# Patient Record
Sex: Male | Born: 1945
Health system: Southern US, Community
[De-identification: ages and names within clinical notes are randomized; demographics above are authoritative.]

## PROBLEM LIST (undated history)

## (undated) DIAGNOSIS — I10 Essential (primary) hypertension: Secondary | ICD-10-CM

## (undated) DIAGNOSIS — D649 Anemia, unspecified: Secondary | ICD-10-CM

## (undated) DIAGNOSIS — D61818 Other pancytopenia: Secondary | ICD-10-CM

## (undated) DIAGNOSIS — R748 Abnormal levels of other serum enzymes: Secondary | ICD-10-CM

## (undated) DIAGNOSIS — D696 Thrombocytopenia, unspecified: Secondary | ICD-10-CM

## (undated) DIAGNOSIS — Z8781 Personal history of (healed) traumatic fracture: Secondary | ICD-10-CM

## (undated) DIAGNOSIS — N183 Chronic kidney disease, stage 3 unspecified: Secondary | ICD-10-CM

## (undated) DIAGNOSIS — C801 Malignant (primary) neoplasm, unspecified: Secondary | ICD-10-CM

## (undated) DIAGNOSIS — F4310 Post-traumatic stress disorder, unspecified: Secondary | ICD-10-CM

## (undated) HISTORY — DX: Personal history of (healed) traumatic fracture: Z87.81

## (undated) HISTORY — DX: Abnormal levels of other serum enzymes: R74.8

## (undated) HISTORY — DX: Other pancytopenia: D61.818

## (undated) HISTORY — DX: Anemia, unspecified: D64.9

## (undated) HISTORY — DX: Post-traumatic stress disorder, unspecified: F43.10

## (undated) HISTORY — DX: Thrombocytopenia, unspecified: D69.6

## (undated) HISTORY — PX: NO PAST SURGERIES: SHX2092

---

## 2009-09-19 ENCOUNTER — Inpatient Hospital Stay: Payer: Self-pay | Admitting: Internal Medicine

## 2012-07-29 LAB — BASIC METABOLIC PANEL
Anion Gap: 11 (ref 7–16)
BUN: 10 mg/dL (ref 7–18)
Chloride: 101 mmol/L (ref 98–107)
Co2: 22 mmol/L (ref 21–32)
Creatinine: 1.12 mg/dL (ref 0.60–1.30)
EGFR (Non-African Amer.): >60
Sodium: 134 mmol/L — ABNORMAL LOW (ref 136–145)

## 2012-07-29 LAB — CBC
HCT: 43.9 % (ref 40.0–52.0)
MCV: 79 fL — ABNORMAL LOW (ref 80–100)
Platelet: 183 10*3/uL (ref 150–440)
RBC: 5.57 10*6/uL (ref 4.40–5.90)
RDW: 16.3 % — ABNORMAL HIGH (ref 11.5–14.5)
WBC: 6 10*3/uL (ref 3.8–10.6)

## 2012-07-29 LAB — CK: CK, Total: 243 U/L — ABNORMAL HIGH (ref 35–232)

## 2012-07-30 ENCOUNTER — Observation Stay: Payer: Self-pay | Admitting: Surgery

## 2012-08-08 ENCOUNTER — Ambulatory Visit: Payer: Self-pay | Admitting: Surgery

## 2014-01-04 ENCOUNTER — Emergency Department: Payer: Self-pay | Admitting: Emergency Medicine

## 2014-01-04 LAB — URINALYSIS, COMPLETE
BACTERIA: NONE SEEN
BILIRUBIN, UR: NEGATIVE
BLOOD: NEGATIVE
GLUCOSE, UR: NEGATIVE mg/dL (ref 0–75)
Hyaline Cast: 1
Ketone: NEGATIVE
Leukocyte Esterase: NEGATIVE
Nitrite: NEGATIVE
Ph: 6 (ref 4.5–8.0)
Protein: NEGATIVE
Specific Gravity: 1.015 (ref 1.003–1.030)
Squamous Epithelial: <1

## 2014-01-04 LAB — COMPREHENSIVE METABOLIC PANEL
Albumin: 3.7 g/dL (ref 3.4–5.0)
Alkaline Phosphatase: 166 U/L — ABNORMAL HIGH
Anion Gap: 2 — ABNORMAL LOW (ref 7–16)
BUN: 10 mg/dL (ref 7–18)
Bilirubin,Total: 0.4 mg/dL (ref 0.2–1.0)
Calcium, Total: 8.3 mg/dL — ABNORMAL LOW (ref 8.5–10.1)
Chloride: 105 mmol/L (ref 98–107)
Co2: 27 mmol/L (ref 21–32)
Creatinine: 1.29 mg/dL (ref 0.60–1.30)
EGFR (African American): >60
EGFR (Non-African Amer.): 57 — ABNORMAL LOW
GLUCOSE: 89 mg/dL (ref 65–99)
Osmolality: 267 (ref 275–301)
Potassium: 4.9 mmol/L (ref 3.5–5.1)
SGOT(AST): 16 U/L (ref 15–37)
SGPT (ALT): 21 U/L (ref 12–78)
Sodium: 134 mmol/L — ABNORMAL LOW (ref 136–145)
Total Protein: 7 g/dL (ref 6.4–8.2)

## 2014-01-04 LAB — CBC WITH DIFFERENTIAL/PLATELET
BASOS PCT: 0.6 %
Basophil #: 0 10*3/uL (ref 0.0–0.1)
Eosinophil #: 0 10*3/uL (ref 0.0–0.7)
Eosinophil %: 1.2 %
HCT: 36.6 % — ABNORMAL LOW (ref 40.0–52.0)
HGB: 12.1 g/dL — ABNORMAL LOW (ref 13.0–18.0)
LYMPHS PCT: 40 %
Lymphocyte #: 1.3 10*3/uL (ref 1.0–3.6)
MCH: 26 pg (ref 26.0–34.0)
MCHC: 32.9 g/dL (ref 32.0–36.0)
MCV: 79 fL — ABNORMAL LOW (ref 80–100)
Monocyte #: 0.3 x10 3/mm (ref 0.2–1.0)
Monocyte %: 8.4 %
Neutrophil #: 1.7 10*3/uL (ref 1.4–6.5)
Neutrophil %: 49.8 %
Platelet: 138 10*3/uL — ABNORMAL LOW (ref 150–440)
RBC: 4.64 10*6/uL (ref 4.40–5.90)
RDW: 16 % — ABNORMAL HIGH (ref 11.5–14.5)
WBC: 3.4 10*3/uL — AB (ref 3.8–10.6)

## 2014-01-04 LAB — LIPASE, BLOOD: Lipase: 88 U/L (ref 73–393)

## 2015-02-20 DIAGNOSIS — I129 Hypertensive chronic kidney disease with stage 1 through stage 4 chronic kidney disease, or unspecified chronic kidney disease: Secondary | ICD-10-CM | POA: Diagnosis not present

## 2015-02-20 DIAGNOSIS — F17290 Nicotine dependence, other tobacco product, uncomplicated: Secondary | ICD-10-CM | POA: Diagnosis not present

## 2015-02-23 ENCOUNTER — Ambulatory Visit: Payer: Self-pay | Admitting: Family Medicine

## 2015-02-23 DIAGNOSIS — F17210 Nicotine dependence, cigarettes, uncomplicated: Secondary | ICD-10-CM | POA: Diagnosis not present

## 2015-02-23 DIAGNOSIS — I700 Atherosclerosis of aorta: Secondary | ICD-10-CM | POA: Diagnosis not present

## 2015-02-23 DIAGNOSIS — J9809 Other diseases of bronchus, not elsewhere classified: Secondary | ICD-10-CM | POA: Diagnosis not present

## 2015-02-23 DIAGNOSIS — J40 Bronchitis, not specified as acute or chronic: Secondary | ICD-10-CM | POA: Diagnosis not present

## 2015-02-23 DIAGNOSIS — E871 Hypo-osmolality and hyponatremia: Secondary | ICD-10-CM | POA: Diagnosis not present

## 2015-03-03 ENCOUNTER — Ambulatory Visit: Payer: Self-pay

## 2015-03-03 DIAGNOSIS — E871 Hypo-osmolality and hyponatremia: Secondary | ICD-10-CM | POA: Diagnosis not present

## 2015-03-03 DIAGNOSIS — F17290 Nicotine dependence, other tobacco product, uncomplicated: Secondary | ICD-10-CM | POA: Diagnosis not present

## 2015-03-03 DIAGNOSIS — Z87891 Personal history of nicotine dependence: Secondary | ICD-10-CM | POA: Diagnosis not present

## 2015-03-03 DIAGNOSIS — J439 Emphysema, unspecified: Secondary | ICD-10-CM | POA: Diagnosis not present

## 2015-03-05 ENCOUNTER — Ambulatory Visit
Admit: 2015-03-05 | Disposition: A | Discharge status: Home / Self Care | Payer: Self-pay | Attending: Internal Medicine | Admitting: Internal Medicine

## 2015-03-05 DIAGNOSIS — N189 Chronic kidney disease, unspecified: Secondary | ICD-10-CM | POA: Diagnosis not present

## 2015-03-05 DIAGNOSIS — R748 Abnormal levels of other serum enzymes: Secondary | ICD-10-CM | POA: Diagnosis not present

## 2015-03-05 DIAGNOSIS — F4310 Post-traumatic stress disorder, unspecified: Secondary | ICD-10-CM | POA: Diagnosis not present

## 2015-03-05 DIAGNOSIS — Z7982 Long term (current) use of aspirin: Secondary | ICD-10-CM | POA: Diagnosis not present

## 2015-03-05 DIAGNOSIS — R7989 Other specified abnormal findings of blood chemistry: Secondary | ICD-10-CM | POA: Diagnosis not present

## 2015-03-05 DIAGNOSIS — D61818 Other pancytopenia: Secondary | ICD-10-CM | POA: Diagnosis not present

## 2015-03-05 DIAGNOSIS — D649 Anemia, unspecified: Secondary | ICD-10-CM | POA: Diagnosis not present

## 2015-03-05 DIAGNOSIS — R6889 Other general symptoms and signs: Secondary | ICD-10-CM | POA: Diagnosis not present

## 2015-03-05 DIAGNOSIS — D696 Thrombocytopenia, unspecified: Secondary | ICD-10-CM | POA: Diagnosis not present

## 2015-03-05 DIAGNOSIS — R918 Other nonspecific abnormal finding of lung field: Secondary | ICD-10-CM | POA: Diagnosis not present

## 2015-03-16 DIAGNOSIS — N189 Chronic kidney disease, unspecified: Secondary | ICD-10-CM | POA: Diagnosis not present

## 2015-03-16 DIAGNOSIS — F4310 Post-traumatic stress disorder, unspecified: Secondary | ICD-10-CM | POA: Diagnosis not present

## 2015-03-16 DIAGNOSIS — D696 Thrombocytopenia, unspecified: Secondary | ICD-10-CM | POA: Diagnosis not present

## 2015-03-16 DIAGNOSIS — R918 Other nonspecific abnormal finding of lung field: Secondary | ICD-10-CM | POA: Diagnosis not present

## 2015-03-16 DIAGNOSIS — R7989 Other specified abnormal findings of blood chemistry: Secondary | ICD-10-CM | POA: Diagnosis not present

## 2015-03-16 DIAGNOSIS — D649 Anemia, unspecified: Secondary | ICD-10-CM | POA: Diagnosis not present

## 2015-03-16 DIAGNOSIS — D61818 Other pancytopenia: Secondary | ICD-10-CM | POA: Diagnosis not present

## 2015-03-16 DIAGNOSIS — R748 Abnormal levels of other serum enzymes: Secondary | ICD-10-CM | POA: Diagnosis not present

## 2015-03-16 DIAGNOSIS — Z7982 Long term (current) use of aspirin: Secondary | ICD-10-CM | POA: Diagnosis not present

## 2015-03-16 LAB — CBC CANCER CENTER
BASOS PCT: 0.3 %
Basophil #: 0 x10 3/mm (ref 0.0–0.1)
Eosinophil #: 0 x10 3/mm (ref 0.0–0.7)
Eosinophil %: 0.4 %
HCT: 38.6 % — AB (ref 40.0–52.0)
HGB: 12.9 g/dL — AB (ref 13.0–18.0)
LYMPHS ABS: 1 x10 3/mm (ref 1.0–3.6)
Lymphocyte %: 39.5 %
MCH: 26.2 pg (ref 26.0–34.0)
MCHC: 33.4 g/dL (ref 32.0–36.0)
MCV: 78 fL — AB (ref 80–100)
Monocyte #: 0.3 x10 3/mm (ref 0.2–1.0)
Monocyte %: 10.6 %
Neutrophil #: 1.2 x10 3/mm — ABNORMAL LOW (ref 1.4–6.5)
Neutrophil %: 49.2 %
Platelet: 142 x10 3/mm — ABNORMAL LOW (ref 150–440)
RBC: 4.92 10*6/uL (ref 4.40–5.90)
RDW: 17 % — AB (ref 11.5–14.5)
WBC: 2.5 x10 3/mm — AB (ref 3.8–10.6)

## 2015-03-16 LAB — CREATININE, SERUM
Creatine, Serum: 1.03
Creatinine: 1.03 mg/dL
EGFR (Non-African Amer.): >60

## 2015-03-17 LAB — KAPPA/LAMBDA FREE LIGHT CHAINS (ARMC)

## 2015-03-17 LAB — PROT IMMUNOELECTROPHORES(ARMC)

## 2015-03-20 ENCOUNTER — Ambulatory Visit
Admit: 2015-03-20 | Disposition: A | Discharge status: Home / Self Care | Payer: Self-pay | Attending: Internal Medicine | Admitting: Internal Medicine

## 2015-04-07 NOTE — H&P (Signed)
History of Present Illness 66 yobm helping a D/A person who fell and pt fell too - over some bricks, hurting his left chest. Breathing fine, no dyspnea. No LOC / amnesia.   Past Med/Surgical Hx:  GERD - Esophageal Reflux:   Hypercholesterolemia:   HTN:   rib removed:   Carpal Tunnel Release: BL arm  ALLERGIES:  Ace Inhibitors: Angioedema  HOME MEDICATIONS: Medication Instructions Status  Norvasc 10 mg oral tablet 0.5   once a day  Active  hydrochlorothiazide 25 mg oral tablet 1     Active  etodolac 400 mg oral tablet 1     Active  aspirin 81 mg oral tablet 1 tab(s) orally once a day Active   Family and Social History:   Family History Non-Contributory    Social History positive tobacco (Greater than 1 year), positive ETOH, single, retired Company secretary, smokes 50 swisher sweets / day and drinks a 6 pack of beer at a time approximately biw    Place of Living Home   Review of Systems:   Fever/Chills No    Cough No    Sputum No    Abdominal Pain No    Diarrhea No    Constipation No    Nausea/Vomiting No    SOB/DOE No    Chest Pain Yes    Dysuria No    Tolerating PT No    Tolerating Diet Yes    Medications/Allergies Reviewed Medications/Allergies reviewed   Physical Exam:   GEN well developed, well nourished    HEENT pink conjunctivae, PERRL, hearing intact to voice, moist oral mucosa, Oropharynx clear, poor dentition    NECK supple  trachea midline    RESP normal resp effort  clear BS  no use of accessory muscles    CARD regular rate  no murmur  no JVD    ABD denies tenderness  soft    SKIN normal to palpation, skin turgor good    NEURO cranial nerves intact, follows commands, motor/sensory function intact    PSYCH alert, A+O to time, place, person   Lab Results: Routine Chem:  11-Aug-13 23:33    Ethanol, S. 212   Ethanol % (comp)  0.212 (Result(s) reported on 29 Jul 2012 at 11:55PM.)   Glucose, Serum 94   BUN 10   Creatinine  (comp) 1.12   Sodium, Serum  134   Potassium, Serum 3.7   Chloride, Serum 101   CO2, Serum 22   Calcium (Total), Serum 8.6   Anion Gap 11   Osmolality (calc) 267   eGFR (African American) >60   eGFR (Non-African American) >60 (eGFR values <46mL/min/1.73 m2 may be an indication of chronic kidney disease (CKD). Calculated eGFR is useful in patients with stable renal function. The eGFR calculation will not be reliable in acutely ill patients when serum creatinine is changing rapidly. It is not useful in  patients on dialysis. The eGFR calculation may not be applicable to patients at the low and high extremes of body sizes, pregnant women, and vegetarians.)   Result Comment potassium - Slight hemolysis, interpret results with  - caution.  Result(s) reported on 29 Jul 2012 at 11:55PM.  Cardiac:  11-Aug-13 23:33    CK, Total  243 (Result(s) reported on 29 Jul 2012 at 11:55PM.)  Routine Hem:  11-Aug-13 23:33    WBC (CBC) 6.0   RBC (CBC) 5.57   Hemoglobin (CBC) 14.5   Hematocrit (CBC) 43.9   Platelet Count (CBC) 183 (Result(s)  reported on 29 Jul 2012 at 11:51PM.)   MCV  79   MCH 26.0   MCHC 33.0   RDW  16.3   Radiology Results: XRay:    11-Aug-13 21:59, Ribs Left Unilateral   Ribs Left Unilateral   REASON FOR EXAM:    trauma, left rib pain  COMMENTS:       PROCEDURE: DXR - DXR RIBS LEFT UNILATERAL  - Jul 29 2012  9:59PM     RESULT: Left rib images show incorrectly marked film with right marker   present on 2 of the images.    There are multipleleft rib fractures including the fourth through   seventh ribs inclusive. No significant distraction or angulation is seen.   No pneumothorax is evident. No subcutaneous emphysema is demonstrated.    IMPRESSION:   1. Multiple posterior and lateral left rib fractures.    Dictation Site: 6          Verified By: Sundra Aland, M.D., MD     Assessment/Admission Diagnosis L rib Fxs Binge alcohol Hx smoker    Plan Admit  for pain control and pulmonay toilet (IS) Beer bid for DT prophylaxis   Electronic Signatures: Consuela Mimes (MD)  (Signed 12-Aug-13 00:41)  Authored: CHIEF COMPLAINT and HISTORY, PAST MEDICAL/SURGIAL HISTORY, ALLERGIES, HOME MEDICATIONS, FAMILY AND SOCIAL HISTORY, REVIEW OF SYSTEMS, PHYSICAL EXAM, LABS, Radiology, ASSESSMENT AND PLAN   Last Updated: 12-Aug-13 00:41 by Consuela Mimes (MD)

## 2015-04-10 DIAGNOSIS — F17210 Nicotine dependence, cigarettes, uncomplicated: Secondary | ICD-10-CM | POA: Diagnosis not present

## 2015-04-10 DIAGNOSIS — R7989 Other specified abnormal findings of blood chemistry: Secondary | ICD-10-CM | POA: Diagnosis not present

## 2015-04-10 DIAGNOSIS — D649 Anemia, unspecified: Secondary | ICD-10-CM | POA: Diagnosis not present

## 2015-04-10 DIAGNOSIS — D696 Thrombocytopenia, unspecified: Secondary | ICD-10-CM | POA: Diagnosis not present

## 2015-04-10 DIAGNOSIS — Z79899 Other long term (current) drug therapy: Secondary | ICD-10-CM | POA: Diagnosis not present

## 2015-04-10 DIAGNOSIS — Z7982 Long term (current) use of aspirin: Secondary | ICD-10-CM | POA: Diagnosis not present

## 2015-04-10 DIAGNOSIS — M129 Arthropathy, unspecified: Secondary | ICD-10-CM | POA: Diagnosis not present

## 2015-04-10 DIAGNOSIS — N189 Chronic kidney disease, unspecified: Secondary | ICD-10-CM | POA: Diagnosis not present

## 2015-04-10 DIAGNOSIS — M2550 Pain in unspecified joint: Secondary | ICD-10-CM | POA: Diagnosis not present

## 2015-04-10 LAB — CBC CANCER CENTER
BASOS ABS: 0 x10 3/mm (ref 0.0–0.1)
Basophil %: 0.5 %
EOS ABS: 0 x10 3/mm (ref 0.0–0.7)
Eosinophil %: 0.7 %
HCT: 36.9 % — ABNORMAL LOW (ref 40.0–52.0)
HGB: 12.3 g/dL — AB (ref 13.0–18.0)
Lymphocyte #: 1 x10 3/mm (ref 1.0–3.6)
Lymphocyte %: 37.7 %
MCH: 26.2 pg (ref 26.0–34.0)
MCHC: 33.4 g/dL (ref 32.0–36.0)
MCV: 78 fL — AB (ref 80–100)
MONO ABS: 0.5 x10 3/mm (ref 0.2–1.0)
Monocyte %: 17.4 %
Neutrophil #: 1.2 x10 3/mm — ABNORMAL LOW (ref 1.4–6.5)
Neutrophil %: 43.7 %
PLATELETS: 128 x10 3/mm — AB (ref 150–440)
RBC: 4.71 10*6/uL (ref 4.40–5.90)
RDW: 16.8 % — AB (ref 11.5–14.5)
WBC: 2.8 x10 3/mm — ABNORMAL LOW (ref 3.8–10.6)

## 2015-04-15 DIAGNOSIS — F17210 Nicotine dependence, cigarettes, uncomplicated: Secondary | ICD-10-CM | POA: Diagnosis not present

## 2015-04-15 DIAGNOSIS — Z7982 Long term (current) use of aspirin: Secondary | ICD-10-CM | POA: Diagnosis not present

## 2015-04-15 DIAGNOSIS — M129 Arthropathy, unspecified: Secondary | ICD-10-CM | POA: Diagnosis not present

## 2015-04-15 DIAGNOSIS — N189 Chronic kidney disease, unspecified: Secondary | ICD-10-CM | POA: Diagnosis not present

## 2015-04-15 DIAGNOSIS — Z79899 Other long term (current) drug therapy: Secondary | ICD-10-CM | POA: Diagnosis not present

## 2015-04-15 DIAGNOSIS — K7689 Other specified diseases of liver: Secondary | ICD-10-CM | POA: Diagnosis not present

## 2015-04-15 DIAGNOSIS — R7989 Other specified abnormal findings of blood chemistry: Secondary | ICD-10-CM | POA: Diagnosis not present

## 2015-04-15 DIAGNOSIS — D696 Thrombocytopenia, unspecified: Secondary | ICD-10-CM | POA: Diagnosis not present

## 2015-04-15 DIAGNOSIS — M2550 Pain in unspecified joint: Secondary | ICD-10-CM | POA: Diagnosis not present

## 2015-04-15 DIAGNOSIS — D649 Anemia, unspecified: Secondary | ICD-10-CM | POA: Diagnosis not present

## 2015-04-24 ENCOUNTER — Inpatient Hospital Stay (HOSPITAL_BASED_OUTPATIENT_CLINIC_OR_DEPARTMENT_OTHER): Payer: Commercial Managed Care - HMO | Admitting: Internal Medicine

## 2015-04-24 ENCOUNTER — Inpatient Hospital Stay: Payer: Commercial Managed Care - HMO | Attending: Internal Medicine

## 2015-04-24 VITALS — BP 150/73 | HR 54 | Resp 18 | Ht 71.0 in | Wt 151.9 lb

## 2015-04-24 DIAGNOSIS — Z87891 Personal history of nicotine dependence: Secondary | ICD-10-CM | POA: Diagnosis not present

## 2015-04-24 DIAGNOSIS — K700 Alcoholic fatty liver: Secondary | ICD-10-CM

## 2015-04-24 DIAGNOSIS — D61818 Other pancytopenia: Secondary | ICD-10-CM | POA: Insufficient documentation

## 2015-04-24 DIAGNOSIS — F1010 Alcohol abuse, uncomplicated: Secondary | ICD-10-CM

## 2015-04-24 DIAGNOSIS — D696 Thrombocytopenia, unspecified: Secondary | ICD-10-CM

## 2015-04-24 DIAGNOSIS — D472 Monoclonal gammopathy: Secondary | ICD-10-CM

## 2015-04-24 DIAGNOSIS — Z79899 Other long term (current) drug therapy: Secondary | ICD-10-CM | POA: Insufficient documentation

## 2015-04-24 DIAGNOSIS — D649 Anemia, unspecified: Secondary | ICD-10-CM

## 2015-04-24 DIAGNOSIS — I10 Essential (primary) hypertension: Secondary | ICD-10-CM | POA: Diagnosis not present

## 2015-04-24 LAB — CBC WITH DIFFERENTIAL/PLATELET
Basophils Absolute: 0 10*3/uL (ref 0–0.1)
Basophils Relative: 1 %
EOS PCT: 0 %
Eosinophils Absolute: 0 10*3/uL (ref 0–0.7)
HEMATOCRIT: 36.2 % — AB (ref 40.0–52.0)
Hemoglobin: 11.9 g/dL — ABNORMAL LOW (ref 13.0–18.0)
Lymphocytes Relative: 34 %
Lymphs Abs: 0.7 10*3/uL — ABNORMAL LOW (ref 1.0–3.6)
MCH: 26.2 pg (ref 26.0–34.0)
MCHC: 33 g/dL (ref 32.0–36.0)
MCV: 79.6 fL — ABNORMAL LOW (ref 80.0–100.0)
MONOS PCT: 13 %
Monocytes Absolute: 0.3 10*3/uL (ref 0.2–1.0)
Neutro Abs: 1.2 10*3/uL — ABNORMAL LOW (ref 1.4–6.5)
Neutrophils Relative %: 52 %
Platelets: 141 10*3/uL — ABNORMAL LOW (ref 150–440)
RBC: 4.54 MIL/uL (ref 4.40–5.90)
RDW: 16.5 % — ABNORMAL HIGH (ref 11.5–14.5)
WBC: 2.2 10*3/uL — ABNORMAL LOW (ref 3.8–10.6)

## 2015-04-24 LAB — CREATININE, SERUM
CREATININE: 1.35 mg/dL — AB (ref 0.61–1.24)
GFR calc Af Amer: >60 mL/min (ref >60)
GFR calc non Af Amer: 53 mL/min — ABNORMAL LOW (ref >60)

## 2015-04-24 NOTE — Progress Notes (Signed)
Cancer Center Progress Note  [Authored: 22-Apr-16 23:55]- for Visit: [id number], Complete, Entered, Signed in Full, General  HPI: Referred by Dr. Julian Hy.   This 69 year old Male patient presents to the clinic for follow up (1)Low wbc and platelet count(1)  Chief Complaint:  [place] Patient (1)   Presenting Problem Patient does not offer any problems or concerns today.(1)   Negative Symptoms fever, chills, anorexia, weakness, nausea, vomiting, diarrhea, constipation, cough, shortness of breath, palpitations, burning with urination, urinary frequency, headache, numbness/tingling, back pain, hip pain, rash(1)   Subjective: Chief Complaint/Diagnosis:   1. PANCYTOPENIA, MILD ANEMIA, WITH LOW MCV SINCE AT LEAST 02/2014, MILD THROMBOCYTOPENIA 148K ON 02/20/15, AND NEUTROPENIA, SINCE AT LEAST 07/2014, WHEN WBC 3.3 NO DIFF, ,  02/20/15 NEUTROPHILS 1.1, LYMPHS 1.1     2. ELEVATED ALKALINE PHOSPHATASE, ALSO WILL BE FOLLOWED BY PMD, LIKELY DUE TO SPONYLOARTHROPATHY AS NOTED ON CT CHEST   3. 50 PACK YR HX SMOKING SO HIGH RISK LUNG CANCER, JUST HAD CT CHEST 03/05/15, STABLE BENIGN APPEARING NODULES, APPROPRIATE TO RECOMMEND SCREENING CT YEARLY  4. AZOTEMIA FOLLOWED BY NEPHROLOGY FOR AT LEAST 1 YR WHICH CAN CONTRIBUTE TO ANEMIA 5.MONOCLONAL GAMMOPATHY 1.2G IGG HPI:   INITIAL VISIT WAS 03/05/15  TO EVALUATE PANCYTOPENIA, PATIENT ON 3/4 SAW PMD TO F/U BP, AND CHRONIC KIDNEY DISEASE. HE HAS NO RECENT SUBJECTIVE CHANGES TO HIS HEALTH. HE JUST HAD A CT CHEST NON CONTRAST  TO F/U ABNORMAL CXR, THERE IS NO ACUTE PATHOLOGY. ANEMIA PRESENT SINCE AT LEAST 02/2014, HAS WAX AND WANE AS PER RESULTS FROM NEHPROLOGY. [place] ALSO DOCUMENTED AT LEAST SINCE 07/2014 ALTHOUGH NO DIFFERENTIAL COUNT FROM THAT TIME. PLTS ONLY ONE PRIOR LOW RESULT AS ABOVE, 148 K ON 02/20/15. NO BLEEDING OR BRUISING. SINCE INITIAL VISIT, IRON STUDIES NORMAL, B12 BORDERLINE, HIV AND HEP C NEG, NO ACUTE COMPLAINTS, SEE ALSO IMP AND PLAN,..TODAY.Marland KitchenADMMITS TO 2  TO 3 BEERS PER DAY AND 6 PACK FRI SAT SUN   Review of Systems:  General: denies complaints  HEENT: no complaints  Lungs: no complaints  Cardiac: no complaints  GI: no complaints  GU: no complaints  Musculoskeletal: joint pain  mild stable arthritis  Extremities: no complaints  Skin: no complaints  Neuro: some hearing loss  Psych: no complaints  hx ptsd follows at [place]   Allergies:  Ace Inhibitors: Angioedema  Significant History/PMH:   GERD - Esophageal Reflux:    Hypercholesterolemia:    HTN:    rib removed:    Carpal Tunnel Release: BL arm  Preventive Screening:  Has patient had any of the following test? Colonscopy  Prostate Exam (1)   Last Colonoscopy: In 2015(1)   Last Prostate Exam: In 2015(1)   Smoking History: Smoking History 1(1)Packs per day and Has been smoking for 50 years and is not interested in quitting.Marland Kitchen.(1)  PFSH: Comments: MOTHER BREAST CANCER FATHER COLON AND PROSTATE  Comment  NOW ADMITS TO REGULAR HEAVY ALCOHOL USE NOT INTERESTED IN REDUCING OR QUITTING  Additional Past Medical and Surgical History: RIB AND CLAVICLE FX, [facility]   Home Medications: Medication Instructions Last Modified Date/Time  metoprolol 25 mg oral tablet 1 tab(s) orally 2 times a day 22-Apr-16 14:10  aspirin 81 mg oral tablet 1 tab(s) orally once a day 22-Apr-16 14:10  cloNIDine 0.2 mg oral tablet 1 tab(s) orally 2 times a day 22-Apr-16 14:10  hydrALAZINE 25 mg oral tablet 1 tab(s) orally 2 times a day 22-Apr-16 14:10  chlorthalidone 25 mg oral tablet 1 tab(s)  orally once a day 22-Apr-16 14:10   Vital Signs:  ::   Physical Exam:  General: well developed, well nourished, and no acute distress  Mental Status: alert and oriented to person, place and time  Head, Ears, Nose,Throat: No thrush  Respiratory: no rales, rhonchi, or wheezing  Cardiovascular: regular rate and rhythm  Gastrointestinal: soft, non tender, no masses or organomegaly  Musculoskeletal:  no lower extremity edema no calf tenderness  Skin: no rashes, no bruising  Neurological: No gross focal weakness cranial nerves intact  Lymphatics: NOT REPEATED BUT ON 3/28 WAS NEG IN NECK SUPRACLAVICULAR SUBMANDIBULAR AXILLA  Psych: MOOD AFFECT UNREMARKABLE   Laboratory Results: Routine Hem:  22-Apr-16 13:54            5/6 SEE LABS  WBC (CBC)  2.8  RBC (CBC) 4.71  Hemoglobin (CBC)  12.3  Hematocrit (CBC)  36.9  Platelet Count (CBC)  128  MCV  78  MCH 26.2  MCHC 33.4  RDW  16.8  Neutrophil % 43.7  Lymphocyte % 37.7  Monocyte % 17.4  Eosinophil % 0.7  Basophil % 0.5  Neutrophil #  1.2  Lymphocyte # 1.0  Monocyte # 0.5  Eosinophil # 0.0  Basophil # 0.0 (Result(s) reported on 10 Apr 2015 at 02:05PM.)   Lab Results Review:  Lab Results   FROM PMD 02/20/15 WBC 2.5, LYMPH 1.1, POLY 1.1, PLT 148, MCV 78, HGB 12.4, ALK PHOS 176, CRE 1.27, CA 8.8, TP 6.9, TSH 1.23.Marland KitchenON 02/26/14, ALK PHOS 192, CRE 1.25 LFT NORMAL  Assessment and Plan: Impression:   1. PANCYTOPENIA, OR AT LEAST ANEMIA AND THROMBOCYTOENIA, MILD,  ETIOLOGY UNKNOWN POSSIBLE MDS OR HYPERSPLENISM,  HIV AND HEP C NORMAL, ,NOW RA NEG, ANA NOT DONE. ,  2 CONSIDER, WATCH TO R/O CYCLIC NEUTROPENIA, AS POLYS NORMAL ON 03/05/15,  3. HAVE CHECKED IRON STUDIES, NORMAL, MCV IS BORDERLINE LOW 4. B12 BORDERLINE NORMAL  5.  NOTE ADDITIONAL MEDS FOR PTSD, AS PER PATIENT FOR MOOD AND TO PREVENT NIGHTMARES, RECENTLY PRESCRIBED BUT POST DATE FIRST LOW BLOOD ,6. SIEP POS IGG M SPIKE OF $Remove'1200MG'[id number]$ , LT CHAINS MODEST HIGH, RA NEG, POLYS 1200, HGB AND P;TS STABLE STILL LOW...NOW, U/S FTTY LIVER NO SPLENOMEGALY, NOW CLARLY ETOH MOST LIKELY CAUSE OF PANCYTOPENIA. CBC STABLE, CRE SOME WAX AND WANE  Plan:       ADD SEROLOGIES, ANA, , WATCH MCV, MY REPEAT B12 AND IRON STUDIES LATER,  , POSSIBLY LATER F/U ALK PHOS. WILL RECHECK FH, AGES OF FAMILY ILLNESS,.NO ROLE FOR BM. F/U M SPIKE  Advance Directive:  Advance Directive ([place]) no(1)   Advance  Directive Information Given patient refused(1)   Electronic Signatures: Dallas Schimke (MD)  (Signed 24-Apr-16 00:00)  Authored: History of Present Illness, CC/HPI, Review of Systems, ALLERGIES, PAST MEDICAL HISTORY, Preventive Screening, Smoking Cessation, Patient Family Social History, HOME MEDICATIONS, Vital Signs, Physical Exam, Lab Results Review, Assessment and Plan, Advance Directive   Last Updated: 24-Apr-16 00:00 by Dallas Schimke (MD)  References: 1.  Data Referenced From "[place] Office Nurse Note" 04/10/2015 2:09 PM

## 2015-04-26 LAB — KAPPA/LAMBDA LIGHT CHAINS
Kappa free light chain: 122.88 mg/L — ABNORMAL HIGH (ref 3.30–19.40)
Kappa, lambda light chain ratio: 6.72 — ABNORMAL HIGH (ref 0.26–1.65)
Lambda free light chains: 18.28 mg/L (ref 5.71–26.30)

## 2015-04-27 LAB — PROTEIN ELECTROPHORESIS, SERUM
A/G Ratio: 1.1 (ref 0.7–2.0)
ALPHA-2-GLOBULIN: 0.7 g/dL (ref 0.4–1.2)
Albumin ELP: 3.4 g/dL (ref 3.2–5.6)
Alpha-1-Globulin: 0.2 g/dL (ref 0.1–0.4)
Beta Globulin: 0.8 g/dL (ref 0.6–1.3)
Gamma Globulin: 1.3 g/dL (ref 0.5–1.6)
Globulin, Total: 3 g/dL (ref 2.0–4.5)
M-SPIKE, %: 1.1 g/dL — AB
Total Protein ELP: 6.4 g/dL (ref 6.0–8.5)

## 2015-05-01 DIAGNOSIS — N2581 Secondary hyperparathyroidism of renal origin: Secondary | ICD-10-CM | POA: Diagnosis not present

## 2015-05-01 DIAGNOSIS — N183 Chronic kidney disease, stage 3 (moderate): Secondary | ICD-10-CM | POA: Diagnosis not present

## 2015-05-01 DIAGNOSIS — I129 Hypertensive chronic kidney disease with stage 1 through stage 4 chronic kidney disease, or unspecified chronic kidney disease: Secondary | ICD-10-CM | POA: Diagnosis not present

## 2015-05-01 DIAGNOSIS — D631 Anemia in chronic kidney disease: Secondary | ICD-10-CM | POA: Diagnosis not present

## 2015-05-05 LAB — IMMUNOFIXATION ELECTROPHORESIS
IGA: 104 mg/dL (ref 61–437)
IgG (Immunoglobin G), Serum: 1392 mg/dL (ref 700–1600)
IgM, Serum: 22 mg/dL (ref 20–172)
Total Protein ELP: 6.7 g/dL (ref 6.0–8.5)

## 2015-05-13 LAB — MISC LABCORP TEST (SEND OUT)

## 2015-05-29 ENCOUNTER — Ambulatory Visit: Payer: Commercial Managed Care - HMO | Admitting: Internal Medicine

## 2015-05-29 ENCOUNTER — Other Ambulatory Visit: Payer: Commercial Managed Care - HMO

## 2015-06-04 ENCOUNTER — Inpatient Hospital Stay (HOSPITAL_BASED_OUTPATIENT_CLINIC_OR_DEPARTMENT_OTHER): Payer: Commercial Managed Care - HMO | Admitting: Oncology

## 2015-06-04 ENCOUNTER — Inpatient Hospital Stay: Payer: Commercial Managed Care - HMO | Attending: Internal Medicine

## 2015-06-04 VITALS — BP 198/86 | HR 46 | Temp 96.9°F | Resp 20 | Wt 149.2 lb

## 2015-06-04 DIAGNOSIS — Z79899 Other long term (current) drug therapy: Secondary | ICD-10-CM | POA: Insufficient documentation

## 2015-06-04 DIAGNOSIS — D61818 Other pancytopenia: Secondary | ICD-10-CM | POA: Insufficient documentation

## 2015-06-04 DIAGNOSIS — I10 Essential (primary) hypertension: Secondary | ICD-10-CM

## 2015-06-04 DIAGNOSIS — Z87891 Personal history of nicotine dependence: Secondary | ICD-10-CM | POA: Insufficient documentation

## 2015-06-04 DIAGNOSIS — F17210 Nicotine dependence, cigarettes, uncomplicated: Secondary | ICD-10-CM

## 2015-06-04 DIAGNOSIS — D696 Thrombocytopenia, unspecified: Secondary | ICD-10-CM | POA: Diagnosis not present

## 2015-06-04 DIAGNOSIS — D472 Monoclonal gammopathy: Secondary | ICD-10-CM

## 2015-06-04 LAB — CBC WITH DIFFERENTIAL/PLATELET
BASOS PCT: 0 %
Basophils Absolute: 0 10*3/uL (ref 0–0.1)
EOS ABS: 0 10*3/uL (ref 0–0.7)
Eosinophils Relative: 1 %
HCT: 37.1 % — ABNORMAL LOW (ref 40.0–52.0)
Hemoglobin: 11.9 g/dL — ABNORMAL LOW (ref 13.0–18.0)
Lymphocytes Relative: 44 %
Lymphs Abs: 0.9 10*3/uL — ABNORMAL LOW (ref 1.0–3.6)
MCH: 26.4 pg (ref 26.0–34.0)
MCHC: 32.2 g/dL (ref 32.0–36.0)
MCV: 81.8 fL (ref 80.0–100.0)
MONOS PCT: 16 %
Monocytes Absolute: 0.3 10*3/uL (ref 0.2–1.0)
NEUTROS PCT: 39 %
Neutro Abs: 0.8 10*3/uL — ABNORMAL LOW (ref 1.4–6.5)
PLATELETS: 122 10*3/uL — AB (ref 150–440)
RBC: 4.53 MIL/uL (ref 4.40–5.90)
RDW: 15.7 % — AB (ref 11.5–14.5)
WBC: 2 10*3/uL — ABNORMAL LOW (ref 3.8–10.6)

## 2015-06-04 LAB — CREATININE, SERUM
CREATININE: 1.08 mg/dL (ref 0.61–1.24)
GFR calc Af Amer: >60 mL/min (ref >60)
GFR calc non Af Amer: >60 mL/min (ref >60)

## 2015-06-05 LAB — IMMUNOFIXATION ELECTROPHORESIS
IgA: 109 mg/dL (ref 61–437)
IgG (Immunoglobin G), Serum: 1356 mg/dL (ref 700–1600)
IgM, Serum: 22 mg/dL (ref 20–172)
Total Protein ELP: 6.8 g/dL (ref 6.0–8.5)

## 2015-06-22 NOTE — Progress Notes (Signed)
[facility]  Telephone:([phone]) [phone] Fax:(336) [phone]  ID: [name] OB: 1946-08-04  MR#: [id number]  QXI#:[id number]  Patient Care Team: Guadalupe Maple, MD as PCP - General (Family Medicine) Guadalupe Maple, MD (Family Medicine)  CHIEF COMPLAINT:  Chief Complaint  Patient presents with  . Follow-up    pancytopenia/thrombocytopenia    INTERVAL HISTORY: Patient returns to clinic today for repeat laboratory work and further evaluation. He currently feels well and is asymptomatic. He denies any recent fevers. He denies any weakness or fatigue. He has no easy bleeding or bruising. He is a good appetite and denies weight loss. He denies any chest pain or shortness of breath. Patient feels at his baseline and offers no specific complaints today.  REVIEW OF SYSTEMS:   Review of Systems  Constitutional: Negative for fever and malaise/fatigue.  Respiratory: Negative.   Gastrointestinal: Negative.   Neurological: Negative for weakness.  Endo/Heme/Allergies: Does not bruise/bleed easily.    As per HPI. Otherwise, a complete review of systems is negatve.  PAST MEDICAL HISTORY: Past Medical History  Diagnosis Date  . Anemia   . Pancytopenia   . Thrombocytopenia   . Elevated alkaline phosphatase level   . History of fracture of rib   . History of fracture of clavicle   . PTSD (post-traumatic stress disorder)     FOLLOWED AT [facility]    PAST SURGICAL HISTORY: No past surgical history on file.  FAMILY HISTORY Family History  Problem Relation Age of Onset  . Breast cancer Mother   . Colon cancer Father   . Prostate cancer Father        ADVANCED DIRECTIVES:    HEALTH MAINTENANCE: History  Substance Use Topics  . Smoking status: Former Smoker -- 1.00 packs/day for 50 years    Types: Cigarettes  . Smokeless tobacco: Not on file     Comment: 49 PACK YR HX SMOKING SO HIGH RISK LUNG CANCER  . Alcohol Use: 0.0 oz/week    0 Standard drinks or  equivalent per week     Comment: OCC ALCOHOL DENIES REGULAR USE     Colonoscopy:  PAP:  Bone density:  Lipid panel:  Allergies  Allergen Reactions  . Ace Inhibitors     Other reaction(s): Angioedema    Current Outpatient Prescriptions  Medication Sig Dispense Refill  . aspirin 81 MG tablet Take 81 mg by mouth daily.    . chlorthalidone (HYGROTON) 25 MG tablet Take 25 mg by mouth daily.    . cloNIDine (CATAPRES) 0.2 MG tablet Take 0.2 mg by mouth 2 (two) times daily.    . hydrALAZINE (APRESOLINE) 25 MG tablet Take 25 mg by mouth 2 (two) times daily.    . metoprolol tartrate (LOPRESSOR) 25 MG tablet     . PARoxetine (PAXIL) 20 MG tablet Take 20 mg by mouth daily. 1/2 tab QHS    . prazosin (MINIPRESS) 2 MG capsule Take 2 mg by mouth at bedtime.     No current facility-administered medications for this visit.    OBJECTIVE: Filed Vitals:   06/04/15 1636  BP: 198/86  Pulse: 46  Temp: 96.9 F (36.1 C)  Resp: 20     Body mass index is 20.83 kg/(m^2).    ECOG FS:0 - Asymptomatic  General: Well-developed, well-nourished, no acute distress. Eyes: anicteric sclera. Lungs: Clear to auscultation bilaterally. Heart: Regular rate and rhythm. No rubs, murmurs, or gallops. Abdomen: Soft, nontender, nondistended. No organomegaly noted, normoactive bowel sounds. Musculoskeletal: No edema,  cyanosis, or clubbing. Neuro: Alert, answering all questions appropriately. Cranial nerves grossly intact. Skin: No rashes or petechiae noted. Psych: Normal affect.   LAB RESULTS:  Lab Results  Component Value Date   NA 134* 01/04/2014   K 4.9 01/04/2014   CL 105 01/04/2014   CO2 27 01/04/2014   GLUCOSE 89 01/04/2014   BUN 10 01/04/2014   CREATININE 1.08 06/04/2015   CALCIUM 8.3* 01/04/2014   PROT 7.0 01/04/2014   ALBUMIN 3.7 01/04/2014   AST 16 01/04/2014   ALT 21 01/04/2014   ALKPHOS 166* 01/04/2014   GFRNONAA >60 06/04/2015   GFRAA >60 06/04/2015    Lab Results  Component Value  Date   WBC 2.0* 06/04/2015   NEUTROABS 0.8* 06/04/2015   HGB 11.9* 06/04/2015   HCT 37.1* 06/04/2015   MCV 81.8 06/04/2015   PLT 122* 06/04/2015     STUDIES: No results found.  ASSESSMENT: Pancytopenia.  PLAN:    1. Pancytopenia: Previously, her entire workup was unrevealing. Etiology possibly secondary to MDS which would require bone marrow biopsy to diagnosis. Given that the patient is asymptomatic, this is not necessary at this point. No intervention is needed at this time. Return to clinic in 3 months with repeat laboratory work and further evaluation. 2. Hypertension: Patient's blood pressure is significantly elevated today.  Continue current medications, treatment per PCP.  Patient expressed understanding and was in agreement with this plan. He also understands that He can call clinic at any time with any questions, concerns, or complaints.    Lloyd Huger, MD   06/22/2015 12:43 PM

## 2015-06-26 DIAGNOSIS — N182 Chronic kidney disease, stage 2 (mild): Secondary | ICD-10-CM | POA: Diagnosis not present

## 2015-06-26 DIAGNOSIS — E871 Hypo-osmolality and hyponatremia: Secondary | ICD-10-CM | POA: Diagnosis not present

## 2015-06-26 DIAGNOSIS — I10 Essential (primary) hypertension: Secondary | ICD-10-CM | POA: Diagnosis not present

## 2015-07-31 ENCOUNTER — Encounter: Payer: Self-pay | Admitting: Emergency Medicine

## 2015-07-31 ENCOUNTER — Emergency Department: Payer: Commercial Managed Care - HMO

## 2015-07-31 ENCOUNTER — Other Ambulatory Visit: Payer: Self-pay

## 2015-07-31 ENCOUNTER — Emergency Department
Admission: EM | Admit: 2015-07-31 | Discharge: 2015-07-31 | Disposition: A | Discharge status: Home / Self Care | Payer: Commercial Managed Care - HMO | Attending: Emergency Medicine | Admitting: Emergency Medicine

## 2015-07-31 DIAGNOSIS — R220 Localized swelling, mass and lump, head: Secondary | ICD-10-CM | POA: Diagnosis not present

## 2015-07-31 DIAGNOSIS — E860 Dehydration: Secondary | ICD-10-CM | POA: Diagnosis not present

## 2015-07-31 DIAGNOSIS — J432 Centrilobular emphysema: Secondary | ICD-10-CM | POA: Diagnosis not present

## 2015-07-31 DIAGNOSIS — Z79899 Other long term (current) drug therapy: Secondary | ICD-10-CM | POA: Insufficient documentation

## 2015-07-31 DIAGNOSIS — Z720 Tobacco use: Secondary | ICD-10-CM | POA: Insufficient documentation

## 2015-07-31 DIAGNOSIS — R634 Abnormal weight loss: Secondary | ICD-10-CM | POA: Diagnosis not present

## 2015-07-31 DIAGNOSIS — R112 Nausea with vomiting, unspecified: Secondary | ICD-10-CM | POA: Diagnosis not present

## 2015-07-31 DIAGNOSIS — Z7982 Long term (current) use of aspirin: Secondary | ICD-10-CM | POA: Diagnosis not present

## 2015-07-31 DIAGNOSIS — R221 Localized swelling, mass and lump, neck: Secondary | ICD-10-CM | POA: Insufficient documentation

## 2015-07-31 DIAGNOSIS — F17210 Nicotine dependence, cigarettes, uncomplicated: Secondary | ICD-10-CM | POA: Diagnosis not present

## 2015-07-31 DIAGNOSIS — I159 Secondary hypertension, unspecified: Secondary | ICD-10-CM | POA: Insufficient documentation

## 2015-07-31 DIAGNOSIS — N400 Enlarged prostate without lower urinary tract symptoms: Secondary | ICD-10-CM | POA: Diagnosis not present

## 2015-07-31 HISTORY — DX: Essential (primary) hypertension: I10

## 2015-07-31 LAB — T4, FREE: Free T4: 0.74 ng/dL (ref 0.61–1.12)

## 2015-07-31 LAB — URINALYSIS COMPLETE WITH MICROSCOPIC (ARMC ONLY)
Bacteria, UA: NONE SEEN
Bilirubin Urine: NEGATIVE
Glucose, UA: NEGATIVE mg/dL
HGB URINE DIPSTICK: NEGATIVE
Ketones, ur: NEGATIVE mg/dL
Leukocytes, UA: NEGATIVE
Nitrite: NEGATIVE
Protein, ur: NEGATIVE mg/dL
SPECIFIC GRAVITY, URINE: 1.015 (ref 1.005–1.030)
pH: 6 (ref 5.0–8.0)

## 2015-07-31 LAB — CBC
HCT: 39.8 % — ABNORMAL LOW (ref 40.0–52.0)
Hemoglobin: 13.2 g/dL (ref 13.0–18.0)
MCH: 26.3 pg (ref 26.0–34.0)
MCHC: 33.2 g/dL (ref 32.0–36.0)
MCV: 79.2 fL — ABNORMAL LOW (ref 80.0–100.0)
Platelets: 115 K/uL — ABNORMAL LOW (ref 150–440)
RBC: 5.03 MIL/uL (ref 4.40–5.90)
RDW: 14.4 % (ref 11.5–14.5)
WBC: 4.9 K/uL (ref 3.8–10.6)

## 2015-07-31 LAB — COMPREHENSIVE METABOLIC PANEL WITH GFR
ALT: 87 U/L — ABNORMAL HIGH (ref 17–63)
AST: 84 U/L — ABNORMAL HIGH (ref 15–41)
Albumin: 3.2 g/dL — ABNORMAL LOW (ref 3.5–5.0)
Alkaline Phosphatase: 124 U/L (ref 38–126)
Anion gap: 8 (ref 5–15)
BUN: 17 mg/dL (ref 6–20)
CO2: 24 mmol/L (ref 22–32)
Calcium: 8.4 mg/dL — ABNORMAL LOW (ref 8.9–10.3)
Chloride: 99 mmol/L — ABNORMAL LOW (ref 101–111)
Creatinine, Ser: 1.46 mg/dL — ABNORMAL HIGH (ref 0.61–1.24)
GFR calc Af Amer: 55 mL/min — ABNORMAL LOW (ref >60)
GFR calc non Af Amer: 48 mL/min — ABNORMAL LOW (ref >60)
Glucose, Bld: 110 mg/dL — ABNORMAL HIGH (ref 65–99)
Potassium: 4.2 mmol/L (ref 3.5–5.1)
Sodium: 131 mmol/L — ABNORMAL LOW (ref 135–145)
Total Bilirubin: 0.9 mg/dL (ref 0.3–1.2)
Total Protein: 6.3 g/dL — ABNORMAL LOW (ref 6.5–8.1)

## 2015-07-31 LAB — LIPASE, BLOOD: Lipase: 16 U/L — ABNORMAL LOW (ref 22–51)

## 2015-07-31 LAB — TSH: TSH: 0.762 u[IU]/mL (ref 0.350–4.500)

## 2015-07-31 MED ORDER — ONDANSETRON HCL 4 MG PO TABS: 4.0000 mg | ORAL_TABLET | Freq: Every day | ORAL | Status: AC | PRN

## 2015-07-31 MED ORDER — ONDANSETRON HCL 4 MG/2ML IJ SOLN
4.0000 mg | Freq: Once | INTRAMUSCULAR | Status: AC
  Administered 2015-07-31: 4 mg via INTRAVENOUS
  Filled 2015-07-31: qty 2

## 2015-07-31 MED ORDER — CLONIDINE HCL 0.1 MG PO TABS
0.2000 mg | ORAL_TABLET | Freq: Once | ORAL | Status: AC
  Administered 2015-07-31: 0.2 mg via ORAL
  Filled 2015-07-31: qty 2

## 2015-07-31 MED ORDER — IOHEXOL 300 MG/ML  SOLN
100.0000 mL | Freq: Once | INTRAMUSCULAR | Status: AC | PRN
  Administered 2015-07-31: 100 mL via INTRAVENOUS

## 2015-07-31 MED ORDER — HYDRALAZINE HCL 25 MG PO TABS
25.0000 mg | ORAL_TABLET | Freq: Once | ORAL | Status: AC
  Administered 2015-07-31: 25 mg via ORAL
  Filled 2015-07-31: qty 1

## 2015-07-31 MED ORDER — METOPROLOL TARTRATE 50 MG PO TABS
50.0000 mg | ORAL_TABLET | Freq: Once | ORAL | Status: AC
  Administered 2015-07-31: 50 mg via ORAL
  Filled 2015-07-31: qty 1

## 2015-07-31 MED ORDER — SODIUM CHLORIDE 0.9 % IV BOLUS (SEPSIS)
1000.0000 mL | Freq: Once | INTRAVENOUS | Status: AC
  Administered 2015-07-31: 1000 mL via INTRAVENOUS

## 2015-07-31 MED ORDER — IOHEXOL 240 MG/ML SOLN
25.0000 mL | Freq: Once | INTRAMUSCULAR | Status: AC | PRN
  Administered 2015-07-31: 25 mL via ORAL

## 2015-07-31 NOTE — ED Notes (Addendum)
30 minutes after PO challenge of ginger ale pt denies nausea. MD notified.

## 2015-07-31 NOTE — ED Notes (Signed)
Pt able to tolerate graham crackers.

## 2015-07-31 NOTE — ED Notes (Signed)
Pt to CT

## 2015-07-31 NOTE — ED Notes (Signed)
C/o n,v daily x 2 weeks, states he is not eating or drinking well due to vomiting, denies any abd. pain

## 2015-07-31 NOTE — Discharge Instructions (Signed)
All doctor Chrisman's office on Monday to discuss he would like to increase your blood pressure medications. For the weekend you may increase her metoprolol to 50 mg twice a day. Also make an appointment to be seen for further evaluation of your symptoms. Return to the ER if you are unable to keep fluids down and you continue to vomit in spite of the medications or if you develop any pain, fever or for any other concerns.   Nausea and Vomiting Nausea is a sick feeling that often comes before throwing up (vomiting). Vomiting is a reflex where stomach contents come out of your mouth. Vomiting can cause severe loss of body fluids (dehydration). Children and elderly adults can become dehydrated quickly, especially if they also have diarrhea. Nausea and vomiting are symptoms of a condition or disease. It is important to find the cause of your symptoms. CAUSES   Direct irritation of the stomach lining. This irritation can result from increased acid production (gastroesophageal reflux disease), infection, food poisoning, taking certain medicines (such as nonsteroidal anti-inflammatory drugs), alcohol use, or tobacco use.  Signals from the brain.These signals could be caused by a headache, heat exposure, an inner ear disturbance, increased pressure in the brain from injury, infection, a tumor, or a concussion, pain, emotional stimulus, or metabolic problems.  An obstruction in the gastrointestinal tract (bowel obstruction).  Illnesses such as diabetes, hepatitis, gallbladder problems, appendicitis, kidney problems, cancer, sepsis, atypical symptoms of a heart attack, or eating disorders.  Medical treatments such as chemotherapy and radiation.  Receiving medicine that makes you sleep (general anesthetic) during surgery. DIAGNOSIS Your caregiver may ask for tests to be done if the problems do not improve after a few days. Tests may also be done if symptoms are severe or if the reason for the nausea and  vomiting is not clear. Tests may include:  Urine tests.  Blood tests.  Stool tests.  Cultures (to look for evidence of infection).  X-rays or other imaging studies. Test results can help your caregiver make decisions about treatment or the need for additional tests. TREATMENT You need to stay well hydrated. Drink frequently but in small amounts.You may wish to drink water, sports drinks, clear broth, or eat frozen ice pops or gelatin dessert to help stay hydrated.When you eat, eating slowly may help prevent nausea.There are also some antinausea medicines that may help prevent nausea. HOME CARE INSTRUCTIONS   Take all medicine as directed by your caregiver.  If you do not have an appetite, do not force yourself to eat. However, you must continue to drink fluids.  If you have an appetite, eat a normal diet unless your caregiver tells you differently.  Eat a variety of complex carbohydrates (rice, wheat, potatoes, bread), lean meats, yogurt, fruits, and vegetables.  Avoid high-fat foods because they are more difficult to digest.  Drink enough water and fluids to keep your urine clear or pale yellow.  If you are dehydrated, ask your caregiver for specific rehydration instructions. Signs of dehydration may include:  Severe thirst.  Dry lips and mouth.  Dizziness.  Dark urine.  Decreasing urine frequency and amount.  Confusion.  Rapid breathing or pulse. SEEK IMMEDIATE MEDICAL CARE IF:   You have blood or brown flecks (like coffee grounds) in your vomit.  You have black or bloody stools.  You have a severe headache or stiff neck.  You are confused.  You have severe abdominal pain.  You have chest pain or trouble breathing.  You do  not urinate at least once every 8 hours.  You develop cold or clammy skin.  You continue to vomit for longer than 24 to 48 hours.  You have a fever. MAKE SURE YOU:   Understand these instructions.  Will watch your  condition.  Will get help right away if you are not doing well or get worse. Document Released: 12/05/2005 Document Revised: 02/27/2012 Document Reviewed: 05/04/2011 [facility] Patient Information 2015 [place], [place]. This information is not intended to replace advice given to you by your health care provider. Make sure you discuss any questions you have with your health care provider.  Hypertension Hypertension, commonly called high blood pressure, is when the force of blood pumping through your arteries is too strong. Your arteries are the blood vessels that carry blood from your heart throughout your body. A blood pressure reading consists of a higher number over a lower number, such as 110/72. The higher number (systolic) is the pressure inside your arteries when your heart pumps. The lower number (diastolic) is the pressure inside your arteries when your heart relaxes. Ideally you want your blood pressure below 120/80. Hypertension forces your heart to work harder to pump blood. Your arteries may become narrow or stiff. Having hypertension puts you at risk for heart disease, stroke, and other problems.  RISK FACTORS Some risk factors for high blood pressure are controllable. Others are not.  Risk factors you cannot control include:   Race. You may be at higher risk if you are African American.  Age. Risk increases with age.  Gender. Men are at higher risk than women before age 81 years. After age 82, women are at higher risk than men. Risk factors you can control include:  Not getting enough exercise or physical activity.  Being overweight.  Getting too much fat, sugar, calories, or salt in your diet.  Drinking too much alcohol. SIGNS AND SYMPTOMS Hypertension does not usually cause signs or symptoms. Extremely high blood pressure (hypertensive crisis) may cause headache, anxiety, shortness of breath, and nosebleed. DIAGNOSIS  To check if you have hypertension, your health care  provider will measure your blood pressure while you are seated, with your arm held at the level of your heart. It should be measured at least twice using the same arm. Certain conditions can cause a difference in blood pressure between your right and left arms. A blood pressure reading that is higher than normal on one occasion does not mean that you need treatment. If one blood pressure reading is high, ask your health care provider about having it checked again. TREATMENT  Treating high blood pressure includes making lifestyle changes and possibly taking medicine. Living a healthy lifestyle can help lower high blood pressure. You may need to change some of your habits. Lifestyle changes may include:  Following the DASH diet. This diet is high in fruits, vegetables, and whole grains. It is low in salt, red meat, and added sugars.  Getting at least 2 hours of brisk physical activity every week.  Losing weight if necessary.  Not smoking.  Limiting alcoholic beverages.  Learning ways to reduce stress. If lifestyle changes are not enough to get your blood pressure under control, your health care provider may prescribe medicine. You may need to take more than one. Work closely with your health care provider to understand the risks and benefits. HOME CARE INSTRUCTIONS  Have your blood pressure rechecked as directed by your health care provider.   Take medicines only as directed by your  health care provider. Follow the directions carefully. Blood pressure medicines must be taken as prescribed. The medicine does not work as well when you skip doses. Skipping doses also puts you at risk for problems.   Do not smoke.   Monitor your blood pressure at home as directed by your health care provider. SEEK MEDICAL CARE IF:   You think you are having a reaction to medicines taken.  You have recurrent headaches or feel dizzy.  You have swelling in your ankles.  You have trouble with your  vision. SEEK IMMEDIATE MEDICAL CARE IF:  You develop a severe headache or confusion.  You have unusual weakness, numbness, or feel faint.  You have severe chest or abdominal pain.  You vomit repeatedly.  You have trouble breathing. MAKE SURE YOU:   Understand these instructions.  Will watch your condition.  Will get help right away if you are not doing well or get worse. Document Released: 12/05/2005 Document Revised: 04/21/2014 Document Reviewed: 09/27/2013 [facility] Patient Information 2015 [place], [place]. This information is not intended to replace advice given to you by your health care provider. Make sure you discuss any questions you have with your health care provider.
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
## 2015-08-07 DIAGNOSIS — N183 Chronic kidney disease, stage 3 (moderate): Secondary | ICD-10-CM | POA: Diagnosis not present

## 2015-08-07 DIAGNOSIS — E871 Hypo-osmolality and hyponatremia: Secondary | ICD-10-CM | POA: Diagnosis not present

## 2015-08-07 DIAGNOSIS — I10 Essential (primary) hypertension: Secondary | ICD-10-CM | POA: Diagnosis not present

## 2015-08-07 DIAGNOSIS — N179 Acute kidney failure, unspecified: Secondary | ICD-10-CM | POA: Diagnosis not present

## 2015-09-10 ENCOUNTER — Inpatient Hospital Stay: Payer: Commercial Managed Care - HMO

## 2015-09-10 ENCOUNTER — Inpatient Hospital Stay: Payer: Commercial Managed Care - HMO | Admitting: Oncology

## 2015-09-16 ENCOUNTER — Inpatient Hospital Stay (HOSPITAL_BASED_OUTPATIENT_CLINIC_OR_DEPARTMENT_OTHER): Payer: Commercial Managed Care - HMO | Admitting: Oncology

## 2015-09-16 ENCOUNTER — Inpatient Hospital Stay: Payer: Commercial Managed Care - HMO | Attending: Oncology

## 2015-09-16 VITALS — BP 130/66 | HR 60 | Temp 97.0°F | Resp 16 | Wt 146.4 lb

## 2015-09-16 DIAGNOSIS — I129 Hypertensive chronic kidney disease with stage 1 through stage 4 chronic kidney disease, or unspecified chronic kidney disease: Secondary | ICD-10-CM

## 2015-09-16 DIAGNOSIS — F4310 Post-traumatic stress disorder, unspecified: Secondary | ICD-10-CM | POA: Insufficient documentation

## 2015-09-16 DIAGNOSIS — Z79899 Other long term (current) drug therapy: Secondary | ICD-10-CM | POA: Diagnosis not present

## 2015-09-16 DIAGNOSIS — F17200 Nicotine dependence, unspecified, uncomplicated: Secondary | ICD-10-CM | POA: Diagnosis not present

## 2015-09-16 DIAGNOSIS — D696 Thrombocytopenia, unspecified: Secondary | ICD-10-CM

## 2015-09-16 DIAGNOSIS — D61818 Other pancytopenia: Secondary | ICD-10-CM | POA: Insufficient documentation

## 2015-09-16 DIAGNOSIS — D649 Anemia, unspecified: Secondary | ICD-10-CM | POA: Insufficient documentation

## 2015-09-16 DIAGNOSIS — N182 Chronic kidney disease, stage 2 (mild): Secondary | ICD-10-CM | POA: Diagnosis not present

## 2015-09-16 LAB — CBC WITH DIFFERENTIAL/PLATELET
BASOS PCT: 1 %
Basophils Absolute: 0 10*3/uL (ref 0–0.1)
Eosinophils Absolute: 0 10*3/uL (ref 0–0.7)
Eosinophils Relative: 0 %
HEMATOCRIT: 31.1 % — AB (ref 40.0–52.0)
Hemoglobin: 10.2 g/dL — ABNORMAL LOW (ref 13.0–18.0)
Lymphocytes Relative: 24 %
Lymphs Abs: 1 10*3/uL (ref 1.0–3.6)
MCH: 27 pg (ref 26.0–34.0)
MCHC: 32.9 g/dL (ref 32.0–36.0)
MCV: 82.1 fL (ref 80.0–100.0)
MONOS PCT: 11 %
Monocytes Absolute: 0.4 10*3/uL (ref 0.2–1.0)
NEUTROS ABS: 2.7 10*3/uL (ref 1.4–6.5)
Neutrophils Relative %: 64 %
Platelets: 156 10*3/uL (ref 150–440)
RBC: 3.79 MIL/uL — ABNORMAL LOW (ref 4.40–5.90)
RDW: 18.5 % — AB (ref 11.5–14.5)
WBC: 4.2 10*3/uL (ref 3.8–10.6)

## 2015-09-27 NOTE — Progress Notes (Signed)
[facility]  Telephone:([phone]) [phone] Fax:(336) [phone]  ID: Gregory Hurley OB: 1946-04-09  MR#: [id number]  TLX#:[id number]  Patient Care Team: Guadalupe Maple, MD as PCP - General (Family Medicine) Guadalupe Maple, MD (Family Medicine)  CHIEF COMPLAINT:  Chief Complaint  Patient presents with  . thrombocytopenia    INTERVAL HISTORY: Patient returns to clinic today for repeat laboratory work and further evaluation. He continues to feel well and is asymptomatic. He denies any recent fevers. He denies any weakness or fatigue. He has no easy bleeding or bruising. He is a good appetite and denies weight loss. He denies any chest pain or shortness of breath. Patient feels at his baseline and offers no specific complaints today.  REVIEW OF SYSTEMS:   Review of Systems  Constitutional: Negative for fever and malaise/fatigue.  Respiratory: Negative.   Gastrointestinal: Negative.   Neurological: Negative for weakness.  Endo/Heme/Allergies: Does not bruise/bleed easily.    As per HPI. Otherwise, a complete review of systems is negatve.  PAST MEDICAL HISTORY: Past Medical History  Diagnosis Date  . Anemia   . Pancytopenia   . Thrombocytopenia   . Elevated alkaline phosphatase level   . History of fracture of rib   . History of fracture of clavicle   . PTSD (post-traumatic stress disorder)     FOLLOWED AT [facility]  . Hypertension     PAST SURGICAL HISTORY: No past surgical history on file.  FAMILY HISTORY Family History  Problem Relation Age of Onset  . Breast cancer Mother   . Colon cancer Father   . Prostate cancer Father        ADVANCED DIRECTIVES:    HEALTH MAINTENANCE: Social History  Substance Use Topics  . Smoking status: Current Every Day Smoker -- 1.00 packs/day for 50 years    Types: Cigars  . Smokeless tobacco: Not on file     Comment: 82 PACK YR HX SMOKING SO HIGH RISK LUNG CANCER  . Alcohol Use: 0.0 oz/week    0 Standard drinks or  equivalent per week     Comment: "reports no alcohol use in 5-7 days"     Colonoscopy:  PAP:  Bone density:  Lipid panel:  Allergies  Allergen Reactions  . Ace Inhibitors     Other reaction(s): Angioedema    Current Outpatient Prescriptions  Medication Sig Dispense Refill  . aspirin 81 MG tablet Take 81 mg by mouth daily.    . chlorthalidone (HYGROTON) 25 MG tablet Take 25 mg by mouth daily.    . cloNIDine (CATAPRES) 0.2 MG tablet Take 0.2 mg by mouth 2 (two) times daily.    . hydrALAZINE (APRESOLINE) 25 MG tablet Take 25 mg by mouth 2 (two) times daily.    . metoprolol tartrate (LOPRESSOR) 25 MG tablet     . ondansetron (ZOFRAN) 4 MG tablet Take 1 tablet (4 mg total) by mouth daily as needed for nausea or vomiting. 30 tablet 1  . PARoxetine (PAXIL) 20 MG tablet Take 20 mg by mouth daily. 1/2 tab QHS    . prazosin (MINIPRESS) 2 MG capsule Take 2 mg by mouth at bedtime.     No current facility-administered medications for this visit.    OBJECTIVE: Filed Vitals:   09/16/15 1505  BP: 130/66  Pulse: 60  Temp: 97 F (36.1 C)  Resp: 16     Body mass index is 20.43 kg/(m^2).    ECOG FS:0 - Asymptomatic  General: Well-developed, well-nourished, no acute distress.  Eyes: anicteric sclera. Lungs: Clear to auscultation bilaterally. Heart: Regular rate and rhythm. No rubs, murmurs, or gallops. Abdomen: Soft, nontender, nondistended. No organomegaly noted, normoactive bowel sounds. Musculoskeletal: No edema, cyanosis, or clubbing. Neuro: Alert, answering all questions appropriately. Cranial nerves grossly intact. Skin: No rashes or petechiae noted. Psych: Normal affect.   LAB RESULTS:  Lab Results  Component Value Date   NA 131* 07/31/2015   K 4.2 07/31/2015   CL 99* 07/31/2015   CO2 24 07/31/2015   GLUCOSE 110* 07/31/2015   BUN 17 07/31/2015   CREATININE 1.46* 07/31/2015   CALCIUM 8.4* 07/31/2015   PROT 6.3* 07/31/2015   ALBUMIN 3.2* 07/31/2015   AST 84* 07/31/2015     ALT 87* 07/31/2015   ALKPHOS 124 07/31/2015   BILITOT 0.9 07/31/2015   GFRNONAA 48* 07/31/2015   GFRAA 55* 07/31/2015    Lab Results  Component Value Date   WBC 4.2 09/16/2015   NEUTROABS 2.7 09/16/2015   HGB 10.2* 09/16/2015   HCT 31.1* 09/16/2015   MCV 82.1 09/16/2015   PLT 156 09/16/2015     STUDIES: No results found.  ASSESSMENT: Pancytopenia.  PLAN:    1. Pancytopenia: Previously, her entire workup was unrevealing. Etiology possibly secondary to MDS which would require bone marrow biopsy to diagnosis. Given that the patient is asymptomatic, this is not necessary at this point. His white blood cell and platelet count are now within normal limits.  Return to clinic in 6 months with repeat laboratory work and further evaluation. If everything remains stable at that point, he likely can be discharged from clinic. 2. Hypertension: Patient's blood pressure is improved.  Continue current medications, treatment per PCP. 3. Anemia: Patient's hemoglobin has trended down. Will assess iron stores at next clinic visit. This also may be related to his mild chronic renal insufficiency. 4. Chronic renal insufficiency: Patient's creatinine is approximately his baseline. Monitor.  Patient expressed understanding and was in agreement with this plan. He also understands that He can call clinic at any time with any questions, concerns, or complaints.    Lloyd Huger, MD   09/27/2015 12:01 PM

## 2016-03-15 ENCOUNTER — Ambulatory Visit: Payer: Commercial Managed Care - HMO | Admitting: Oncology

## 2016-03-15 ENCOUNTER — Other Ambulatory Visit: Payer: Commercial Managed Care - HMO

## 2016-03-17 ENCOUNTER — Inpatient Hospital Stay: Payer: PPO

## 2016-03-17 ENCOUNTER — Inpatient Hospital Stay: Payer: PPO | Admitting: Oncology

## 2016-04-11 ENCOUNTER — Inpatient Hospital Stay: Payer: PPO | Attending: Oncology

## 2016-04-11 ENCOUNTER — Inpatient Hospital Stay: Payer: PPO | Admitting: Oncology

## 2016-11-30 ENCOUNTER — Telehealth: Payer: Self-pay | Admitting: Family Medicine

## 2016-11-30 NOTE — Telephone Encounter (Signed)
Called pt to schedule an appt. VM isn't set up on [phone], LM on VM for Ms [name] on [phone] and spoke with mom Clara Zavier Birckhead [phone]  who didn't have a good number but took a message to give him when she saw him.

## 2016-11-30 NOTE — Telephone Encounter (Signed)
Gregory Hurley, the daughter called back to let you know that the patient is in the [facility] hospital under inpatient therapy program until 12/26/2016   Thanks Santiago Glad

## 2017-05-09 ENCOUNTER — Ambulatory Visit: Payer: Self-pay | Admitting: Family Medicine

## 2017-07-07 ENCOUNTER — Observation Stay
Admission: EM | Admit: 2017-07-07 | Discharge: 2017-07-08 | Disposition: A | Discharge status: Home / Self Care | Payer: PPO | Attending: Internal Medicine | Admitting: Internal Medicine

## 2017-07-07 ENCOUNTER — Emergency Department: Payer: PPO

## 2017-07-07 ENCOUNTER — Encounter: Payer: Self-pay | Admitting: *Deleted

## 2017-07-07 DIAGNOSIS — F17210 Nicotine dependence, cigarettes, uncomplicated: Secondary | ICD-10-CM | POA: Diagnosis not present

## 2017-07-07 DIAGNOSIS — Z8042 Family history of malignant neoplasm of prostate: Secondary | ICD-10-CM | POA: Insufficient documentation

## 2017-07-07 DIAGNOSIS — E860 Dehydration: Secondary | ICD-10-CM | POA: Insufficient documentation

## 2017-07-07 DIAGNOSIS — I129 Hypertensive chronic kidney disease with stage 1 through stage 4 chronic kidney disease, or unspecified chronic kidney disease: Secondary | ICD-10-CM | POA: Insufficient documentation

## 2017-07-07 DIAGNOSIS — F329 Major depressive disorder, single episode, unspecified: Secondary | ICD-10-CM | POA: Diagnosis present

## 2017-07-07 DIAGNOSIS — Z7982 Long term (current) use of aspirin: Secondary | ICD-10-CM | POA: Insufficient documentation

## 2017-07-07 DIAGNOSIS — R748 Abnormal levels of other serum enzymes: Secondary | ICD-10-CM | POA: Insufficient documentation

## 2017-07-07 DIAGNOSIS — Z888 Allergy status to other drugs, medicaments and biological substances status: Secondary | ICD-10-CM | POA: Insufficient documentation

## 2017-07-07 DIAGNOSIS — E871 Hypo-osmolality and hyponatremia: Principal | ICD-10-CM | POA: Diagnosis present

## 2017-07-07 DIAGNOSIS — F4310 Post-traumatic stress disorder, unspecified: Secondary | ICD-10-CM | POA: Insufficient documentation

## 2017-07-07 DIAGNOSIS — Z803 Family history of malignant neoplasm of breast: Secondary | ICD-10-CM | POA: Insufficient documentation

## 2017-07-07 DIAGNOSIS — Z800 Family history of malignant neoplasm of digestive organs: Secondary | ICD-10-CM | POA: Diagnosis not present

## 2017-07-07 DIAGNOSIS — I10 Essential (primary) hypertension: Secondary | ICD-10-CM | POA: Diagnosis present

## 2017-07-07 DIAGNOSIS — R42 Dizziness and giddiness: Secondary | ICD-10-CM | POA: Diagnosis not present

## 2017-07-07 DIAGNOSIS — F32A Depression, unspecified: Secondary | ICD-10-CM | POA: Diagnosis present

## 2017-07-07 DIAGNOSIS — I951 Orthostatic hypotension: Secondary | ICD-10-CM | POA: Diagnosis not present

## 2017-07-07 DIAGNOSIS — N183 Chronic kidney disease, stage 3 unspecified: Secondary | ICD-10-CM | POA: Diagnosis present

## 2017-07-07 DIAGNOSIS — R55 Syncope and collapse: Secondary | ICD-10-CM | POA: Diagnosis not present

## 2017-07-07 DIAGNOSIS — E876 Hypokalemia: Secondary | ICD-10-CM | POA: Diagnosis present

## 2017-07-07 DIAGNOSIS — Z79899 Other long term (current) drug therapy: Secondary | ICD-10-CM | POA: Diagnosis not present

## 2017-07-07 DIAGNOSIS — D696 Thrombocytopenia, unspecified: Secondary | ICD-10-CM | POA: Insufficient documentation

## 2017-07-07 HISTORY — DX: Chronic kidney disease, stage 3 (moderate): N18.3

## 2017-07-07 HISTORY — DX: Chronic kidney disease, stage 3 unspecified: N18.30

## 2017-07-07 LAB — URINALYSIS, COMPLETE (UACMP) WITH MICROSCOPIC
BILIRUBIN URINE: NEGATIVE
Bacteria, UA: NONE SEEN
Glucose, UA: NEGATIVE mg/dL
Hgb urine dipstick: NEGATIVE
Ketones, ur: NEGATIVE mg/dL
LEUKOCYTES UA: NEGATIVE
Nitrite: NEGATIVE
PH: 6 (ref 5.0–8.0)
Protein, ur: NEGATIVE mg/dL
RBC / HPF: NONE SEEN RBC/hpf (ref 0–5)
SPECIFIC GRAVITY, URINE: 1.01 (ref 1.005–1.030)
WBC, UA: NONE SEEN WBC/hpf (ref 0–5)

## 2017-07-07 LAB — COMPREHENSIVE METABOLIC PANEL
ALBUMIN: 3.6 g/dL (ref 3.5–5.0)
ALK PHOS: 101 U/L (ref 38–126)
ALT: 10 U/L — AB (ref 17–63)
ANION GAP: 8 (ref 5–15)
AST: 17 U/L (ref 15–41)
BILIRUBIN TOTAL: 0.6 mg/dL (ref 0.3–1.2)
BUN: 12 mg/dL (ref 6–20)
CALCIUM: 8.6 mg/dL — AB (ref 8.9–10.3)
CO2: 27 mmol/L (ref 22–32)
CREATININE: 1.44 mg/dL — AB (ref 0.61–1.24)
Chloride: 90 mmol/L — ABNORMAL LOW (ref 101–111)
GFR calc Af Amer: 55 mL/min — ABNORMAL LOW (ref >60)
GFR calc non Af Amer: 48 mL/min — ABNORMAL LOW (ref >60)
GLUCOSE: 81 mg/dL (ref 65–99)
Potassium: 2.8 mmol/L — ABNORMAL LOW (ref 3.5–5.1)
Sodium: 125 mmol/L — ABNORMAL LOW (ref 135–145)
TOTAL PROTEIN: 6.8 g/dL (ref 6.5–8.1)

## 2017-07-07 LAB — CBC
HEMATOCRIT: 34.1 % — AB (ref 40.0–52.0)
Hemoglobin: 11.8 g/dL — ABNORMAL LOW (ref 13.0–18.0)
MCH: 26.5 pg (ref 26.0–34.0)
MCHC: 34.7 g/dL (ref 32.0–36.0)
MCV: 76.5 fL — AB (ref 80.0–100.0)
Platelets: 164 10*3/uL (ref 150–440)
RBC: 4.45 MIL/uL (ref 4.40–5.90)
RDW: 16.3 % — AB (ref 11.5–14.5)
WBC: 2.8 10*3/uL — AB (ref 3.8–10.6)

## 2017-07-07 LAB — TROPONIN I: Troponin I: <0.03 ng/mL (ref <0.03)

## 2017-07-07 LAB — DIFFERENTIAL
BASOS ABS: 0 10*3/uL (ref 0–0.1)
Basophils Relative: 0 %
EOS ABS: 0 10*3/uL (ref 0–0.7)
EOS PCT: 1 %
Lymphocytes Relative: 53 %
Lymphs Abs: 1.5 10*3/uL (ref 1.0–3.6)
Monocytes Absolute: 0.3 10*3/uL (ref 0.2–1.0)
Monocytes Relative: 11 %
NEUTROS PCT: 35 %
Neutro Abs: 1 10*3/uL — ABNORMAL LOW (ref 1.4–6.5)

## 2017-07-07 MED ORDER — ENOXAPARIN SODIUM 40 MG/0.4ML ~~LOC~~ SOLN
40.0000 mg | SUBCUTANEOUS | Status: DC
  Administered 2017-07-07: 40 mg via SUBCUTANEOUS
  Filled 2017-07-07: qty 0.4

## 2017-07-07 MED ORDER — HYDRALAZINE HCL 20 MG/ML IJ SOLN: 10.0000 mg | INTRAMUSCULAR | Status: DC | PRN

## 2017-07-07 MED ORDER — ASPIRIN EC 81 MG PO TBEC
81.0000 mg | DELAYED_RELEASE_TABLET | Freq: Every day | ORAL | Status: DC
  Administered 2017-07-08: 81 mg via ORAL
  Filled 2017-07-07: qty 1

## 2017-07-07 MED ORDER — POTASSIUM CHLORIDE CRYS ER 20 MEQ PO TBCR
40.0000 meq | EXTENDED_RELEASE_TABLET | Freq: Once | ORAL | Status: AC
  Administered 2017-07-07: 40 meq via ORAL
  Filled 2017-07-07: qty 2

## 2017-07-07 MED ORDER — PAROXETINE HCL 20 MG PO TABS
40.0000 mg | ORAL_TABLET | Freq: Every day | ORAL | Status: DC
  Administered 2017-07-08: 40 mg via ORAL
  Filled 2017-07-07: qty 2

## 2017-07-07 MED ORDER — SODIUM CHLORIDE 0.9 % IV SOLN
INTRAVENOUS | Status: AC
  Administered 2017-07-07: 19:00:00 via INTRAVENOUS

## 2017-07-07 MED ORDER — AMLODIPINE BESYLATE 10 MG PO TABS
10.0000 mg | ORAL_TABLET | Freq: Every day | ORAL | Status: DC
  Administered 2017-07-08: 10 mg via ORAL
  Filled 2017-07-07: qty 1

## 2017-07-07 MED ORDER — ONDANSETRON HCL 4 MG/2ML IJ SOLN: 4.0000 mg | Freq: Four times a day (QID) | INTRAMUSCULAR | Status: DC | PRN

## 2017-07-07 MED ORDER — CLONIDINE HCL 0.1 MG PO TABS
0.2000 mg | ORAL_TABLET | Freq: Two times a day (BID) | ORAL | Status: DC
  Administered 2017-07-07 – 2017-07-08 (×2): 0.2 mg via ORAL
  Filled 2017-07-07 (×2): qty 2

## 2017-07-07 MED ORDER — MELATONIN 5 MG PO TABS
5.0000 mg | ORAL_TABLET | Freq: Every day | ORAL | Status: DC
  Filled 2017-07-07 (×2): qty 1

## 2017-07-07 MED ORDER — SODIUM CHLORIDE 0.9 % IV SOLN
Freq: Once | INTRAVENOUS | Status: AC
  Administered 2017-07-07: 16:00:00 via INTRAVENOUS

## 2017-07-07 MED ORDER — ONDANSETRON HCL 4 MG PO TABS: 4.0000 mg | ORAL_TABLET | Freq: Four times a day (QID) | ORAL | Status: DC | PRN

## 2017-07-07 MED ORDER — ACETAMINOPHEN 650 MG RE SUPP: 650.0000 mg | Freq: Four times a day (QID) | RECTAL | Status: DC | PRN

## 2017-07-07 MED ORDER — LATANOPROST 0.005 % OP SOLN
1.0000 [drp] | Freq: Every day | OPHTHALMIC | Status: DC
  Filled 2017-07-07: qty 2.5

## 2017-07-07 MED ORDER — ACETAMINOPHEN 325 MG PO TABS: 650.0000 mg | ORAL_TABLET | Freq: Four times a day (QID) | ORAL | Status: DC | PRN

## 2017-07-07 NOTE — H&P (Signed)
[place] [facility] [place] NAME: Gregory Hurley    MR#:  [id number]  DATE OF BIRTH:  [birth year]  DATE OF ADMISSION:  07/07/2017  PRIMARY CARE PHYSICIAN: Guadalupe Maple, MD   REQUESTING/REFERRING PHYSICIAN: Jimmye Norman, MD  CHIEF COMPLAINT:   Chief Complaint  Patient presents with  . Loss of Consciousness    HISTORY OF PRESENT ILLNESS:  Gregory Hurley  is a 71 y.o. male who presents with Recurrent syncope. Patient states that for the last month he's been having syncopal or near syncopal episodes. He states that oftentimes on these days he will check his blood pressure at some point in the morning and it will be low, around 90s/40s per his report. He describes episodes as acute onset of "dizziness", at which point he can often make his way quickly to a seat and could sometimes avoid full loss of consciousness. He denies any concurrent chest pain, shortness of breath, cough, abdominal pain, nausea or vomiting, palpitations, or diaphoresis. Initial workup in the ED is largely within normal limits except for some hypokalemia and hyponatremia. Hospitals were called for admission and further evaluation  PAST MEDICAL HISTORY:   Past Medical History:  Diagnosis Date  . Anemia   . CKD (chronic kidney disease), stage III   . Elevated alkaline phosphatase level   . History of fracture of clavicle   . History of fracture of rib   . Hypertension   . Pancytopenia ([place])   . PTSD (post-traumatic stress disorder)    FOLLOWED AT [facility]  . Thrombocytopenia ([place])     PAST SURGICAL HISTORY:   Past Surgical History:  Procedure Laterality Date  . NO PAST SURGERIES      SOCIAL HISTORY:   Social History  Substance Use Topics  . Smoking status: Current Every Day Smoker    Packs/day: 1.00    Years: 50.00    Types: Cigars  . Smokeless tobacco: Not on file     Comment: 11 PACK YR HX SMOKING SO HIGH RISK LUNG CANCER  . Alcohol use 0.0 oz/week      Comment: "reports no alcohol use in 5-7 days"    FAMILY HISTORY:   Family History  Problem Relation Age of Onset  . Breast cancer Mother   . Colon cancer Father   . Prostate cancer Father     DRUG ALLERGIES:   Allergies  Allergen Reactions  . Ace Inhibitors     Other reaction(s): Angioedema    MEDICATIONS AT HOME:   Prior to Admission medications   Medication Sig Start Date End Date Taking? Authorizing Provider  acetaminophen (TYLENOL) 500 MG tablet Take 500 mg by mouth every 6 (six) hours as needed.   Yes [provider]  albuterol (PROVENTIL HFA;VENTOLIN HFA) 108 (90 Base) MCG/ACT inhaler Inhale into the lungs every 6 (six) hours as needed for wheezing or shortness of breath.   Yes [provider]  amLODipine (NORVASC) 10 MG tablet Take 10 mg by mouth daily.   Yes [provider]  aspirin 81 MG tablet Take 81 mg by mouth daily.   Yes [provider]  carvedilol (COREG) 12.5 MG tablet Take 12.5 mg by mouth 2 (two) times daily with a meal.   Yes [provider]  cloNIDine (CATAPRES) 0.2 MG tablet Take 0.2 mg by mouth 2 (two) times daily.   Yes [provider]  latanoprost (XALATAN) 0.005 % ophthalmic solution Place 1 drop into both eyes at  bedtime.   Yes [provider]  Melatonin 3 MG TABS Take 6 mg by mouth at bedtime.   Yes [provider]  PARoxetine (PAXIL) 40 MG tablet Take 40 mg by mouth daily.    Yes [provider]  sennosides-docusate sodium (SENOKOT-S) 8.6-50 MG tablet Take 1 tablet by mouth 2 (two) times daily.   Yes [provider]  traZODone (DESYREL) 100 MG tablet Take 100 mg by mouth at bedtime.   Yes [provider]  vitamin B-12 (CYANOCOBALAMIN) 1000 MCG tablet Take 2,000 mcg by mouth daily.   Yes [provider]  chlorthalidone (HYGROTON) 25 MG tablet Take 25 mg by mouth daily.    [provider]    REVIEW OF SYSTEMS:  Review of Systems   Constitutional: Negative for chills, fever, malaise/fatigue and weight loss.  HENT: Negative for ear pain, hearing loss and tinnitus.   Eyes: Negative for blurred vision, double vision, pain and redness.  Respiratory: Negative for cough, hemoptysis and shortness of breath.   Cardiovascular: Negative for chest pain, palpitations, orthopnea and leg swelling.  Gastrointestinal: Negative for abdominal pain, constipation, diarrhea, nausea and vomiting.  Genitourinary: Negative for dysuria, frequency and hematuria.  Musculoskeletal: Negative for back pain, joint pain and neck pain.  Skin:       No acne, rash, or lesions  Neurological: Positive for loss of consciousness. Negative for dizziness, tremors, focal weakness and weakness.  Endo/Heme/Allergies: Negative for polydipsia. Does not bruise/bleed easily.  Psychiatric/Behavioral: Negative for depression. The patient is not nervous/anxious and does not have insomnia.      VITAL SIGNS:   Vitals:   07/07/17 1430 07/07/17 1530 07/07/17 1600 07/07/17 1630  BP: 111/62 (!) 114/59 (!) 127/51 (!) 149/68  Pulse: (!) 57 (!) 55 (!) 58 (!) 52  Resp: 18 17 17 12   Temp:      TempSrc:      SpO2: 99% 100% 100% 100%  Weight:      Height:       Wt Readings from Last 3 Encounters:  07/07/17 77.1 kg (170 lb)  09/16/15 66.4 kg (146 lb 6.2 oz)  07/31/15 65.8 kg (145 lb)    PHYSICAL EXAMINATION:  Physical Exam  Vitals reviewed. Constitutional: He is oriented to person, place, and time. He appears well-developed and well-nourished. No distress.  HENT:  Head: Normocephalic and atraumatic.  Mouth/Throat: Oropharynx is clear and moist.  Eyes: Pupils are equal, round, and reactive to light. Conjunctivae and EOM are normal. No scleral icterus.  Neck: Normal range of motion. Neck supple. No JVD present. No thyromegaly present.  Cardiovascular: Normal rate, regular rhythm and intact distal pulses.  Exam reveals no gallop and no friction rub.   No murmur  heard. Respiratory: Effort normal and breath sounds normal. No respiratory distress. He has no wheezes. He has no rales.  GI: Soft. Bowel sounds are normal. He exhibits no distension. There is no tenderness.  Musculoskeletal: Normal range of motion. He exhibits no edema.  No arthritis, no gout  Lymphadenopathy:    He has no cervical adenopathy.  Neurological: He is alert and oriented to person, place, and time. No cranial nerve deficit.  No dysarthria, no aphasia  Skin: Skin is warm and dry. No rash noted. No erythema.  Psychiatric: He has a normal mood and affect. His behavior is normal. Judgment and thought content normal.    LABORATORY PANEL:   CBC  Recent Labs Lab 07/07/17 1404  WBC 2.8*  HGB 11.8*  HCT 34.1*  PLT 164   ------------------------------------------------------------------------------------------------------------------  Chemistries   Recent Labs Lab 07/07/17 1404  NA 125*  K 2.8*  CL 90*  CO2 27  GLUCOSE 81  BUN 12  CREATININE 1.44*  CALCIUM 8.6*  AST 17  ALT 10*  ALKPHOS 101  BILITOT 0.6   ------------------------------------------------------------------------------------------------------------------  Cardiac Enzymes  Recent Labs Lab 07/07/17 1404  TROPONINI <0.03   ------------------------------------------------------------------------------------------------------------------  RADIOLOGY:  Dg Chest Port 1 View  Result Date: 07/07/2017 CLINICAL DATA:  Six syncopal episode since to an.  Dizziness. EXAM: PORTABLE CHEST 1 VIEW COMPARISON:  02/23/2015 FINDINGS: 1416 hours. Lungs are hyperexpanded. The lungs are clear without focal pneumonia, edema, pneumothorax or pleural effusion. The cardiopericardial silhouette is within normal limits for size. Old left-sided rib fractures noted. Telemetry leads overlie the chest. IMPRESSION: Stable.  Hyperexpansion without acute cardiopulmonary findings. Electronically Signed   By: Misty Stanley M.D.    On: 07/07/2017 14:33    EKG:   Orders placed or performed during the hospital encounter of 07/07/17  . ED EKG  . ED EKG  . EKG 12-Lead  . EKG 12-Lead    IMPRESSION AND PLAN:  Principal Problem:   Syncope - we will trend his cardiac enzymes tonight, get an echocardiogram and a cardiology consult for the morning. Active Problems:   Hyponatremia - unclear etiology, though poor by mouth intake seems to be the likely cause, though patient is on chlorthalidone which may have contributed as well. We will hydrate him with normal saline upfront and monitor his sodium for improvement.   Hypokalemia - replace and monitor   HTN (hypertension) - continue home meds except for chlorthalidone   Depression - home dose antidepressants   CKD (chronic kidney disease), stage III - at baseline, monitor and avoid nephrotoxins  All the records are reviewed and case discussed with ED provider. Management plans discussed with the patient and/or family.  DVT PROPHYLAXIS: SubQ lovenox  GI PROPHYLAXIS: None  ADMISSION STATUS: Observation  CODE STATUS: Full Code Status History    This patient does not have a recorded code status. Please follow your organizational policy for patients in this situation.      TOTAL TIME TAKING CARE OF THIS PATIENT: 40 minutes.   Yaniyah Koors [place]ley 07/07/2017, 4:47 PM  [facility]  Office  [phone]  CC: Primary care physician; Guadalupe Maple, MD  Note:  This document was prepared using Dragon voice recognition software and may include unintentional dictation errors.

## 2017-07-07 NOTE — ED Notes (Signed)
Attempting to call report at this time. 

## 2017-07-07 NOTE — ED Provider Notes (Signed)
[facility] Emergency Department Provider Note       Time seen: ----------------------------------------- 1:59 PM on 07/07/2017 -----------------------------------------     I have reviewed the triage vital signs and the nursing notes.   HISTORY   Chief Complaint No chief complaint on file.    HPI Gregory Hurley is a 71 y.o. male who presents to the ED for syncope. Patient reports since June he said 6 episodes of syncope. Patient states she is up walking around with these events occur. He has not had any specific pain or trauma from passing out. Patient does not know of this happening, reports blood pressure was somewhat low yesterday. He has run out of 2 of his blood pressure medications including chlorthalidone and amlodipine. He denies fevers, chills, chest pain, shortness of breath, vomiting or diarrhea.   Past Medical History:  Diagnosis Date  . Anemia   . Elevated alkaline phosphatase level   . History of fracture of clavicle   . History of fracture of rib   . Hypertension   . Pancytopenia   . PTSD (post-traumatic stress disorder)    FOLLOWED AT [facility]  . Thrombocytopenia     There are no active problems to display for this patient.   No past surgical history on file.  Allergies Ace inhibitors  Social History Social History  Substance Use Topics  . Smoking status: Current Every Day Smoker    Packs/day: 1.00    Years: 50.00    Types: Cigars  . Smokeless tobacco: Not on file     Comment: 54 PACK YR HX SMOKING SO HIGH RISK LUNG CANCER  . Alcohol use 0.0 oz/week     Comment: "reports no alcohol use in 5-7 days"    Review of Systems Constitutional: Negative for fever. Eyes: Negative for vision changes ENT:  Negative for congestion, sore throat Cardiovascular: Negative for chest pain. Respiratory: Negative for shortness of breath. Gastrointestinal: Negative for abdominal pain, vomiting and diarrhea. Genitourinary: Negative for  dysuria. Musculoskeletal: Negative for back pain. Skin: Negative for rash. Neurological: Negative for headaches, focal weakness or numbness.  All systems negative/normal/unremarkable except as stated in the HPI  ____________________________________________   PHYSICAL EXAM:  VITAL SIGNS: ED Triage Vitals  Enc Vitals Group     BP      Pulse      Resp      Temp      Temp src      SpO2      Weight      Height      Head Circumference      Peak Flow      Pain Score      Pain Loc      Pain Edu?      Excl. in [place]?     Constitutional: Alert and oriented. Well appearing and in no distress. Eyes: Conjunctivae are normal. Normal extraocular movements. ENT   Head: There is a benign-appearing growths on the right parietal scalp   Nose: No congestion/rhinnorhea.   Mouth/Throat: Mucous membranes are moist.   Neck: No stridor. Mobile anterior left-sided neck mass Cardiovascular: Normal rate, regular rhythm. No murmurs, rubs, or gallops. Respiratory: Normal respiratory effort without tachypnea nor retractions. Breath sounds are clear and equal bilaterally. No wheezes/rales/rhonchi. Gastrointestinal: Soft and nontender. Normal bowel sounds Musculoskeletal: Nontender with normal range of motion in extremities. No lower extremity tenderness nor edema. Neurologic:  Normal speech and language. No gross focal neurologic deficits are appreciated.  Skin:  Skin is warm, dry and intact.  Psychiatric: Mood and affect are normal. Speech and behavior are normal.  ____________________________________________  EKG: Interpreted by me.Sinus rhythm with prolonged PR interval, normal QRS size, nonspecific T wave changes, normal QT  ____________________________________________  ED COURSE:  Pertinent labs & imaging results that were available during my care of the patient were reviewed by me and considered in my medical decision making (see chart for details). Patient presents for syncope,  we will assess with labs and imaging as indicated. Clinical Course as of Jul 08 1607  Fri Jul 07, 2017  1539 Patient was orthostatic on examination with lying blood pressure of 116 and a standing blood pressure of 91  [JW]  1547 Patient is declining admission to the hospital. I've advised we will recheck his chemistries for improvement after a liter saline.  [JW]    Clinical Course User Index [JW] Earleen Newport, MD   Procedures ____________________________________________   LABS (pertinent positives/negatives)  Labs Reviewed  CBC - Abnormal; Notable for the following:       Result Value   WBC 2.8 (*)    Hemoglobin 11.8 (*)    HCT 34.1 (*)    MCV 76.5 (*)    RDW 16.3 (*)    All other components within normal limits  COMPREHENSIVE METABOLIC PANEL - Abnormal; Notable for the following:    Sodium 125 (*)    Potassium 2.8 (*)    Chloride 90 (*)    Creatinine, Ser 1.44 (*)    Calcium 8.6 (*)    ALT 10 (*)    GFR calc non Af Amer 48 (*)    GFR calc Af Amer 55 (*)    All other components within normal limits  URINALYSIS, COMPLETE (UACMP) WITH MICROSCOPIC - Abnormal; Notable for the following:    Color, Urine YELLOW (*)    APPearance CLEAR (*)    Squamous Epithelial / LPF 0-5 (*)    All other components within normal limits  TROPONIN I    RADIOLOGY Images were viewed by me  Chest x-ray IMPRESSION: Stable. Hyperexpansion without acute cardiopulmonary findings. ____________________________________________  FINAL ASSESSMENT AND PLAN  Syncope, Hyponatremia  Plan: Patient's labs and imaging were dictated above. Patient had presented for multiple episodes of syncope over the past several weeks. This is likely a combination of medication and dehydration related. We have started him on saline infusion. He has agreed to be observed in the hospital.   Earleen Newport, MD   Note: This note was generated in part or whole with voice recognition software. Voice  recognition is usually quite accurate but there are transcription errors that can and very often do occur. I apologize for any typographical errors that were not detected and corrected.     Earleen Newport, MD 07/07/17 [phone]

## 2017-07-07 NOTE — ED Notes (Signed)
Warm blanket and call bell given, awake and alert in no acute distress

## 2017-07-07 NOTE — ED Triage Notes (Signed)
Pt states 6 syncopal episodes since June, states dizziness before epsisodes , denies any pain at present, awake and alert in no acute distress

## 2017-07-08 ENCOUNTER — Observation Stay (HOSPITAL_BASED_OUTPATIENT_CLINIC_OR_DEPARTMENT_OTHER)
Admit: 2017-07-08 | Discharge: 2017-07-08 | Disposition: A | Discharge status: Home / Self Care | Payer: PPO | Attending: Internal Medicine | Admitting: Internal Medicine

## 2017-07-08 DIAGNOSIS — E871 Hypo-osmolality and hyponatremia: Secondary | ICD-10-CM | POA: Diagnosis not present

## 2017-07-08 DIAGNOSIS — E876 Hypokalemia: Secondary | ICD-10-CM | POA: Diagnosis not present

## 2017-07-08 DIAGNOSIS — I10 Essential (primary) hypertension: Secondary | ICD-10-CM

## 2017-07-08 DIAGNOSIS — R55 Syncope and collapse: Secondary | ICD-10-CM | POA: Diagnosis not present

## 2017-07-08 DIAGNOSIS — I341 Nonrheumatic mitral (valve) prolapse: Secondary | ICD-10-CM | POA: Diagnosis not present

## 2017-07-08 DIAGNOSIS — N179 Acute kidney failure, unspecified: Secondary | ICD-10-CM

## 2017-07-08 LAB — TROPONIN I: Troponin I: <0.03 ng/mL (ref <0.03)

## 2017-07-08 LAB — BASIC METABOLIC PANEL
ANION GAP: 5 (ref 5–15)
BUN: 12 mg/dL (ref 6–20)
CHLORIDE: 99 mmol/L — AB (ref 101–111)
CO2: 27 mmol/L (ref 22–32)
Calcium: 8.6 mg/dL — ABNORMAL LOW (ref 8.9–10.3)
Creatinine, Ser: 1.15 mg/dL (ref 0.61–1.24)
GFR calc Af Amer: >60 mL/min (ref >60)
GLUCOSE: 102 mg/dL — AB (ref 65–99)
POTASSIUM: 3.3 mmol/L — AB (ref 3.5–5.1)
Sodium: 131 mmol/L — ABNORMAL LOW (ref 135–145)

## 2017-07-08 LAB — CBC
HEMATOCRIT: 34.6 % — AB (ref 40.0–52.0)
Hemoglobin: 11.8 g/dL — ABNORMAL LOW (ref 13.0–18.0)
MCH: 26.3 pg (ref 26.0–34.0)
MCHC: 34.2 g/dL (ref 32.0–36.0)
MCV: 76.8 fL — AB (ref 80.0–100.0)
Platelets: 150 10*3/uL (ref 150–440)
RBC: 4.5 MIL/uL (ref 4.40–5.90)
RDW: 16.7 % — ABNORMAL HIGH (ref 11.5–14.5)
WBC: 2.3 10*3/uL — ABNORMAL LOW (ref 3.8–10.6)

## 2017-07-08 LAB — ECHOCARDIOGRAM COMPLETE
HEIGHTINCHES: 71 in
Weight: 2720 oz

## 2017-07-08 MED ORDER — CARVEDILOL 3.125 MG PO TABS: 3.1250 mg | ORAL_TABLET | Freq: Two times a day (BID) | ORAL | 0 refills | Status: DC

## 2017-07-08 MED ORDER — POTASSIUM CHLORIDE CRYS ER 20 MEQ PO TBCR
20.0000 meq | EXTENDED_RELEASE_TABLET | Freq: Two times a day (BID) | ORAL | Status: DC
  Administered 2017-07-08: 20 meq via ORAL
  Filled 2017-07-08: qty 1

## 2017-07-08 NOTE — Discharge Summary (Signed)
[facility] NAME: Gregory Hurley    MR#:  [id number]  DATE OF BIRTH:  1946-02-09  DATE OF ADMISSION:  07/07/2017 ADMITTING PHYSICIAN: Lance Coon, MD  DATE OF DISCHARGE: 07/08/2017  PRIMARY CARE PHYSICIAN: Guadalupe Maple, MD    ADMISSION DIAGNOSIS:  Syncope and collapse [R55] Hyponatremia [E87.1]  DISCHARGE DIAGNOSIS:  Principal Problem:   Syncope Active Problems:   HTN (hypertension)   Depression   CKD (chronic kidney disease), stage III   Hyponatremia   Hypokalemia   SECONDARY DIAGNOSIS:   Past Medical History:  Diagnosis Date  . Anemia   . CKD (chronic kidney disease), stage III   . Elevated alkaline phosphatase level   . History of fracture of clavicle   . History of fracture of rib   . Hypertension   . Pancytopenia ([place])   . PTSD (post-traumatic stress disorder)    FOLLOWED AT [facility]  . Thrombocytopenia ([place])     HOSPITAL COURSE:   * Syncope -    Orthostatic hypotension, due to dehydration.   Ac renal insufficiency    Secondary to chlorthalidone use.   Stop that.   BP stable now, renal func improved, feels better.  *  Hyponatremia -    chlorthalidone which may have contributed    hydrate him with normal saline , Noted improvement.  *  Hypokalemia - replace and monitor *  HTN (hypertension) - continue home meds except for chlorthalidone *  Depression - home dose antidepressants   CKD (chronic kidney disease), stage III - at baseline, monitor and avoid nephrotoxins   creatinin baseline 1.08 in 2016- improved to 1.15 after IV fluids.   DISCHARGE CONDITIONS:   Stable.  CONSULTS OBTAINED:  Treatment Team:  Minna Merritts, MD  DRUG ALLERGIES:   Allergies  Allergen Reactions  . Ace Inhibitors     Other reaction(s): Angioedema    DISCHARGE MEDICATIONS:   Current Discharge Medication List    CONTINUE these medications which have CHANGED   Details  carvedilol (COREG)  3.125 MG tablet Take 1 tablet (3.125 mg total) by mouth 2 (two) times daily with a meal. Qty: 60 tablet, Refills: 0      CONTINUE these medications which have NOT CHANGED   Details  acetaminophen (TYLENOL) 500 MG tablet Take 500 mg by mouth every 6 (six) hours as needed.    albuterol (PROVENTIL HFA;VENTOLIN HFA) 108 (90 Base) MCG/ACT inhaler Inhale into the lungs every 6 (six) hours as needed for wheezing or shortness of breath.    amLODipine (NORVASC) 10 MG tablet Take 10 mg by mouth daily.    aspirin 81 MG tablet Take 81 mg by mouth daily.    cloNIDine (CATAPRES) 0.2 MG tablet Take 0.2 mg by mouth 2 (two) times daily.    latanoprost (XALATAN) 0.005 % ophthalmic solution Place 1 drop into both eyes at bedtime.    Melatonin 3 MG TABS Take 6 mg by mouth at bedtime.    PARoxetine (PAXIL) 40 MG tablet Take 40 mg by mouth daily.     sennosides-docusate sodium (SENOKOT-S) 8.6-50 MG tablet Take 1 tablet by mouth 2 (two) times daily.    traZODone (DESYREL) 100 MG tablet Take 100 mg by mouth at bedtime.    vitamin B-12 (CYANOCOBALAMIN) 1000 MCG tablet Take 2,000 mcg by mouth daily.      STOP taking these medications     chlorthalidone (HYGROTON) 25 MG tablet  DISCHARGE INSTRUCTIONS:    Follow with PMD in 1-2 weeks.  If you experience worsening of your admission symptoms, develop shortness of breath, life threatening emergency, suicidal or homicidal thoughts you must seek medical attention immediately by calling 911 or calling your MD immediately  if symptoms less severe.  You Must read complete instructions/literature along with all the possible adverse reactions/side effects for all the Medicines you take and that have been prescribed to you. Take any new Medicines after you have completely understood and accept all the possible adverse reactions/side effects.   Please note  You were cared for by a hospitalist during your hospital stay. If you have any questions about  your discharge medications or the care you received while you were in the hospital after you are discharged, you can call the unit and asked to speak with the hospitalist on call if the hospitalist that took care of you is not available. Once you are discharged, your primary care physician will handle any further medical issues. Please note that NO REFILLS for any discharge medications will be authorized once you are discharged, as it is imperative that you return to your primary care physician (or establish a relationship with a primary care physician if you do not have one) for your aftercare needs so that they can reassess your need for medications and monitor your lab values.    Today   CHIEF COMPLAINT:   Chief Complaint  Patient presents with  . Loss of Consciousness    HISTORY OF PRESENT ILLNESS:  Gregory Hurley  is a 71 y.o. male presents with Recurrent syncope. Patient states that for the last month he's been having syncopal or near syncopal episodes. He states that oftentimes on these days he will check his blood pressure at some point in the morning and it will be low, around 90s/40s per his report. He describes episodes as acute onset of "dizziness", at which point he can often make his way quickly to a seat and could sometimes avoid full loss of consciousness. He denies any concurrent chest pain, shortness of breath, cough, abdominal pain, nausea or vomiting, palpitations, or diaphoresis. Initial workup in the ED is largely within normal limits except for some hypokalemia and hyponatremia. Hospitals were called for admission and further evaluation  VITAL SIGNS:  Blood pressure (!) 127/52, pulse (!) 57, temperature 98.5 F (36.9 C), temperature source Oral, resp. rate 19, height 5\' 11"  (1.803 m), weight 77.1 kg (170 lb), SpO2 100 %.  I/O:   Intake/Output Summary (Last 24 hours) at 07/08/17 1451 Last data filed at 07/08/17 1244  Gross per 24 hour  Intake             2040 ml  Output              2250 ml  Net             -210 ml    PHYSICAL EXAMINATION:  GENERAL:  71 y.o.-year-old patient lying in the bed with no acute distress.  EYES: Pupils equal, round, reactive to light and accommodation. No scleral icterus. Extraocular muscles intact.  HEENT: Head atraumatic, normocephalic. Oropharynx and nasopharynx clear.  NECK:  Supple, no jugular venous distention. No thyroid enlargement, no tenderness.  LUNGS: Normal breath sounds bilaterally, no wheezing, rales,rhonchi or crepitation. No use of accessory muscles of respiration.  CARDIOVASCULAR: S1, S2 normal. No murmurs, rubs, or gallops.  ABDOMEN: Soft, non-tender, non-distended. Bowel sounds present. No organomegaly or mass.  EXTREMITIES: No pedal edema,  cyanosis, or clubbing.  NEUROLOGIC: Cranial nerves II through XII are intact. Muscle strength 5/5 in all extremities. Sensation intact. Gait not checked.  PSYCHIATRIC: The patient is alert and oriented x 3.  SKIN: No obvious rash, lesion, or ulcer.   DATA REVIEW:   CBC  Recent Labs Lab 07/08/17 0703  WBC 2.3*  HGB 11.8*  HCT 34.6*  PLT 150    Chemistries   Recent Labs Lab 07/07/17 1404 07/08/17 0703  NA 125* 131*  K 2.8* 3.3*  CL 90* 99*  CO2 27 27  GLUCOSE 81 102*  BUN 12 12  CREATININE 1.44* 1.15  CALCIUM 8.6* 8.6*  AST 17  --   ALT 10*  --   ALKPHOS 101  --   BILITOT 0.6  --     Cardiac Enzymes  Recent Labs Lab 07/08/17 0703  TROPONINI <0.03    Microbiology Results  No results found for this or any previous visit.  RADIOLOGY:  Dg Chest Port 1 View  Result Date: 07/07/2017 CLINICAL DATA:  Six syncopal episode since to an.  Dizziness. EXAM: PORTABLE CHEST 1 VIEW COMPARISON:  02/23/2015 FINDINGS: 1416 hours. Lungs are hyperexpanded. The lungs are clear without focal pneumonia, edema, pneumothorax or pleural effusion. The cardiopericardial silhouette is within normal limits for size. Old left-sided rib fractures noted. Telemetry leads  overlie the chest. IMPRESSION: Stable.  Hyperexpansion without acute cardiopulmonary findings. Electronically Signed   By: Misty Stanley M.D.   On: 07/07/2017 14:33    EKG:   Orders placed or performed during the hospital encounter of 07/07/17  . ED EKG  . ED EKG  . EKG 12-Lead  . EKG 12-Lead      Management plans discussed with the patient, family and they are in agreement.  CODE STATUS:     Code Status Orders        Start     Ordered   07/07/17 1850  Full code  Continuous     07/07/17 1849    Code Status History    Date Active Date Inactive Code Status Order ID Comments User Context   This patient has a current code status but no historical code status.    Advance Directive Documentation     Most Recent Value  Type of Advance Directive  Healthcare Power of Attorney  Pre-existing out of facility DNR order (yellow form or pink MOST form)  -  "MOST" Form in Place?  -      TOTAL TIME TAKING CARE OF THIS PATIENT: 35 minutes.    Vaughan Basta M.D on 07/08/2017 at 2:51 PM  Between 7am to 6pm - Pager - [phone]  After 6pm go to [email or web address] - password [email or web address] [facility]  Office  [phone]  CC: Primary care physician; Guadalupe Maple, MD   Note: This dictation was prepared with Dragon dictation along with smaller phrase technology. Any transcriptional errors that result from this process are unintentional.

## 2017-07-08 NOTE — Consult Note (Signed)
Cardiology Consultation:   Patient ID: Gregory Hurley; [id number]; [birth year]   Admit date: 07/07/2017 Date of Consult: 07/08/2017  Primary Care Provider: Guadalupe Maple, MD Primary Cardiologist: New to [facility] placed by Dr. Lance Hurley for syncope   Patient Profile:   Gregory Hurley is a 71 y.o. male with a hx of Anemia, long smoking history 50 years, COPD, chronic kidney disease, PTSD/depression follow the [facility], hypertension, who is being seen today for the evaluation of  syncope the request of Dr. Lance Hurley  History of Present Illness:   Gregory Hurley  Reports having 6 episodes of syncope Typically standing when syncope/Near syncope and lightheadedness presents Has had low blood pressure at home  Has continued to take chlorthalidone and amlodipine  Reports having these symptoms over the past several weeks to me getting worse Recorded blood pressure at home 90s/40s  Reports having acute onset of "dizziness" when he stands up, tries to make it to a sitting position to avoid "passing out'  He denies any concurrent chest pain, shortness of breath, cough, abdominal pain, nausea or vomiting, palpitations, or diaphoresis.  Review of lab work shows hyponatremia, hypokalemia Potassium 2.8, sodium 125, creatinine 1.44  Notes indicating he was orthostatic in the emergency room systolic pressure 329 supine down to 91 systolic when standing Was given IV fluids in the emergency room  Past Medical History:  Diagnosis Date  . Anemia   . CKD (chronic kidney disease), stage III   . Elevated alkaline phosphatase level   . History of fracture of clavicle   . History of fracture of rib   . Hypertension   . Pancytopenia ([place])   . PTSD (post-traumatic stress disorder)    FOLLOWED AT [facility]  . Thrombocytopenia ([place])     Past Surgical History:  Procedure Laterality Date  . NO PAST SURGERIES       Inpatient Medications: Scheduled Meds: . amLODipine  10 mg Oral Daily    . aspirin EC  81 mg Oral Daily  . cloNIDine  0.2 mg Oral BID  . enoxaparin (LOVENOX) injection  40 mg Subcutaneous Q24H  . latanoprost  1 drop Both Eyes QHS  . Melatonin  5 mg Oral QHS  . PARoxetine  40 mg Oral Daily   Continuous Infusions:  PRN Meds: acetaminophen **OR** acetaminophen, hydrALAZINE, ondansetron **OR** ondansetron (ZOFRAN) IV  Allergies:    Allergies  Allergen Reactions  . Ace Inhibitors     Other reaction(s): Angioedema    Social History:   Social History   Social History  . Marital status: Single    Spouse name: N/A  . Number of children: N/A  . Years of education: N/A   Occupational History  . Not on file.   Social History Main Topics  . Smoking status: Current Every Day Smoker    Packs/day: 1.00    Years: 50.00    Types: Cigars  . Smokeless tobacco: Never Used     Comment: 67 PACK YR HX SMOKING SO HIGH RISK LUNG CANCER  . Alcohol use 0.0 oz/week     Comment: "reports no alcohol use in 5-7 days"  . Drug use: Yes     Comment: H/X OF CANNABIS ABUSE  . Sexual activity: Not on file   Other Topics Concern  . Not on file   Social History Narrative  . No narrative on file    Family History:   The patient's family history includes Breast cancer in his mother; Colon cancer  in his father; Prostate cancer in his father.  ROS:  Please see the history of present illness.  Review of Systems  Constitution: Negative for diaphoresis, fever, weakness, malaise/fatigue and night sweats.  HENT: Negative.   Eyes: Negative.   Cardiovascular: Positive for near-syncope and syncope. Negative for chest pain, claudication, cyanosis, dyspnea on exertion, irregular heartbeat, leg swelling, orthopnea, palpitations and paroxysmal nocturnal dyspnea.  Respiratory: Negative for cough, shortness of breath, sleep disturbances due to breathing and wheezing.   Endocrine: Negative.   Hematologic/Lymphatic: Negative.   Skin: Negative.   Musculoskeletal: Negative for falls,  joint pain, joint swelling and myalgias.  Gastrointestinal: Negative.   Neurological: Positive for dizziness. Negative for difficulty with concentration, excessive daytime sleepiness, focal weakness, light-headedness and numbness.  Psychiatric/Behavioral: Negative.     All other ROS reviewed and negative.     Physical Exam/Data:   Vitals:   07/07/17 1847 07/07/17 2022 07/08/17 0432 07/08/17 0900  BP: (!) 198/83 (!) 158/64 (!) 148/54 (!) 171/65  Pulse: 60 66 (!) 54 (!) 57  Resp: 18 18 16    Temp: 98.3 F (36.8 C) 98 F (36.7 C) 98.4 F (36.9 C)   TempSrc:  Oral Oral   SpO2:  99% 100%   Weight:      Height:        Intake/Output Summary (Last 24 hours) at 07/08/17 0930 Last data filed at 07/08/17 0858  Gross per 24 hour  Intake             1800 ml  Output             1950 ml  Net             -150 ml   Filed Weights   07/07/17 1404  Weight: 170 lb (77.1 kg)   Body mass index is 23.71 kg/m.  General:  Well nourished, well developed, in no acute distress HEENT: normal Lymph: no adenopathy Neck: no JVD Endocrine:  No thryomegaly Vascular: No carotid bruits; FA pulses 2+ bilaterally without bruits  Cardiac:  normal S1, S2; RRR; no murmur  Lungs:  clear to auscultation bilaterally, no wheezing, rhonchi or rales  Abd: soft, nontender, no hepatomegaly  Ext: no edema Musculoskeletal:  No deformities, BUE and BLE strength normal and equal Skin: warm and dry  Neuro:  CNs 2-12 intact, no focal abnormalities noted Psych:  Normal affect   EKG:  The EKG was personally reviewed and demonstrates:  Normal sinus rhythm with no significant ST or T-wave changes. Rate 59 bpm Telemetry:  Telemetry was personally reviewed and demonstrates:  Normal sinus rhythm  Relevant CV Studies: Echocardiogram personally reviewed by myself showing normal LV function, no significant valve disease, essentially normal study  Laboratory Data:  Chemistry Recent Labs Lab 07/07/17 1404 07/08/17 0703    NA 125* 131*  K 2.8* 3.3*  CL 90* 99*  CO2 27 27  GLUCOSE 81 102*  BUN 12 12  CREATININE 1.44* 1.15  CALCIUM 8.6* 8.6*  GFRNONAA 48* >60  GFRAA 55* >60  ANIONGAP 8 5     Recent Labs Lab 07/07/17 1404  PROT 6.8  ALBUMIN 3.6  AST 17  ALT 10*  ALKPHOS 101  BILITOT 0.6   Hematology Recent Labs Lab 07/07/17 1404 07/08/17 0703  WBC 2.8* 2.3*  RBC 4.45 4.50  HGB 11.8* 11.8*  HCT 34.1* 34.6*  MCV 76.5* 76.8*  MCH 26.5 26.3  MCHC 34.7 34.2  RDW 16.3* 16.7*  PLT 164 150   Cardiac  Enzymes Recent Labs Lab 07/07/17 1404 07/07/17 1933 07/08/17 0030 07/08/17 0703  TROPONINI <0.03 <0.03 <0.03 <0.03   No results for input(s): TROPIPOC in the last 168 hours.  BNPNo results for input(s): BNP, PROBNP in the last 168 hours.  DDimer No results for input(s): DDIMER in the last 168 hours.  Radiology/Studies:  Dg Chest Port 1 View  Result Date: 07/07/2017 CLINICAL DATA:  Six syncopal episode since to an.  Dizziness. EXAM: PORTABLE CHEST 1 VIEW COMPARISON:  02/23/2015 FINDINGS: 1416 hours. Lungs are hyperexpanded. The lungs are clear without focal pneumonia, edema, pneumothorax or pleural effusion. The cardiopericardial silhouette is within normal limits for size. Old left-sided rib fractures noted. Telemetry leads overlie the chest. IMPRESSION: Stable.  Hyperexpansion without acute cardiopulmonary findings. Electronically Signed   By: Misty Stanley M.D.   On: 07/07/2017 14:33    Assessment and Plan:   1)Syncope/near-syncope Secondary to orthostasis as confirmed by drop in blood pressure in the emergency room, lab work consistent with prerenal state   normal ejection fraction greater than 55% No significant arrhythmia on telemetry  initial creatinine 1.44   would hold chlorthalidone Continued mild orthostasis today with 27 point drop in pressure systolic with standing -If he continues to have orthostasis, could decrease clonidine down to 0.1 mg twice a day  2)  Hyponatremia Likely Secondary to chlorthalidone    would hold chlorthalidone  3) hypo-kalemia Likely secondary to chlorthalidone Would hold  4) smoking/COPD We have encouraged him to continue to work on weaning his cigarettes and smoking cessation. He will continue to work on this and does not want any assistance with chantix.   5) hypertension As above could continue amlodipine 10 mg daily, clonidine 0.2 twice a day For continued orthostasis, may need to decrease clonidine Clonidine also likely contributing to borderline low heart rate, periodically in the 50s   6) Acute renal failure   resolved with IV fluids, secondary to dehydration/prerenal state.   Total encounter time more than 110 minutes  Greater than 50% was spent in counseling and coordination of care with the patient    Signed, Ida Rogue, MD  07/08/2017 9:30 AM

## 2017-07-08 NOTE — Progress Notes (Signed)
Patient alert and oriented, vss, no complaints of pain.  No significant events during shift.  SB with 1degree HB on telemtry.

## 2018-05-31 ENCOUNTER — Encounter: Payer: Self-pay | Admitting: Emergency Medicine

## 2018-05-31 ENCOUNTER — Emergency Department: Payer: Medicare HMO

## 2018-05-31 ENCOUNTER — Emergency Department
Admission: EM | Admit: 2018-05-31 | Discharge: 2018-05-31 | Disposition: A | Discharge status: Other Acute Inpatient Hospital | Payer: Medicare HMO | Attending: Emergency Medicine | Admitting: Emergency Medicine

## 2018-05-31 ENCOUNTER — Other Ambulatory Visit: Payer: Self-pay

## 2018-05-31 DIAGNOSIS — J9602 Acute respiratory failure with hypercapnia: Secondary | ICD-10-CM | POA: Diagnosis not present

## 2018-05-31 DIAGNOSIS — W1839XA Other fall on same level, initial encounter: Secondary | ICD-10-CM | POA: Diagnosis not present

## 2018-05-31 DIAGNOSIS — R489 Unspecified symbolic dysfunctions: Secondary | ICD-10-CM | POA: Diagnosis not present

## 2018-05-31 DIAGNOSIS — N183 Chronic kidney disease, stage 3 (moderate): Secondary | ICD-10-CM | POA: Insufficient documentation

## 2018-05-31 DIAGNOSIS — S12500A Unspecified displaced fracture of sixth cervical vertebra, initial encounter for closed fracture: Secondary | ICD-10-CM | POA: Diagnosis not present

## 2018-05-31 DIAGNOSIS — S129XXA Fracture of neck, unspecified, initial encounter: Secondary | ICD-10-CM | POA: Insufficient documentation

## 2018-05-31 DIAGNOSIS — D62 Acute posthemorrhagic anemia: Secondary | ICD-10-CM | POA: Diagnosis not present

## 2018-05-31 DIAGNOSIS — I169 Hypertensive crisis, unspecified: Secondary | ICD-10-CM | POA: Diagnosis not present

## 2018-05-31 DIAGNOSIS — Z7982 Long term (current) use of aspirin: Secondary | ICD-10-CM | POA: Diagnosis not present

## 2018-05-31 DIAGNOSIS — L723 Sebaceous cyst: Secondary | ICD-10-CM | POA: Diagnosis not present

## 2018-05-31 DIAGNOSIS — F1490 Cocaine use, unspecified, uncomplicated: Secondary | ICD-10-CM | POA: Diagnosis not present

## 2018-05-31 DIAGNOSIS — M50322 Other cervical disc degeneration at C5-C6 level: Secondary | ICD-10-CM | POA: Diagnosis not present

## 2018-05-31 DIAGNOSIS — M546 Pain in thoracic spine: Secondary | ICD-10-CM | POA: Diagnosis not present

## 2018-05-31 DIAGNOSIS — S300XXA Contusion of lower back and pelvis, initial encounter: Secondary | ICD-10-CM | POA: Diagnosis not present

## 2018-05-31 DIAGNOSIS — Y929 Unspecified place or not applicable: Secondary | ICD-10-CM | POA: Insufficient documentation

## 2018-05-31 DIAGNOSIS — Z7409 Other reduced mobility: Secondary | ICD-10-CM | POA: Diagnosis not present

## 2018-05-31 DIAGNOSIS — I10 Essential (primary) hypertension: Secondary | ICD-10-CM | POA: Diagnosis not present

## 2018-05-31 DIAGNOSIS — S12690A Other displaced fracture of seventh cervical vertebra, initial encounter for closed fracture: Secondary | ICD-10-CM | POA: Diagnosis not present

## 2018-05-31 DIAGNOSIS — R55 Syncope and collapse: Secondary | ICD-10-CM | POA: Diagnosis not present

## 2018-05-31 DIAGNOSIS — K5903 Drug induced constipation: Secondary | ICD-10-CM | POA: Diagnosis not present

## 2018-05-31 DIAGNOSIS — S12600A Unspecified displaced fracture of seventh cervical vertebra, initial encounter for closed fracture: Secondary | ICD-10-CM | POA: Diagnosis not present

## 2018-05-31 DIAGNOSIS — G8254 Quadriplegia, C5-C7 incomplete: Secondary | ICD-10-CM | POA: Diagnosis not present

## 2018-05-31 DIAGNOSIS — M542 Cervicalgia: Secondary | ICD-10-CM | POA: Diagnosis not present

## 2018-05-31 DIAGNOSIS — M549 Dorsalgia, unspecified: Secondary | ICD-10-CM | POA: Diagnosis not present

## 2018-05-31 DIAGNOSIS — S14127D Central cord syndrome at C7 level of cervical spinal cord, subsequent encounter: Secondary | ICD-10-CM | POA: Diagnosis not present

## 2018-05-31 DIAGNOSIS — R319 Hematuria, unspecified: Secondary | ICD-10-CM | POA: Diagnosis not present

## 2018-05-31 DIAGNOSIS — S14124A Central cord syndrome at C4 level of cervical spinal cord, initial encounter: Secondary | ICD-10-CM | POA: Diagnosis not present

## 2018-05-31 DIAGNOSIS — R51 Headache: Secondary | ICD-10-CM | POA: Insufficient documentation

## 2018-05-31 DIAGNOSIS — M6250 Muscle wasting and atrophy, not elsewhere classified, unspecified site: Secondary | ICD-10-CM | POA: Diagnosis not present

## 2018-05-31 DIAGNOSIS — I129 Hypertensive chronic kidney disease with stage 1 through stage 4 chronic kidney disease, or unspecified chronic kidney disease: Secondary | ICD-10-CM | POA: Insufficient documentation

## 2018-05-31 DIAGNOSIS — S14129S Central cord syndrome at unspecified level of cervical spinal cord, sequela: Secondary | ICD-10-CM | POA: Diagnosis not present

## 2018-05-31 DIAGNOSIS — M79609 Pain in unspecified limb: Secondary | ICD-10-CM | POA: Diagnosis not present

## 2018-05-31 DIAGNOSIS — M545 Low back pain: Secondary | ICD-10-CM | POA: Diagnosis not present

## 2018-05-31 DIAGNOSIS — Y939 Activity, unspecified: Secondary | ICD-10-CM | POA: Insufficient documentation

## 2018-05-31 DIAGNOSIS — Z4789 Encounter for other orthopedic aftercare: Secondary | ICD-10-CM | POA: Diagnosis not present

## 2018-05-31 DIAGNOSIS — R200 Anesthesia of skin: Secondary | ICD-10-CM | POA: Diagnosis not present

## 2018-05-31 DIAGNOSIS — W19XXXA Unspecified fall, initial encounter: Secondary | ICD-10-CM | POA: Insufficient documentation

## 2018-05-31 DIAGNOSIS — G8250 Quadriplegia, unspecified: Secondary | ICD-10-CM | POA: Diagnosis not present

## 2018-05-31 DIAGNOSIS — Y33XXXA Other specified events, undetermined intent, initial encounter: Secondary | ICD-10-CM | POA: Diagnosis not present

## 2018-05-31 DIAGNOSIS — M6281 Muscle weakness (generalized): Secondary | ICD-10-CM | POA: Diagnosis not present

## 2018-05-31 DIAGNOSIS — G8252 Quadriplegia, C1-C4 incomplete: Secondary | ICD-10-CM | POA: Diagnosis not present

## 2018-05-31 DIAGNOSIS — F17290 Nicotine dependence, other tobacco product, uncomplicated: Secondary | ICD-10-CM | POA: Insufficient documentation

## 2018-05-31 DIAGNOSIS — S14129A Central cord syndrome at unspecified level of cervical spinal cord, initial encounter: Secondary | ICD-10-CM

## 2018-05-31 DIAGNOSIS — E872 Acidosis: Secondary | ICD-10-CM | POA: Diagnosis not present

## 2018-05-31 DIAGNOSIS — F4310 Post-traumatic stress disorder, unspecified: Secondary | ICD-10-CM | POA: Diagnosis not present

## 2018-05-31 DIAGNOSIS — S199XXA Unspecified injury of neck, initial encounter: Secondary | ICD-10-CM | POA: Diagnosis present

## 2018-05-31 DIAGNOSIS — Z736 Limitation of activities due to disability: Secondary | ICD-10-CM | POA: Diagnosis not present

## 2018-05-31 DIAGNOSIS — S12600D Unspecified displaced fracture of seventh cervical vertebra, subsequent encounter for fracture with routine healing: Secondary | ICD-10-CM | POA: Diagnosis not present

## 2018-05-31 DIAGNOSIS — Z781 Physical restraint status: Secondary | ICD-10-CM | POA: Diagnosis not present

## 2018-05-31 DIAGNOSIS — E871 Hypo-osmolality and hyponatremia: Secondary | ICD-10-CM | POA: Diagnosis not present

## 2018-05-31 DIAGNOSIS — M4812 Ankylosing hyperostosis [Forestier], cervical region: Secondary | ICD-10-CM | POA: Diagnosis not present

## 2018-05-31 DIAGNOSIS — Z79899 Other long term (current) drug therapy: Secondary | ICD-10-CM | POA: Insufficient documentation

## 2018-05-31 DIAGNOSIS — R531 Weakness: Secondary | ICD-10-CM | POA: Diagnosis not present

## 2018-05-31 DIAGNOSIS — S14126A Central cord syndrome at C6 level of cervical spinal cord, initial encounter: Secondary | ICD-10-CM | POA: Diagnosis not present

## 2018-05-31 DIAGNOSIS — S14104A Unspecified injury at C4 level of cervical spinal cord, initial encounter: Secondary | ICD-10-CM | POA: Diagnosis not present

## 2018-05-31 DIAGNOSIS — M40202 Unspecified kyphosis, cervical region: Secondary | ICD-10-CM | POA: Diagnosis not present

## 2018-05-31 DIAGNOSIS — J9601 Acute respiratory failure with hypoxia: Secondary | ICD-10-CM | POA: Diagnosis not present

## 2018-05-31 DIAGNOSIS — Y999 Unspecified external cause status: Secondary | ICD-10-CM | POA: Diagnosis not present

## 2018-05-31 DIAGNOSIS — Z7401 Bed confinement status: Secondary | ICD-10-CM | POA: Diagnosis not present

## 2018-05-31 DIAGNOSIS — S12500D Unspecified displaced fracture of sixth cervical vertebra, subsequent encounter for fracture with routine healing: Secondary | ICD-10-CM | POA: Diagnosis not present

## 2018-05-31 DIAGNOSIS — S12590A Other displaced fracture of sixth cervical vertebra, initial encounter for closed fracture: Secondary | ICD-10-CM | POA: Diagnosis not present

## 2018-05-31 DIAGNOSIS — S299XXA Unspecified injury of thorax, initial encounter: Secondary | ICD-10-CM | POA: Diagnosis not present

## 2018-05-31 DIAGNOSIS — F1010 Alcohol abuse, uncomplicated: Secondary | ICD-10-CM | POA: Diagnosis not present

## 2018-05-31 DIAGNOSIS — J9692 Respiratory failure, unspecified with hypercapnia: Secondary | ICD-10-CM | POA: Diagnosis not present

## 2018-05-31 DIAGNOSIS — S14127A Central cord syndrome at C7 level of cervical spinal cord, initial encounter: Secondary | ICD-10-CM | POA: Diagnosis not present

## 2018-05-31 DIAGNOSIS — S0990XA Unspecified injury of head, initial encounter: Secondary | ICD-10-CM | POA: Diagnosis not present

## 2018-05-31 DIAGNOSIS — Z981 Arthrodesis status: Secondary | ICD-10-CM | POA: Diagnosis not present

## 2018-05-31 DIAGNOSIS — S14129D Central cord syndrome at unspecified level of cervical spinal cord, subsequent encounter: Secondary | ICD-10-CM | POA: Diagnosis not present

## 2018-05-31 DIAGNOSIS — R609 Edema, unspecified: Secondary | ICD-10-CM | POA: Diagnosis not present

## 2018-05-31 DIAGNOSIS — R0989 Other specified symptoms and signs involving the circulatory and respiratory systems: Secondary | ICD-10-CM | POA: Diagnosis not present

## 2018-05-31 LAB — BASIC METABOLIC PANEL
Anion gap: 10 (ref 5–15)
BUN: 14 mg/dL (ref 6–20)
CHLORIDE: 95 mmol/L — AB (ref 101–111)
CO2: 20 mmol/L — AB (ref 22–32)
CREATININE: 1.33 mg/dL — AB (ref 0.61–1.24)
Calcium: 9 mg/dL (ref 8.9–10.3)
GFR calc Af Amer: >60 mL/min (ref >60)
GFR calc non Af Amer: 52 mL/min — ABNORMAL LOW (ref >60)
Glucose, Bld: 106 mg/dL — ABNORMAL HIGH (ref 65–99)
Potassium: 3.6 mmol/L (ref 3.5–5.1)
SODIUM: 125 mmol/L — AB (ref 135–145)

## 2018-05-31 LAB — CBC WITH DIFFERENTIAL/PLATELET
Basophils Absolute: 0 10*3/uL (ref 0–0.1)
Basophils Relative: 0 %
EOS ABS: 0 10*3/uL (ref 0–0.7)
Eosinophils Relative: 0 %
HEMATOCRIT: 35.5 % — AB (ref 40.0–52.0)
HEMOGLOBIN: 11.8 g/dL — AB (ref 13.0–18.0)
LYMPHS ABS: 0.6 10*3/uL — AB (ref 1.0–3.6)
Lymphocytes Relative: 10 %
MCH: 27 pg (ref 26.0–34.0)
MCHC: 33.3 g/dL (ref 32.0–36.0)
MCV: 80.8 fL (ref 80.0–100.0)
MONO ABS: 0.2 10*3/uL (ref 0.2–1.0)
MONOS PCT: 3 %
NEUTROS PCT: 87 %
Neutro Abs: 5.1 10*3/uL (ref 1.4–6.5)
Platelets: 121 10*3/uL — ABNORMAL LOW (ref 150–440)
RBC: 4.39 MIL/uL — ABNORMAL LOW (ref 4.40–5.90)
RDW: 15.2 % — ABNORMAL HIGH (ref 11.5–14.5)
WBC: 5.9 10*3/uL (ref 3.8–10.6)

## 2018-05-31 LAB — TROPONIN I: Troponin I: <0.03 ng/mL (ref <0.03)

## 2018-05-31 LAB — POCT PREGNANCY, URINE: Preg Test, Ur: NEGATIVE

## 2018-05-31 MED ORDER — MORPHINE SULFATE (PF) 4 MG/ML IV SOLN
4.0000 mg | Freq: Once | INTRAVENOUS | Status: AC
Start: 2018-05-31 — End: 2018-05-31
  Administered 2018-05-31: 4 mg via INTRAVENOUS

## 2018-05-31 MED ORDER — ONDANSETRON HCL 4 MG/2ML IJ SOLN
4.0000 mg | Freq: Once | INTRAMUSCULAR | Status: AC
  Administered 2018-05-31: 4 mg via INTRAVENOUS
  Filled 2018-05-31: qty 2

## 2018-05-31 MED ORDER — MORPHINE SULFATE (PF) 4 MG/ML IV SOLN
INTRAVENOUS | Status: AC
  Filled 2018-05-31: qty 1

## 2018-05-31 MED ORDER — ONDANSETRON HCL 4 MG/2ML IJ SOLN
INTRAMUSCULAR | Status: AC
  Filled 2018-05-31: qty 2

## 2018-05-31 MED ORDER — MORPHINE SULFATE (PF) 4 MG/ML IV SOLN
4.0000 mg | Freq: Once | INTRAVENOUS | Status: AC
  Administered 2018-05-31: 4 mg via INTRAVENOUS
  Filled 2018-05-31: qty 1

## 2018-05-31 MED ORDER — ONDANSETRON HCL 4 MG/2ML IJ SOLN
4.0000 mg | Freq: Once | INTRAMUSCULAR | Status: AC
  Administered 2018-05-31: 4 mg via INTRAVENOUS

## 2018-05-31 NOTE — ED Notes (Signed)
Pt calling out to nurses station again to asked to be repositioned, head of bed lifted to highest position and pt reminded that neck has to remain still in order to not further damage current injuries. Pt also requesting for pain medicine, RN Iris notified at this time

## 2018-05-31 NOTE — ED Provider Notes (Addendum)
[facility] Emergency Department Provider Note ___________________________________________   First MD Initiated Contact with Patient 05/31/18 1644     (approximate)  I have reviewed the triage vital signs and the nursing notes.  HISTORY  Chief Complaint Loss of Consciousness  HPI Gregory Hurley is a 72 y.o. male history of thrombocytopenia, CKD, multiple syncopal episodes and intermittent left-sided headaches over the past several years was presented to the emergency department today after syncopal episode.  He says that he was standing up when he had sudden onset of a left-sided headache over his left temple.  He says that it was associated with lightheadedness.  He says that he then passed out.  He says that the headache is minimal at this time but he is having left-sided neck pain.  He was placed in a cervical collar at the scene but took off the cervical collar in route.  However, he states that he is unable to grip with his bilateral hands and feels numb to his bilateral upper and lower extremities.  He says that he also feels weak in his lower extremities as well.  However, not as much of the upper extremities.  Patient does not report visual deficits.  Says that he has had a biopsy of his left temporal artery which was normal.  Past Medical History:  Diagnosis Date  . Anemia   . CKD (chronic kidney disease), stage III   . Elevated alkaline phosphatase level   . History of fracture of clavicle   . History of fracture of rib   . Hypertension   . Pancytopenia ([place])   . PTSD (post-traumatic stress disorder)    FOLLOWED AT [facility]  . Thrombocytopenia [facility])     Patient Active Problem List   Diagnosis Date Noted  . Syncope 07/07/2017  . HTN (hypertension) 07/07/2017  . Depression 07/07/2017  . CKD (chronic kidney disease), stage III ([place]) 07/07/2017  . Hyponatremia 07/07/2017  . Hypokalemia 07/07/2017    Past Surgical History:  Procedure Laterality  Date  . NO PAST SURGERIES      Prior to Admission medications   Medication Sig Start Date End Date Taking? Authorizing Provider  acetaminophen (TYLENOL) 500 MG tablet Take 500 mg by mouth every 6 (six) hours as needed.    [provider]  albuterol (PROVENTIL HFA;VENTOLIN HFA) 108 (90 Base) MCG/ACT inhaler Inhale into the lungs every 6 (six) hours as needed for wheezing or shortness of breath.    [provider]  amLODipine (NORVASC) 10 MG tablet Take 10 mg by mouth daily.    [provider]  aspirin 81 MG tablet Take 81 mg by mouth daily.    [provider]  carvedilol (COREG) 3.125 MG tablet Take 1 tablet (3.125 mg total) by mouth 2 (two) times daily with a meal. 07/08/17   Vaughan Basta, MD  cloNIDine (CATAPRES) 0.2 MG tablet Take 0.2 mg by mouth 2 (two) times daily.    [provider]  latanoprost (XALATAN) 0.005 % ophthalmic solution Place 1 drop into both eyes at bedtime.    [provider]  Melatonin 3 MG TABS Take 6 mg by mouth at bedtime.    [provider]  PARoxetine (PAXIL) 40 MG tablet Take 40 mg by mouth daily.     [provider]  sennosides-docusate sodium (SENOKOT-S) 8.6-50 MG tablet Take 1 tablet by mouth 2 (two) times daily.    [provider]  traZODone (DESYREL) 100 MG tablet Take 100  mg by mouth at bedtime.    [provider]  vitamin B-12 (CYANOCOBALAMIN) 1000 MCG tablet Take 2,000 mcg by mouth daily.    [provider]    Allergies Ace inhibitors  Family History  Problem Relation Age of Onset  . Breast cancer Mother   . Colon cancer Father   . Prostate cancer Father     Social History Social History   Tobacco Use  . Smoking status: Current Every Day Smoker    Packs/day: 1.00    Years: 50.00    Pack years: 50.00    Types: Cigars  . Smokeless tobacco: Never Used  . Tobacco comment: 56 PACK YR HX SMOKING SO HIGH RISK LUNG CANCER  Substance Use Topics   . Alcohol use: Yes    Alcohol/week: 0.0 oz    Comment: "reports no alcohol use in 5-7 days"  . Drug use: Yes    Comment: H/X OF CANNABIS ABUSE    Review of Systems  Constitutional: No fever/chills Eyes: No visual changes. ENT: No sore throat. Cardiovascular: Denies chest pain. Respiratory: Denies shortness of breath. Gastrointestinal: No abdominal pain.  No nausea, no vomiting.  No diarrhea.  No constipation. Genitourinary: Negative for dysuria. Musculoskeletal: Negative for back pain. Skin: Negative for rash. Neurological: Negative for focal weakness or numbness.   ____________________________________________   PHYSICAL EXAM:  VITAL SIGNS: ED Triage Vitals [05/31/18 1658]  Enc Vitals Group     BP (!) 155/77     Pulse Rate (!) 55     Resp 14     Temp (!) 97.5 F (36.4 C)     Temp Source Oral     SpO2 100 %     Weight      Height      Head Circumference      Peak Flow      Pain Score      Pain Loc      Pain Edu?      Excl. in [place]?     Constitutional: Alert and oriented. Well appearing and in no acute distress. Eyes: Conjunctivae are normal.  Head: Atraumatic.  Lipoma to the superior and right aspect of the temporal parietal region of the cranium.  Patient says this lipomas based on size. Nose: No congestion/rhinnorhea. Mouth/Throat: Mucous membranes are moist.  Neck: No stridor.  No midline cervical spine tenderness to palpation.  No deformity or step-off.  Left trapezius tenderness to palpation.  Also appears to have a neck lipoma.  Neck lipoma to the anterior and left lateral neck also based on size per patient. Cardiovascular: Normal rate, regular rhythm. Grossly normal heart sounds.  Good peripheral circulation with equal and bilateral radial as well as dorsalis pedis pulses. Respiratory: Normal respiratory effort.  No retractions. Lungs CTAB. Gastrointestinal: Soft and nontender. No distention. No CVA tenderness. Musculoskeletal: Weak grip strength to the  bilateral hands.  Sensation is intact to light touch the bilateral upper extremities.  3 out of 5 strength in bilateral lower extremities.  Sensation to light touch is also intact in bilateral lower extremities.  Tenderness to the low thoracic spine to the midline as well as to the midline lumbar spine without any deformity or step-off. Neurologic:  Normal speech and language.  Skin:  Skin is warm, dry and intact. No rash noted. Psychiatric: Mood and affect are normal. Speech and behavior are normal.  ____________________________________________   LABS (all labs ordered are listed, but only abnormal results are displayed)  Labs Reviewed  CBC WITH DIFFERENTIAL/PLATELET  BASIC METABOLIC PANEL  TROPONIN I   ____________________________________________  EKG  ED ECG REPORT I, Doran Stabler, the attending physician, personally viewed and interpreted this ECG.   Date: 05/31/2018  EKG Time: 1653  Rate: 55  Rhythm: normal sinus rhythm  Axis: Normal  Intervals:none  ST&T Change: No ST segment elevation or depression.  T wave inversions in aVL.  ____________________________________________  RADIOLOGY  Chest x-ray without active disease  No acute brain injury on the head CT.  Mildly displaced fracture involving the base of the C6 spinous process extending into the left lamina of C6.  Minimally displaced fracture involving the left superior C7 facet at the C6-7 facet joint extending to the base of the left C7 transverse process.  No acute finding to the CT of the thoracic and lumbar spines. ____________________________________________   PROCEDURES  Procedure(s) performed:   .Critical Care Performed by: Orbie Pyo, MD Authorized by: Orbie Pyo, MD   Critical care provider statement:    Critical care time (minutes):  35   Critical care was necessary to treat or prevent imminent or life-threatening deterioration of the following conditions:  CNS  failure or compromise   Critical care was time spent personally by me on the following activities:  Development of treatment plan with patient or surrogate, discussions with consultants, examination of patient, ordering and performing treatments and interventions, ordering and review of laboratory studies, ordering and review of radiographic studies and re-evaluation of patient's condition    Critical Care performed:   ____________________________________________   INITIAL IMPRESSION / ASSESSMENT AND PLAN / ED COURSE  Pertinent labs & imaging results that were available during my care of the patient were reviewed by me and considered in my medical decision making (see chart for details).  DDX: Central cord syndrome, syncope, cervical spine fracture, thoracic and lumbar spine fractures, cord compression, numbness, paresthesia, subarachnoid hemorrhage, skull fracture As part of my medical decision making, I reviewed the following data within the [place] Notes from prior ED visits  ----------------------------------------- 6:44 PM on 05/31/2018 -----------------------------------------  Discussed case with Dr. Cari Caraway of neurosurgery who recommends transfer to the trauma service at [facility].  Patient auto accepted by Dr. Clydell Hakim.  Concern for central cord syndrome.  Patient with unchanged neuro exam at this time.  Patient and family aware of diagnosis as well as need for transfer and willing to comply.  ____________________________________________   FINAL CLINICAL IMPRESSION(S) / ED DIAGNOSES  Cervical spine fracture.  Central cord syndrome.    NEW MEDICATIONS STARTED DURING THIS VISIT:  New Prescriptions   No medications on file     Note:  This document was prepared using Dragon voice recognition software and may include unintentional dictation errors.     Orbie Pyo, MD 05/31/18 1845  Patient was immediately placed back in a cervical  collar upon initial assessment and remains immobilized.   Orbie Pyo, MD 05/31/18 [phone]

## 2018-05-31 NOTE — ED Triage Notes (Signed)
Pt to ED via EMS from home c/o becoming dizzy and falling around 1445 today, states HA to left head prior to event, c/o neck and back pain, numbness to entire body, bilateral hip pain.  Pt presents A&Ox4, speaking in complete and coherent sentences, c-collar placed back on patient in ED.

## 2018-05-31 NOTE — ED Notes (Signed)
Pt calling out to nurses station to request to be repositioned, this tech in pt room at this time to sit head of bed up per pt request, pt reminded of neck injuries as well as reminded to continue to not move head or neck. Pt c-collar remains on

## 2018-05-31 NOTE — ED Notes (Signed)
Pt's medications and glasses in plastic bag handed to [facility] crew transporting patient.

## 2018-05-31 NOTE — ED Notes (Signed)
Patient calling out for RN. This RN to bedside. Patient asking to be repositioned. Patient repositioned, and reported increased comfort. Primary RN notified.

## 2018-05-31 NOTE — ED Notes (Signed)
Paramedic student in pt room to obtain VS, VSS at this time

## 2018-05-31 NOTE — ED Notes (Signed)
Pt returned from CT at this time.  

## 2018-05-31 NOTE — ED Notes (Signed)
Pt calling out to nurses station and stating that he is wanting pain medicine, RN notified

## 2018-05-31 NOTE — ED Notes (Signed)
[facility] called for transport to [facility] ED

## 2018-06-06 ENCOUNTER — Encounter: Payer: Self-pay | Admitting: *Deleted

## 2018-06-07 ENCOUNTER — Encounter (HOSPITAL_COMMUNITY): Payer: Self-pay | Admitting: *Deleted

## 2018-06-07 ENCOUNTER — Inpatient Hospital Stay (HOSPITAL_COMMUNITY)
Admission: RE | Admit: 2018-06-07 | Discharge: 2018-06-22 | DRG: 559 | Disposition: A | Discharge status: Home / Self Care | Payer: Medicare HMO | Source: Other Acute Inpatient Hospital | Attending: Physical Medicine & Rehabilitation | Admitting: Physical Medicine & Rehabilitation

## 2018-06-07 ENCOUNTER — Other Ambulatory Visit (HOSPITAL_COMMUNITY): Payer: Self-pay | Admitting: Physical Medicine and Rehabilitation

## 2018-06-07 DIAGNOSIS — F1490 Cocaine use, unspecified, uncomplicated: Secondary | ICD-10-CM | POA: Diagnosis present

## 2018-06-07 DIAGNOSIS — M79609 Pain in unspecified limb: Secondary | ICD-10-CM | POA: Diagnosis not present

## 2018-06-07 DIAGNOSIS — F17290 Nicotine dependence, other tobacco product, uncomplicated: Secondary | ICD-10-CM | POA: Diagnosis not present

## 2018-06-07 DIAGNOSIS — N183 Chronic kidney disease, stage 3 (moderate): Secondary | ICD-10-CM | POA: Diagnosis present

## 2018-06-07 DIAGNOSIS — Z888 Allergy status to other drugs, medicaments and biological substances status: Secondary | ICD-10-CM

## 2018-06-07 DIAGNOSIS — R609 Edema, unspecified: Secondary | ICD-10-CM | POA: Diagnosis not present

## 2018-06-07 DIAGNOSIS — H6691 Otitis media, unspecified, right ear: Secondary | ICD-10-CM | POA: Diagnosis present

## 2018-06-07 DIAGNOSIS — S14127D Central cord syndrome at C7 level of cervical spinal cord, subsequent encounter: Secondary | ICD-10-CM

## 2018-06-07 DIAGNOSIS — J432 Centrilobular emphysema: Secondary | ICD-10-CM | POA: Diagnosis present

## 2018-06-07 DIAGNOSIS — D62 Acute posthemorrhagic anemia: Secondary | ICD-10-CM | POA: Diagnosis not present

## 2018-06-07 DIAGNOSIS — I169 Hypertensive crisis, unspecified: Secondary | ICD-10-CM | POA: Diagnosis not present

## 2018-06-07 DIAGNOSIS — Z803 Family history of malignant neoplasm of breast: Secondary | ICD-10-CM | POA: Diagnosis not present

## 2018-06-07 DIAGNOSIS — E871 Hypo-osmolality and hyponatremia: Secondary | ICD-10-CM | POA: Diagnosis not present

## 2018-06-07 DIAGNOSIS — Z800 Family history of malignant neoplasm of digestive organs: Secondary | ICD-10-CM | POA: Diagnosis not present

## 2018-06-07 DIAGNOSIS — F4310 Post-traumatic stress disorder, unspecified: Secondary | ICD-10-CM

## 2018-06-07 DIAGNOSIS — Z8042 Family history of malignant neoplasm of prostate: Secondary | ICD-10-CM

## 2018-06-07 DIAGNOSIS — G8250 Quadriplegia, unspecified: Secondary | ICD-10-CM

## 2018-06-07 DIAGNOSIS — I129 Hypertensive chronic kidney disease with stage 1 through stage 4 chronic kidney disease, or unspecified chronic kidney disease: Secondary | ICD-10-CM | POA: Diagnosis present

## 2018-06-07 DIAGNOSIS — W19XXXD Unspecified fall, subsequent encounter: Secondary | ICD-10-CM | POA: Diagnosis present

## 2018-06-07 DIAGNOSIS — G8254 Quadriplegia, C5-C7 incomplete: Secondary | ICD-10-CM | POA: Diagnosis not present

## 2018-06-07 DIAGNOSIS — Z79899 Other long term (current) drug therapy: Secondary | ICD-10-CM

## 2018-06-07 DIAGNOSIS — S14129S Central cord syndrome at unspecified level of cervical spinal cord, sequela: Secondary | ICD-10-CM

## 2018-06-07 DIAGNOSIS — Z4789 Encounter for other orthopedic aftercare: Principal | ICD-10-CM

## 2018-06-07 DIAGNOSIS — I10 Essential (primary) hypertension: Secondary | ICD-10-CM | POA: Diagnosis present

## 2018-06-07 DIAGNOSIS — S12600D Unspecified displaced fracture of seventh cervical vertebra, subsequent encounter for fracture with routine healing: Secondary | ICD-10-CM

## 2018-06-07 DIAGNOSIS — D709 Neutropenia, unspecified: Secondary | ICD-10-CM | POA: Diagnosis present

## 2018-06-07 DIAGNOSIS — F329 Major depressive disorder, single episode, unspecified: Secondary | ICD-10-CM | POA: Diagnosis present

## 2018-06-07 DIAGNOSIS — K592 Neurogenic bowel, not elsewhere classified: Secondary | ICD-10-CM | POA: Diagnosis not present

## 2018-06-07 DIAGNOSIS — G8252 Quadriplegia, C1-C4 incomplete: Secondary | ICD-10-CM | POA: Diagnosis not present

## 2018-06-07 DIAGNOSIS — Z736 Limitation of activities due to disability: Secondary | ICD-10-CM | POA: Diagnosis not present

## 2018-06-07 DIAGNOSIS — K5903 Drug induced constipation: Secondary | ICD-10-CM | POA: Diagnosis not present

## 2018-06-07 DIAGNOSIS — S12500D Unspecified displaced fracture of sixth cervical vertebra, subsequent encounter for fracture with routine healing: Secondary | ICD-10-CM | POA: Diagnosis not present

## 2018-06-07 DIAGNOSIS — S14129D Central cord syndrome at unspecified level of cervical spinal cord, subsequent encounter: Secondary | ICD-10-CM | POA: Diagnosis not present

## 2018-06-07 DIAGNOSIS — S14129A Central cord syndrome at unspecified level of cervical spinal cord, initial encounter: Secondary | ICD-10-CM | POA: Diagnosis present

## 2018-06-07 DIAGNOSIS — Z7409 Other reduced mobility: Secondary | ICD-10-CM | POA: Diagnosis not present

## 2018-06-07 DIAGNOSIS — R0989 Other specified symptoms and signs involving the circulatory and respiratory systems: Secondary | ICD-10-CM | POA: Diagnosis not present

## 2018-06-07 LAB — GLUCOSE, CAPILLARY
Glucose-Capillary: 99 mg/dL (ref 65–99)
Glucose-Capillary: 101 mg/dL — ABNORMAL HIGH (ref 65–99)

## 2018-06-07 MED ORDER — LATANOPROST 0.005 % OP SOLN
1.0000 [drp] | Freq: Every day | OPHTHALMIC | Status: DC
  Administered 2018-06-07 – 2018-06-21 (×15): 1 [drp] via OPHTHALMIC
  Filled 2018-06-07: qty 2.5

## 2018-06-07 MED ORDER — GENERIC EXTERNAL MEDICATION
1.00 | Status: DC
Start: ? — End: 2018-06-07

## 2018-06-07 MED ORDER — OXYCODONE HCL 5 MG PO TABS
5.00 | ORAL_TABLET | ORAL | Status: DC
Start: ? — End: 2018-06-07

## 2018-06-07 MED ORDER — ENOXAPARIN SODIUM 40 MG/0.4ML ~~LOC~~ SOLN
40.00 | SUBCUTANEOUS | Status: DC
Start: 2018-06-08 — End: 2018-06-07

## 2018-06-07 MED ORDER — ALUM & MAG HYDROXIDE-SIMETH 200-200-20 MG/5ML PO SUSP: 30.0000 mL | ORAL | Status: DC | PRN

## 2018-06-07 MED ORDER — ACETAMINOPHEN 325 MG PO TABS
650.00 | ORAL_TABLET | ORAL | Status: DC
Start: 2018-06-07 — End: 2018-06-07

## 2018-06-07 MED ORDER — AMLODIPINE BESYLATE 10 MG PO TABS
10.0000 mg | ORAL_TABLET | Freq: Every day | ORAL | Status: DC
  Administered 2018-06-08 – 2018-06-22 (×11): 10 mg via ORAL
  Filled 2018-06-07 (×15): qty 1

## 2018-06-07 MED ORDER — ENOXAPARIN SODIUM 40 MG/0.4ML ~~LOC~~ SOLN: 40.0000 mg | SUBCUTANEOUS | Status: DC

## 2018-06-07 MED ORDER — CIPROFLOXACIN-DEXAMETHASONE 0.3-0.1 % OT SUSP
4.00 | OTIC | Status: DC
Start: 2018-06-07 — End: 2018-06-07

## 2018-06-07 MED ORDER — METHOCARBAMOL 500 MG PO TABS
500.0000 mg | ORAL_TABLET | Freq: Four times a day (QID) | ORAL | Status: DC | PRN
  Administered 2018-06-07 – 2018-06-15 (×7): 500 mg via ORAL
  Filled 2018-06-07 (×7): qty 1

## 2018-06-07 MED ORDER — PROCHLORPERAZINE 25 MG RE SUPP: 12.5000 mg | Freq: Four times a day (QID) | RECTAL | Status: DC | PRN

## 2018-06-07 MED ORDER — CARVEDILOL 3.125 MG PO TABS
3.1250 mg | ORAL_TABLET | Freq: Two times a day (BID) | ORAL | Status: DC
  Administered 2018-06-07 – 2018-06-22 (×26): 3.125 mg via ORAL
  Filled 2018-06-07 (×29): qty 1

## 2018-06-07 MED ORDER — GABAPENTIN 100 MG PO CAPS
100.00 | ORAL_CAPSULE | ORAL | Status: DC
Start: 2018-06-07 — End: 2018-06-07

## 2018-06-07 MED ORDER — POLYETHYLENE GLYCOL 3350 17 G PO PACK
17.0000 g | PACK | Freq: Every day | ORAL | Status: DC
  Administered 2018-06-08 – 2018-06-22 (×15): 17 g via ORAL
  Filled 2018-06-07 (×15): qty 1

## 2018-06-07 MED ORDER — CLONIDINE HCL 0.2 MG PO TABS
0.2000 mg | ORAL_TABLET | Freq: Two times a day (BID) | ORAL | Status: DC
  Administered 2018-06-07 – 2018-06-22 (×25): 0.2 mg via ORAL
  Filled 2018-06-07 (×30): qty 1

## 2018-06-07 MED ORDER — SENNOSIDES-DOCUSATE SODIUM 8.6-50 MG PO TABS: 1.0000 | ORAL_TABLET | Freq: Every evening | ORAL | Status: DC | PRN

## 2018-06-07 MED ORDER — LIDOCAINE 5 % EX PTCH
1.00 | MEDICATED_PATCH | CUTANEOUS | Status: DC
Start: 2018-06-07 — End: 2018-06-07

## 2018-06-07 MED ORDER — POLYVINYL ALCOHOL 1.4 % OP SOLN
1.00 | OPHTHALMIC | Status: DC
Start: ? — End: 2018-06-07

## 2018-06-07 MED ORDER — PAROXETINE HCL 20 MG PO TABS
40.0000 mg | ORAL_TABLET | Freq: Every day | ORAL | Status: DC
  Administered 2018-06-08 – 2018-06-22 (×15): 40 mg via ORAL
  Filled 2018-06-07 (×15): qty 2

## 2018-06-07 MED ORDER — MELATONIN 3 MG PO TABS
3.0000 mg | ORAL_TABLET | Freq: Every day | ORAL | Status: DC
  Filled 2018-06-07: qty 1

## 2018-06-07 MED ORDER — DEXTROSE 50 % IV SOLN
12.50 | INTRAVENOUS | Status: DC
Start: ? — End: 2018-06-07

## 2018-06-07 MED ORDER — SENNOSIDES-DOCUSATE SODIUM 8.6-50 MG PO TABS
2.00 | ORAL_TABLET | ORAL | Status: DC
Start: 2018-06-07 — End: 2018-06-07

## 2018-06-07 MED ORDER — LIDOCAINE 5 % EX PTCH
1.0000 | MEDICATED_PATCH | CUTANEOUS | Status: DC
  Administered 2018-06-08 – 2018-06-22 (×15): 1 via TRANSDERMAL
  Filled 2018-06-07 (×15): qty 1

## 2018-06-07 MED ORDER — TRAZODONE HCL 50 MG PO TABS
50.00 | ORAL_TABLET | ORAL | Status: DC
Start: ? — End: 2018-06-07

## 2018-06-07 MED ORDER — DIPHENHYDRAMINE HCL 12.5 MG/5ML PO ELIX: 12.5000 mg | ORAL_SOLUTION | Freq: Four times a day (QID) | ORAL | Status: DC | PRN

## 2018-06-07 MED ORDER — GUAIFENESIN-DM 100-10 MG/5ML PO SYRP
5.0000 mL | ORAL_SOLUTION | Freq: Four times a day (QID) | ORAL | Status: DC | PRN
Start: 2018-06-07 — End: 2018-06-22

## 2018-06-07 MED ORDER — NON FORMULARY: 5.0000 mg | Freq: Every day | Status: DC

## 2018-06-07 MED ORDER — PAROXETINE HCL 20 MG PO TABS
40.00 | ORAL_TABLET | ORAL | Status: DC
Start: 2018-06-08 — End: 2018-06-07

## 2018-06-07 MED ORDER — POLYETHYLENE GLYCOL 3350 17 G PO PACK
17.00 | PACK | ORAL | Status: DC
Start: 2018-06-08 — End: 2018-06-07

## 2018-06-07 MED ORDER — FLEET ENEMA 7-19 GM/118ML RE ENEM: 1.0000 | ENEMA | Freq: Once | RECTAL | Status: DC | PRN

## 2018-06-07 MED ORDER — OXYCODONE HCL 5 MG PO TABS
5.0000 mg | ORAL_TABLET | ORAL | Status: DC | PRN
  Administered 2018-06-07 – 2018-06-16 (×8): 10 mg via ORAL
  Filled 2018-06-07 (×8): qty 2

## 2018-06-07 MED ORDER — TRAMADOL HCL 50 MG PO TABS
50.0000 mg | ORAL_TABLET | Freq: Four times a day (QID) | ORAL | Status: DC | PRN
  Administered 2018-06-08 – 2018-06-14 (×4): 50 mg via ORAL
  Filled 2018-06-07 (×4): qty 1

## 2018-06-07 MED ORDER — THIAMINE HCL 100 MG PO TABS
100.00 | ORAL_TABLET | ORAL | Status: DC
Start: 2018-06-08 — End: 2018-06-07

## 2018-06-07 MED ORDER — PROCHLORPERAZINE MALEATE 5 MG PO TABS: 5.0000 mg | ORAL_TABLET | Freq: Four times a day (QID) | ORAL | Status: DC | PRN

## 2018-06-07 MED ORDER — CIPROFLOXACIN-DEXAMETHASONE 0.3-0.1 % OT SUSP
4.0000 [drp] | Freq: Two times a day (BID) | OTIC | Status: DC
  Administered 2018-06-07 – 2018-06-22 (×28): 4 [drp] via OTIC
  Filled 2018-06-07 (×2): qty 7.5

## 2018-06-07 MED ORDER — ALBUTEROL SULFATE (2.5 MG/3ML) 0.083% IN NEBU: 2.5000 mg | INHALATION_SOLUTION | RESPIRATORY_TRACT | Status: DC | PRN

## 2018-06-07 MED ORDER — BISACODYL 10 MG RE SUPP
10.00 | RECTAL | Status: DC
Start: ? — End: 2018-06-07

## 2018-06-07 MED ORDER — CLONIDINE HCL 0.1 MG PO TABS
.10 | ORAL_TABLET | ORAL | Status: DC
Start: 2018-06-07 — End: 2018-06-07

## 2018-06-07 MED ORDER — BISACODYL 10 MG RE SUPP
10.0000 mg | Freq: Every day | RECTAL | Status: DC
  Administered 2018-06-08 – 2018-06-20 (×11): 10 mg via RECTAL
  Filled 2018-06-07 (×15): qty 1

## 2018-06-07 MED ORDER — FOLIC ACID 1 MG PO TABS
1.00 | ORAL_TABLET | ORAL | Status: DC
Start: 2018-06-08 — End: 2018-06-07

## 2018-06-07 MED ORDER — GENERIC EXTERNAL MEDICATION
Status: DC
Start: ? — End: 2018-06-07

## 2018-06-07 MED ORDER — ALUM & MAG HYDROXIDE-SIMETH 400-400-40 MG/5ML PO SUSP
30.00 | ORAL | Status: DC
Start: ? — End: 2018-06-07

## 2018-06-07 MED ORDER — HYDRALAZINE HCL 20 MG/ML IJ SOLN
10.00 | INTRAMUSCULAR | Status: DC
Start: ? — End: 2018-06-07

## 2018-06-07 MED ORDER — VITAMIN B-12 1000 MCG PO TABS
1000.0000 ug | ORAL_TABLET | Freq: Every day | ORAL | Status: DC
  Administered 2018-06-08 – 2018-06-22 (×15): 1000 ug via ORAL
  Filled 2018-06-07 (×15): qty 1

## 2018-06-07 MED ORDER — MELATONIN 3 MG PO TBDP
6.00 | ORAL_TABLET | ORAL | Status: DC
Start: 2018-06-07 — End: 2018-06-07

## 2018-06-07 MED ORDER — MELATONIN 3 MG PO TABS
6.0000 mg | ORAL_TABLET | Freq: Every day | ORAL | Status: DC
  Administered 2018-06-07 – 2018-06-21 (×15): 6 mg via ORAL
  Filled 2018-06-07 (×15): qty 2

## 2018-06-07 MED ORDER — ACETAMINOPHEN 325 MG PO TABS
325.0000 mg | ORAL_TABLET | ORAL | Status: DC | PRN
Start: 2018-06-07 — End: 2018-06-22
  Administered 2018-06-10 – 2018-06-22 (×8): 650 mg via ORAL
  Filled 2018-06-07 (×8): qty 2

## 2018-06-07 MED ORDER — TRAZODONE HCL 50 MG PO TABS
100.0000 mg | ORAL_TABLET | Freq: Every day | ORAL | Status: DC
  Administered 2018-06-07 – 2018-06-21 (×15): 100 mg via ORAL
  Filled 2018-06-07 (×15): qty 2

## 2018-06-07 MED ORDER — PRAZOSIN HCL 5 MG PO CAPS
5.00 | ORAL_CAPSULE | ORAL | Status: DC
Start: 2018-06-08 — End: 2018-06-07

## 2018-06-07 MED ORDER — ENOXAPARIN SODIUM 40 MG/0.4ML ~~LOC~~ SOLN
40.0000 mg | SUBCUTANEOUS | Status: DC
  Administered 2018-06-08 – 2018-06-21 (×14): 40 mg via SUBCUTANEOUS
  Filled 2018-06-07 (×14): qty 0.4

## 2018-06-07 MED ORDER — GABAPENTIN 100 MG PO CAPS
100.0000 mg | ORAL_CAPSULE | Freq: Every day | ORAL | Status: DC
  Administered 2018-06-07 – 2018-06-21 (×15): 100 mg via ORAL
  Filled 2018-06-07 (×15): qty 1

## 2018-06-07 MED ORDER — PROCHLORPERAZINE EDISYLATE 10 MG/2ML IJ SOLN: 5.0000 mg | Freq: Four times a day (QID) | INTRAMUSCULAR | Status: DC | PRN

## 2018-06-07 NOTE — Progress Notes (Signed)
To rehab unit alert and oriented patient via ambulance. Skin assessment done with Pam PA and [facility] LPN. Fall prevention discussed and X sign by patient due to both hands stiff. Soft touch call light given to patient. Oriented to unit set up.

## 2018-06-07 NOTE — Progress Notes (Signed)
Charlett Blake, MD  Physician  Physical Medicine and Rehabilitation  PMR Pre-admission  Signed  Date of Service:  06/07/2018 1:33 PM          Signed            Show:Clear all [x]Manual[x]Template[x]Copied  Added by: [x]Kirsteins, Luanna Salk, MD   []Hover for details   [employer] PMR Admission Coordinator Pre-Admission Assessment  Patient:Gregory Baldwinis an 72 y.o.,male UXL:[id number] DOB:15-May-1946 Height:5' 11" (180.3 cm) Weight:75.8 kg (167 lb)  Insurance Information ZDG:UYQIHK: PCP: IPA:80/20: OTHER:Gold Plus PRIMARY:Hum[employer] MedicarePolicy#:[id number] Subscriber:patient CM Name:Cherise WilliamsPhone#:[phone]#:[phone] Pre-Cert#:[id number] with update due in 7 daysEmployer:Not employed Benefits: Phone #:[phone]:Online portal Eff. Date:01/01/19Deduct:$0Out of Pocket Max:$3400 (met $0)Life Max:N/A CIR:$295 days 1-6SNF:$0 days 1-20; $172 days 21-100 Outpatient:medical necessityCo-Pay:$40/visit Home Health:100%Co-Pay:none DME:80%Co-Pay:20% Providers:in network  Medicaid Application Date:Case Manager: Disability Application Date:Case Worker:  Emergency [place]   Name Relation Home Work [place] F Daughter [phone]         Current Medical History Patient Admitting Diagnosis:Fall with+ LOC/TBI, Central cord syndromeat C4 level  History of Present Illness:A 72 yo male brought to [facility] ED and then transferred to [facility] ED on 05/31/18. Patient suffered a ground level fall following "lightheadedness" and then + LOC. Found to have C6 and C7 fractures with central cord syndrome at C4 level. He had bilateral upper and lower extremity motor weakness and sensory changes. On 06/01/18 patient  underwent urgent surgical intervention with C3-T1 posterior fusion by Dr. Macario Carls. Echo was negative during syncopal workup. PT/OT/SLP evaluations were completed with recommendations for acute inpatient rehab admission.  [facility] and physician.  Past Medical History     Past Medical History:  Diagnosis Date  . Anemia   . CKD (chronic kidney disease), stage III ([place])   . Elevated alkaline phosphatase level   . History of fracture of clavicle   . History of fracture of rib   . Hypertension   . Pancytopenia ([place])   . PTSD (post-traumatic stress disorder)    FOLLOWED AT [facility]  . Thrombocytopenia ([place])     Family History family history includes Breast cancer in his mother; Colon cancer in his father; Prostate cancer in his father.  Prior Rehab/Hospitalizations Has the patient had major surgery during 100 days prior to admission?No   Current Medications Dulcolax, Ciprodex, gabapentin, lidoderm, oxycodone, miralax, tylenol, ASA, albuterol, amlodipine, carvedilol, clonidine, Vit B 12, xalatan, Paroxetine, senokot-s, desyrel  Patients Current Diet:Regular diet  Precautions / Restrictions Precautions Precautions: Fall, Other (comment)(Neck precautions)  Has the patient had 2 or more falls or a fall with injury in the past year?Yes. Patient had 1 fall which resulted in current injuries.  Prior Activity Level Household: Mostly homebound, went to MD appointments. Not driving. Went out on the 3rd of each month to pay bills.  Prior Functional Level Self Care: Did the patient need help bathing, dressing, using the toilet or eating? Independent  Indoor Mobility: Did the patient need assistance with walking from room to room (with or without device)?Independent  Stairs: Did the patient need assistance with internal or external stairs (with  or without device)?Independent  Functional Cognition: Did the patient need help planning regular tasks such as shopping or remembering to take medications?Independent  Home Assistive Devices / Equipment Home Assistive Devices/Equipment: None  Prior Device Use: Indicate devices/aids used by the patient prior to current  illness, exacerbation or injury?None   Prior Functional Level Current Functional Level  Bed Mobility  Independent  Mod assist   Transfers  Independent  Mod assist   Mobility - Walk/Wheelchair  Independent  Mod assist   Upper Body Dressing  Independent  Mod assist   Lower Body Dressing  Independent  Total assist   Grooming  Independent  Min assist   Eating/Drinking  Independent  Other(Set up)   Toilet Transfer  Independent  Mod assist   Bladder Continence  WDL  Foley catheter   Bowel Management  WDL  BM 06/04/18   Stair Climbing  Independent Total assist   Communication  Verbal. Has 12th grade education.  Verbal   Memory  WDL  Mild impairment   Cooking/Meal Prep  Independent    Housework  Independent    Money Management  Independent    Driving  Not driving     Special needs/care consideration BiPAP/CPAPNo CPMNo Continuous Drip IVNo DialysisNo Life VestNo OxygenNo Special BedNo Trach Sizeno Wound Vac (area)No SkinPost op neck incision Bowel mgmt:Had a BM 06/04/18 Bladder mgmt:Foley catheter Diabetic mgmtNo  Previous Home Environment Living Arrangements: Alone Lives With: Alone Available Help at Discharge: Family, Available 24 hours/day Type of Home: Apartment Home Layout: One level Home Access: Level entry Additional Comments: Lived in apartment next to his brother.  Discharge Living Setting Plans for Discharge Living Setting: House, Lives with (comment)(Plans home with daughter and son in  law.) Type of Home at Discharge: House Discharge Home Layout: Two level, 1/2 bath on main level, Bed/bath upstairs Alternate Level Stairs-Number of Steps: 12 Discharge Home Access: Level entry  Social/Family/Support Systems Patient Roles: Parent, Other (Comment)(Has 3 daughters and a brother.) Contact Information: [name] - daughter Anticipated Caregiver: Daughter Anticipated Ambulance person Information: [name] - daughter - [phone] Ability/Limitations of Caregiver: Daughter works from her home and can assist/provide supervision after discharge Caregiver Availability: 24/7 Discharge Plan Discussed with Primary Caregiver: Yes Is Caregiver In Agreement with Plan?: Yes Does Caregiver/Family have Issues with Lodging/Transportation while Pt is in Rehab?: No  Goals/Additional Needs Patient/Family Goal for Rehab: 14-18 days Expected length of stay: PT/OT supervision, SLP I goals Cultural Considerations: None Equipment Needs: TBD Pt/Family Agrees to Admission and willing to participate: Yes Program Orientation Provided &Reviewed with Pt/Caregiver Including Roles &Responsibilities: Yes  Patient Condition:I have reviewed all clinical records, have spoken with case manager at [facility], and I have talked with patient's daughter. Patient can tolerate and will benefit from 3 hours of therapy a day. He needs the coordinated efforts of the rehab team in regain his functional independence in a timely manner. I have shared all information and progress with rehab MD and I have approval for acute inpatient rehab admission for today.  Preadmission Screen Completed LD:JTTSV, Eugenia M,6/20/201911:40 AM ______________________________________________________________________  Discussed status with Dr.Kirsteinson 6/20/19at 1139and received telephone approval for admission today.  Admission Coordinator:Logue, Evalee Mutton, time1139/Date6/20/19  Assessment/Plan: Diagnosis:  Central cord syndrome  1. Does the need for close, 24 hr/day Medical supervision in concert with the patient's rehab needs make it unreasonable for this patient to be served in a less intensive setting? Yes 2. Co-Morbidities requiring supervision/potential complications: HTN, CKD3 3. Due to bladder management, bowel management, safety, skin/wound care, disease management, medication administration, pain management and patient education, does the patient require 24 hr/day rehab nursing? Yes 4. Does the patient require coordinated care of a physician, rehab nurse, PT (1-2 hrs/day, 5 days/week) and OT (1-2 hrs/day, 5 days/week)to address physical and  functional deficits in the context of the above medical diagnosis(es)? Yes Addressing deficits in the following areas: balance, endurance, locomotion, strength, transferring, bowel/bladder control, bathing, dressing, grooming, toileting and psychosocial support 5. Can the patient actively participate in an intensive therapy program of at least 3 hrs of therapy 5 days a week? Yes 6. The potential for patient to make measurable gains while on inpatient rehab is good 7. Anticipated functional outcomes upon discharge from inpatients are: supervisionPT, supervision and min assistOT, n/aSLP 8. Estimated rehab length of stay to reach the above functional goals is: 14-18d 9. Does the patient have adequate social supports to accommodate these discharge functional goals? Yes 10. Anticipated D/C setting: Home 11. Anticipated post D/C treatments: [employer] [employer] therapy 12. Overall Rehab/Functional Prognosis: good    RECOMMENDATIONS: This patient's condition is appropriate for continued rehabilitative care in the following setting: CIR Patient has agreed to participate in recommended program. Yes Note that insurance prior authorization may be required for reimbursement for recommended care.   Charlett Blake M.D. [place] FAAPM&R (Sports  Med, Neuromuscular Med) Diplomate Am Board of Electrodiagnostic Med  Retta Diones 06/07/2018

## 2018-06-07 NOTE — PMR Pre-admission (Shared)
[facility] PMR Admission Coordinator Pre-Admission Assessment  Patient: Gregory Hurley is an 72 y.o., male MRN: [id number] DOB: 1946/09/30 Height: _0  (180.3 cm) Weight: 75.8 kg (167 lb)  Insurance Information HMO: Yes   PPO:       PCP:       IPA:       80/20:       OTHER:  Gold Plus PRIMARY: [employer] Medicare      Policy#: [id number]      Subscriber:  patient CM Name: Dollene Cleveland      Phone#: [phone]     Fax#: [phone] Pre-Cert#: [id number] with update due in 7 days      Employer:  Not employed Benefits:  Phone #: [phone]     Name:  Online portal Eff. Date: 12/19/17     Deduct:  $0      Out of Pocket Max: $3400 (met $0)      Life Max: N/A CIR: $295 days 1-6      SNF: $0 days 1-20; $172 days 21-100 Outpatient: medical necessity     Co-Pay: $40/visit Home Health: 100%      Co-Pay: none DME: 80%     Co-Pay: 20% Providers: in network  Medicaid Application Date:        Case Manager:   Disability Application Date:        Case Worker:    Emergency Facilities manager Information    Name Relation Home Work [place] F Daughter [phone]        Current Medical History  Patient Admitting Diagnosis: Fall with + LOC/TBI, Central cord syndrome at C4 level  History of Present Illness: A 72 yo male brought to [facility] ED and then transferred to [facility] ED on 05/31/18.  Patient suffered a ground level fall following "lightheadedness" and then + LOC.  Found to have C6 and C7 fractures with central cord syndrome at C4 level.  He had bilateral upper and lower extremity motor weakness and sensory changes.  On 06/01/18 patient underwent urgent surgical intervention with C3-T1 posterior fusion by Dr. Macario Carls.  Echo was negative during syncopal workup. PT/OT/SLP evaluations were completed with recommendations for acute inpatient rehab admission.  Patient's medical record from  [facility] has been reviewed by the rehabilitation admission  coordinator and physician.  Past Medical History  Past Medical History:  Diagnosis Date  . Anemia   . CKD (chronic kidney disease), stage III ([place])   . Elevated alkaline phosphatase level   . History of fracture of clavicle   . History of fracture of rib   . Hypertension   . Pancytopenia ([place])   . PTSD (post-traumatic stress disorder)    FOLLOWED AT [facility]  . Thrombocytopenia ([place])     Family History   family history includes Breast cancer in his mother; Colon cancer in his father; Prostate cancer in his father.  Prior Rehab/Hospitalizations Has the patient had major surgery during 100 days prior to admission? No    Current Medications Dulcolax, Ciprodex, gabapentin, lidoderm, oxycodone, miralax, tylenol, ASA, albuterol, amlodipine, carvedilol, clonidine, Vit B 12, xalatan, Paroxetine, senokot-s, desyrel  Patients Current Diet: Regular diet  Precautions / Restrictions Precautions Precautions: Fall, Other (comment)(Neck precautions)   Has the patient had 2 or more falls or a fall with injury in the past year?Yes.  Patient had 1 fall which resulted in current injuries.  Prior Activity Level Household: Mostly homebound, went to MD appointments.  Not driving.  Went out on the 3rd of each month to pay bills.  Prior Functional Level Self Care: Did the patient need help bathing, dressing, using the toilet or eating?  Independent  Indoor Mobility: Did the patient need assistance with walking from room to room (with or without device)? Independent  Stairs: Did the patient need assistance with internal or external stairs (with or without device)? Independent  Functional Cognition: Did the patient need help planning regular tasks such as shopping or remembering to take medications? Independent  Home Assistive Devices / Equipment Home Assistive Devices/Equipment: None  Prior Device Use: Indicate devices/aids used by the patient prior to current illness, exacerbation or  injury? None   Prior Functional Level Current Functional Level  Bed Mobility  Independent  Mod assist   Transfers  Independent  Mod assist   Mobility - Walk/Wheelchair  Independent  Mod assist   Upper Body Dressing  Independent  Mod assist   Lower Body Dressing  Independent  Total assist   Grooming  Independent  Min assist   Eating/Drinking  Independent  Other(Set up)   Toilet Transfer  Independent  Mod assist   Bladder Continence   WDL  Foley catheter   Bowel Management  WDL  BM 06/04/18   Stair Climbing  Independent Total assist   Communication  Verbal.  Has 12th grade education.  Verbal   Memory  WDL  Mild impairment   Cooking/Meal Prep  Independent      Housework  Independent    Money Management  Independent    Driving  Not driving     Special needs/care consideration BiPAP/CPAP No CPM No Continuous Drip IV No Dialysis No        Life Vest No Oxygen No Special Bed No Trach Size no Wound Vac (area) No      Skin Post op neck incision                             Bowel mgmt: Had a BM 06/04/18 Bladder mgmt: Foley catheter Diabetic mgmt No  Previous Home Environment Living Arrangements: Alone  Lives With: Alone Available Help at Discharge: Family, Available 24 hours/day Type of Home: Apartment Home Layout: One level Home Access: Level entry Additional Comments: Lived in apartment next to his brother.  Discharge Living Setting Plans for Discharge Living Setting: House, Lives with (comment)(Plans home with daughter and son in law.) Type of Home at Discharge: House Discharge Home Layout: Two level, 1/2 bath on main level, Bed/bath upstairs Alternate Level Stairs-Number of Steps: 12 Discharge Home Access: Level entry  Social/Family/Support Systems Patient Roles: Parent, Other (Comment)(Has 3 daughters and a brother.) Contact Information: [name] - daughter Anticipated Caregiver: Daughter Anticipated Caregiver's Contact  Information: [name] - daughter - [phone] Ability/Limitations of Caregiver: Daughter works from her home and can assist/provide supervision after discharge Caregiver Availability: 24/7 Discharge Plan Discussed with Primary Caregiver: Yes Is Caregiver In Agreement with Plan?: Yes Does Caregiver/Family have Issues with Lodging/Transportation while Pt is in Rehab?: No  Goals/Additional Needs Patient/Family Goal for Rehab: 14-18 days Expected length of stay: PT/OT supervision, SLP I goals Cultural Considerations: None Equipment Needs: TBD Pt/Family Agrees to Admission and willing to participate: Yes Program Orientation Provided & Reviewed with Pt/Caregiver Including Roles  & Responsibilities: Yes  Patient Condition: I have reviewed all clinical records, have spoken with case manager at [facility], and I have talked with patient's daughter.  Patient can tolerate and will benefit  from 3 hours of therapy a day.  He needs the coordinated efforts of the rehab team in regain his functional independence in a timely manner.  I have shared all information and progress with rehab MD and I have approval for acute inpatient rehab admission for today.  Preadmission Screen Completed By:  Retta Diones, 06/07/2018 11:40 AM ______________________________________________________________________   Discussed status with Dr. Letta Pate on 06/07/18 at 1139 and received telephone approval for admission today.  Admission Coordinator:  Retta Diones, time 1139/Date 06/07/18   Assessment/Plan: Diagnosis: 1. Does the need for close, 24 hr/day  Medical supervision in concert with the patient's rehab needs make it unreasonable for this patient to be served in a less intensive setting? {yes_no_potentially:3041433} 2. Co-Morbidities requiring supervision/potential complications: *** 3. Due to {due AJ:[id number]}, does the patient require 24 hr/day rehab nursing? {yes_no_potentially:3041433} 4. Does the patient require  coordinated care of a physician, rehab nurse, {coordinated HMCN:4709628} to address physical and functional deficits in the context of the above medical diagnosis(es)? {yes_no_potentially:3041433} Addressing deficits in the following areas: {deficits:3041436} 5. Can the patient actively participate in an intensive therapy program of at least 3 hrs of therapy 5 days a week? {yes_no_potentially:3041433} 6. The potential for patient to make measurable gains while on inpatient rehab is {potential:3041437} 7. Anticipated functional outcomes upon discharge from inpatients are: {functional outcomes:304600100} PT, {functional outcomes:304600100} OT, {functional outcomes:304600100} SLP 8. Estimated rehab length of stay to reach the above functional goals is: *** 9. Does the patient have adequate social supports to accommodate these discharge functional goals? {yes_no_potentially:3041433} 10. Anticipated D/C setting: {anticipated dc setting:21604} 11. Anticipated post D/C treatments: {post dc treatment:21605} 12. Overall Rehab/Functional Prognosis: {potential:3041437}    RECOMMENDATIONS: This patient's condition is appropriate for continued rehabilitative care in the following setting: {appropriate setting:21606} Patient has agreed to participate in recommended program. {yes_no_potentially:3041433} Note that insurance prior authorization may be required for reimbursement for recommended care.  Comment:  Retta Diones 06/07/2018

## 2018-06-07 NOTE — Plan of Care (Signed)
  Problem: SCI BOWEL ELIMINATION Goal: RH STG MANAGE BOWEL WITH ASSISTANCE Description STG Manage Bowel with Assistance.min assist  Outcome: Not Progressing LBM 6/18 after enema

## 2018-06-07 NOTE — PMR Pre-admission (Signed)
[facility] PMR Admission Coordinator Pre-Admission Assessment  Patient: Gregory Hurley is an 72 y.o., male MRN: [id number] DOB: Feb 12, 1946 Height: '5\' 11"'$  (180.3 cm) Weight: 75.8 kg (167 lb)  Insurance Information HMO: Yes   PPO:       PCP:       IPA:       80/20:       OTHER:  Gold Plus PRIMARY: [employer] Medicare      Policy#: [id number]      Subscriber:  patient CM Name: Dollene Cleveland      Phone#: [phone]     Fax#: [phone] Pre-Cert#: [id number] with update due in 7 days      Employer:  Not employed Benefits:  Phone #: [phone]     Name:  Online portal Eff. Date: 12/19/17     Deduct:  $0      Out of Pocket Max: $3400 (met $0)      Life Max: N/A CIR: $295 days 1-6      SNF: $0 days 1-20; $172 days 21-100 Outpatient: medical necessity     Co-Pay: $40/visit Home Health: 100%      Co-Pay: none DME: 80%     Co-Pay: 20% Providers: in network  Medicaid Application Date:        Case Manager:   Disability Application Date:        Case Worker:    Emergency Publishing copy Information    Name Relation Home Work [place] F Daughter [phone]        Current Medical History  Patient Admitting Diagnosis: Fall with + LOC/TBI, Central cord syndrome at C4 level  History of Present Illness: A 72 yo male brought to [facility] ED and then transferred to [facility] ED on 05/31/18.  Patient suffered a ground level fall following "lightheadedness" and then + LOC.  Found to have C6 and C7 fractures with central cord syndrome at C4 level.  He had bilateral upper and lower extremity motor weakness and sensory changes.  On 06/01/18 patient underwent urgent surgical intervention with C3-T1 posterior fusion by Dr. Macario Carls.  Echo was negative during syncopal workup. PT/OT/SLP evaluations were completed with recommendations for acute inpatient rehab admission.  Patient's medical record from  [facility] has been reviewed by the rehabilitation  admission coordinator and physician.  Past Medical History      Past Medical History:  Diagnosis Date  . Anemia   . CKD (chronic kidney disease), stage III ([place])   . Elevated alkaline phosphatase level   . History of fracture of clavicle   . History of fracture of rib   . Hypertension   . Pancytopenia ([place])   . PTSD (post-traumatic stress disorder)    FOLLOWED AT [facility]  . Thrombocytopenia ([place])     Family History   family history includes Breast cancer in his mother; Colon cancer in his father; Prostate cancer in his father.  Prior Rehab/Hospitalizations Has the patient had major surgery during 100 days prior to admission? No               Current Medications Dulcolax, Ciprodex, gabapentin, lidoderm, oxycodone, miralax, tylenol, ASA, albuterol, amlodipine, carvedilol, clonidine, Vit B 12, xalatan, Paroxetine, senokot-s, desyrel  Patients Current Diet: Regular diet  Precautions / Restrictions Precautions Precautions: Fall, Other (comment)(Neck precautions)   Has the patient had 2 or more falls or a fall with injury in the past year?Yes.  Patient  had 1 fall which resulted in current injuries.  Prior Activity Level Household: Mostly homebound, went to MD appointments.  Not driving.  Went out on the 3rd of each month to pay bills.  Prior Functional Level Self Care: Did the patient need help bathing, dressing, using the toilet or eating?  Independent  Indoor Mobility: Did the patient need assistance with walking from room to room (with or without device)? Independent  Stairs: Did the patient need assistance with internal or external stairs (with or without device)? Independent  Functional Cognition: Did the patient need help planning regular tasks such as shopping or remembering to take medications? Independent  Home Assistive Devices / Equipment Home Assistive Devices/Equipment: None  Prior Device Use: Indicate devices/aids used by the  patient prior to current illness, exacerbation or injury? None   Prior Functional Level Current Functional Level  Bed Mobility  Independent  Mod assist   Transfers  Independent  Mod assist   Mobility - Walk/Wheelchair  Independent  Mod assist   Upper Body Dressing  Independent  Mod assist   Lower Body Dressing  Independent  Total assist   Grooming  Independent  Min assist   Eating/Drinking  Independent  Other(Set up)   Toilet Transfer  Independent  Mod assist   Bladder Continence   WDL  Foley catheter   Bowel Management  WDL  BM 06/04/18   Stair Climbing  Independent Total assist   Communication  Verbal.  Has 12th grade education.  Verbal   Memory  WDL  Mild impairment   Cooking/Meal Prep  Independent      Housework  Independent    Money Management  Independent    Driving  Not driving     Special needs/care consideration BiPAP/CPAP No CPM No Continuous Drip IV No Dialysis No        Life Vest No Oxygen No Special Bed No Trach Size no Wound Vac (area) No      Skin Post op neck incision                             Bowel mgmt: Had a BM 06/04/18 Bladder mgmt: Foley catheter Diabetic mgmt No  Previous Home Environment Living Arrangements: Alone  Lives With: Alone Available Help at Discharge: Family, Available 24 hours/day Type of Home: Apartment Home Layout: One level Home Access: Level entry Additional Comments: Lived in apartment next to his brother.  Discharge Living Setting Plans for Discharge Living Setting: House, Lives with (comment)(Plans home with daughter and son in law.) Type of Home at Discharge: House Discharge Home Layout: Two level, 1/2 bath on main level, Bed/bath upstairs Alternate Level Stairs-Number of Steps: 12 Discharge Home Access: Level entry  Social/Family/Support Systems Patient Roles: Parent, Other (Comment)(Has 3 daughters and a  brother.) Contact Information: [name] - daughter Anticipated Caregiver: Daughter Anticipated Caregiver's Contact Information: [name] - daughter - [phone] Ability/Limitations of Caregiver: Daughter works from her home and can assist/provide supervision after discharge Caregiver Availability: 24/7 Discharge Plan Discussed with Primary Caregiver: Yes Is Caregiver In Agreement with Plan?: Yes Does Caregiver/Family have Issues with Lodging/Transportation while Pt is in Rehab?: No  Goals/Additional Needs Patient/Family Goal for Rehab: 14-18 days Expected length of stay: PT/OT supervision, SLP I goals Cultural Considerations: None Equipment Needs: TBD Pt/Family Agrees to Admission and willing to participate: Yes Program Orientation Provided & Reviewed with Pt/Caregiver Including Roles  & Responsibilities: Yes  Patient Condition: I have reviewed all clinical  records, have spoken with case manager at [facility], and I have talked with patient's daughter.  Patient can tolerate and will benefit from 3 hours of therapy a day.  He needs the coordinated efforts of the rehab team in regain his functional independence in a timely manner.  I have shared all information and progress with rehab MD and I have approval for acute inpatient rehab admission for today.  Preadmission Screen Completed By:  Retta Diones, 06/07/2018 11:40 AM ______________________________________________________________________   Discussed status with Dr. Letta Pate on 06/07/18 at 1139 and received telephone approval for admission today.  Admission Coordinator:  Retta Diones, time 1139/Date 06/07/18   Assessment/Plan: Diagnosis: Central cord syndrome  1. Does the need for close, 24 hr/day  Medical supervision in concert with the patient's rehab needs make it unreasonable for this patient to be served in a less intensive setting? Yes 2. Co-Morbidities requiring supervision/potential complications: HTN, CKD3 3. Due to  bladder management, bowel management, safety, skin/wound care, disease management, medication administration, pain management and patient education, does the patient require 24 hr/day rehab nursing? Yes 4. Does the patient require coordinated care of a physician, rehab nurse, PT (1-2 hrs/day, 5 days/week) and OT (1-2 hrs/day, 5 days/week) to address physical and functional deficits in the context of the above medical diagnosis(es)? Yes Addressing deficits in the following areas: balance, endurance, locomotion, strength, transferring, bowel/bladder control, bathing, dressing, grooming, toileting and psychosocial support 5. Can the patient actively participate in an intensive therapy program of at least 3 hrs of therapy 5 days a week? Yes 6. The potential for patient to make measurable gains while on inpatient rehab is good 7. Anticipated functional outcomes upon discharge from inpatients are: supervision PT, supervision and min assist OT, n/a SLP 8. Estimated rehab length of stay to reach the above functional goals is: 14-18d 9. Does the patient have adequate social supports to accommodate these discharge functional goals? Yes 10. Anticipated D/C setting: Home 11. Anticipated post D/C treatments: [place] therapy 12. Overall Rehab/Functional Prognosis: good    RECOMMENDATIONS: This patient's condition is appropriate for continued rehabilitative care in the following setting: CIR Patient has agreed to participate in recommended program. Yes Note that insurance prior authorization may be required for reimbursement for recommended care.   Charlett Blake M.D. [place] FAAPM&R (Sports Med, Neuromuscular Med) Diplomate Am Board of Electrodiagnostic Med  Retta Diones 06/07/2018

## 2018-06-07 NOTE — H&P (Addendum)
Physical Medicine and Rehabilitation Admission H&P     CC: Fall with central cord syndrome and functional deficits.     HPI:  Gregory Hurley is a 72 year old male with history of CKD, HTN, PTSD who was admitted to [facility] on 05/31/18 after fall with numbness and tingling BUE/BLE as well as weakness BUE/RLE>LLE  due to multiple cervical fractures. He reported feeling lightheaded followed by unresponsive episode and was down about 2 hours prior to calling EMS. UDS positive for cocaine, opiates and THC.  Work up revealed ankylosed spine with C6/C7 fracture with instability, cord contusion C5-C6, diffuse epidural hematoma from foramen magnum into thoracic spinal canal, right gluteus maximus hematoma and severe centrilobular emphysema. He was taken to OR for C3-T1 PSF with decompression by Dr. Binnie Rail. Hospital course significant for acute hypoxic respiratory failure with hypercarbia, hyponatremia, ABLA and right ear pain treated with ciprodex.  Patient with incomplete quadriparesis as well as problems with processing affecting functional status and CIR recommended for follow up therapy.      Review of Systems  Constitutional: Negative for chills and fever.  HENT: Negative for hearing loss and tinnitus.   Eyes: Positive for blurred vision (needs new glasses).  Respiratory: Negative for cough and shortness of breath.   Cardiovascular: Negative for chest pain and palpitations.  Gastrointestinal: Positive for constipation (Last BM 2 days ago after enema). Negative for heartburn and nausea.  Genitourinary: Negative for dysuria and urgency.  Musculoskeletal: Positive for falls (6 in the last 3 months--due to being dizzy).  Skin: Negative for rash.  Neurological: Positive for dizziness (due to elevated BP?), sensory change (BUE and from the waist down to BLE), focal weakness and weakness.  Psychiatric/Behavioral: Negative for depression. The patient has insomnia. The patient is not nervous/anxious.            Past Medical History:  Diagnosis Date  . Anemia    . CKD (chronic kidney disease), stage III ([place])    . Elevated alkaline phosphatase level    . History of fracture of clavicle    . History of fracture of rib    . Hypertension    . Pancytopenia ([place])    . PTSD (post-traumatic stress disorder)      FOLLOWED AT [facility]  . Thrombocytopenia ([place])             Past Surgical History:  Procedure Laterality Date  . NO PAST SURGERIES             Family History  Problem Relation Age of Onset  . Breast cancer Mother    . Colon cancer Father    . Prostate cancer Father        Social History: Lives alone. Independent PTA.  He reports that he has been smoking 10 cigars daily.  He has a 50.00 pack-year smoking history. He has never used smokeless tobacco. He reports that he drinks alcohol 4-5 beers/day. He smokes 2 joints per day and uses cocaine 1-2 x week.            Allergies  Allergen Reactions  . Ace Inhibitors        Other reaction(s): Angioedema      Medications Prior to admission: Norvasc 10 mg daily Coreg  25 mg bid ASA 81 mg daily Albuterol MDI 2 puffs qid prn Paxil 40 mg daily Prazosin 6 mg at bedtime Spironolactone 25 mg daily Vitamin B 12 1000 mcg daily Senna S daily Melatonin 5 mg HS Catapres  0.2 mg bid       Drug Regimen Review  Drug regimen was reviewed and remains appropriate with no significant issues identified   Home: One level with 6 STE   Functional History: Independent PTA.    Functional Status:  Mobility: Mod assist with bed mobility Mod assist with transfers RLE instability in standing.      ADL: Min assist to wash face Set up assist with built up utensils for feeding Total assist for LB dressing.    Cognition: Needs cues for attention.       Physical Exam: There were no vitals taken for this visit. Physical Exam  Nursing note and vitals reviewed. Neck:  Posterior neck incision C/D/I with staples in place.    Musculoskeletal: He exhibits tenderness. He exhibits no edema.  Neurological: He displays abnormal reflex. Coordination abnormal.  Lower half abdomen hypersensitive to touch.    Lungs clear to auscultation Abdomen positive bowel sounds soft nontender palpation Heart regular rate and rhythm no rubs murmurs or extra sounds Neurologic Intact sensation to light touch and proprioception bilateral hands and feet. Motor Right upper extremity trace deltoid bicep to minus tricep trace finger flexion 0 finger extension Left upper extremity 4- left deltoid bicep 3/5 left tricep 2- left finger flexors and extensors as well as wrist flexors and extensors Right lower extremity 3- hip flexor 4 knee extensor 4 ankle dorsiflexor Left lower extremity 3- hip flexor 4- knee extensor 4/5 ankle dorsiflexor   Lab Results Last 48 Hours  No results found for this or any previous visit (from the past 48 hour(s)).   Imaging Results (Last 48 hours)  No results found.           Medical Problem List and Plan: 1.  Tetraparesis, secondary to Central cord syndrome 2.  DVT Prophylaxis/Anticoagulation: Pharmaceutical: Lovenox 3. Pain Management: Oxycodone prn. Continue Neurontin at bedtime   4. Mood: LCSW to follow for evaluation and support.  5. Neuropsych: This patient is capable of making decisions on his own behalf. 6. Skin/Wound Care: routine pressure relief measures.  7. Fluids/Electrolytes/Nutrition: Monitor I/O. Check lytes in am. 8. HTN: Poorly controlled at baseline with SBP up to 200's. Monitor BID. Continue norvasc, coreg bid and clonidine 9. Right ear infection?: continue ciprodex bid.  10. Constipation: continue Miralax daily and add suppository daily. 11. PTSD with anxiety/depression/nightmares: Managed with Paxil for mood stabilization and trazodone/melatonin for sleep.  12. Emphysema: On albuterol prn.  13. ABLA: Continue to monitor--most recent H/H--8.8/27.2 14. Hyponatremia: Improving from  127-->133. Continue to monitor.  Recheck in am.     Post Admission Physician Evaluation: 1. Functional deficits secondary  to Tetraparesis. 2. Patient admitted to receive collaborative, interdisciplinary care between the physiatrist, rehab nursing staff, and therapy team. 3. Patient's level of medical complexity and substantial therapy needs in context of that medical necessity cannot be provided at a lesser intensity of care. 4. Patient has experienced substantial functional loss from his/her baseline. Judging by the patient's diagnosis, physical exam, and functional history, the patient has potential for functional progress which will result in measurable gains while on inpatient rehab.  These gains will be of substantial and practical use upon discharge in facilitating mobility and self-care at the household level. 5. Physiatrist will provide 24 hour management of medical needs as well as oversight of the therapy plan/treatment and provide guidance as appropriate regarding the interaction of the two. 6. 24 hour rehab nursing will assist in the management of  bladder management, bowel  management, safety, skin/wound care, disease management, medication administration, pain management and patient education  and help integrate therapy concepts, techniques,education, etc. 7. PT will assess and treat for:pre gait, gait training, endurance , safety, equipment, neuromuscular re education  .  Goals are: supervision. 8. OT will assess and treat for ADLs, Cognitive perceptual skills, Neuromuscular re education, safety, endurance, equipment  .  Goals are: supervision.  9. SLP will assess and treat for Cognition, Brain injury screen  .  Goals are: supervision. 10. Case Management and Social Worker will assess and treat for psychological issues and discharge planning. 11. Team conference will be held weekly to assess progress toward goals and to determine barriers to discharge. 12.  Patient will receive at least  3 hours of therapy per day at least 5 days per week. 13. ELOS and Prognosis: 14-18d good      "I have personally performed a face to face diagnostic evaluation of this patient.  Additionally, I have reviewed and concur with the physician assistant's documentation above."  Charlett Blake M.D. [facility] FAAPM&R (Sports Med, Neuromuscular Med) Diplomate [place] Board of [place], PA-C 06/07/2018

## 2018-06-08 ENCOUNTER — Inpatient Hospital Stay (HOSPITAL_COMMUNITY): Payer: Medicare HMO

## 2018-06-08 ENCOUNTER — Encounter (HOSPITAL_COMMUNITY): Payer: Self-pay

## 2018-06-08 ENCOUNTER — Inpatient Hospital Stay (HOSPITAL_COMMUNITY): Payer: PPO

## 2018-06-08 ENCOUNTER — Inpatient Hospital Stay (HOSPITAL_COMMUNITY): Payer: PPO | Admitting: Physical Therapy

## 2018-06-08 ENCOUNTER — Other Ambulatory Visit: Payer: Self-pay

## 2018-06-08 DIAGNOSIS — M79609 Pain in unspecified limb: Secondary | ICD-10-CM

## 2018-06-08 DIAGNOSIS — R609 Edema, unspecified: Secondary | ICD-10-CM

## 2018-06-08 LAB — CBC WITH DIFFERENTIAL/PLATELET
Abs Immature Granulocytes: 0.1 10*3/uL (ref 0.0–0.1)
Basophils Absolute: 0 10*3/uL (ref 0.0–0.1)
Basophils Relative: 0 %
EOS ABS: 0.1 10*3/uL (ref 0.0–0.7)
Eosinophils Relative: 2 %
HCT: 25.6 % — ABNORMAL LOW (ref 39.0–52.0)
HEMOGLOBIN: 8.1 g/dL — AB (ref 13.0–17.0)
IMMATURE GRANULOCYTES: 1 %
LYMPHS PCT: 17 %
Lymphs Abs: 0.9 10*3/uL (ref 0.7–4.0)
MCH: 27.3 pg (ref 26.0–34.0)
MCHC: 31.6 g/dL (ref 30.0–36.0)
MCV: 86.2 fL (ref 78.0–100.0)
Monocytes Absolute: 0.7 10*3/uL (ref 0.1–1.0)
Monocytes Relative: 13 %
NEUTROS ABS: 3.5 10*3/uL (ref 1.7–7.7)
NEUTROS PCT: 67 %
Platelets: 190 10*3/uL (ref 150–400)
RBC: 2.97 MIL/uL — AB (ref 4.22–5.81)
RDW: 15.1 % (ref 11.5–15.5)
WBC: 5.2 10*3/uL (ref 4.0–10.5)

## 2018-06-08 LAB — COMPREHENSIVE METABOLIC PANEL
ALT: 16 U/L — ABNORMAL LOW (ref 17–63)
ANION GAP: 5 (ref 5–15)
AST: 15 U/L (ref 15–41)
Albumin: 2.6 g/dL — ABNORMAL LOW (ref 3.5–5.0)
Alkaline Phosphatase: 61 U/L (ref 38–126)
BUN: 20 mg/dL (ref 6–20)
CO2: 27 mmol/L (ref 22–32)
Calcium: 8.1 mg/dL — ABNORMAL LOW (ref 8.9–10.3)
Chloride: 102 mmol/L (ref 101–111)
Creatinine, Ser: 1.2 mg/dL (ref 0.61–1.24)
GFR, EST NON AFRICAN AMERICAN: 59 mL/min — AB (ref >60)
Glucose, Bld: 98 mg/dL (ref 65–99)
POTASSIUM: 4.3 mmol/L (ref 3.5–5.1)
Sodium: 134 mmol/L — ABNORMAL LOW (ref 135–145)
Total Bilirubin: 0.6 mg/dL (ref 0.3–1.2)
Total Protein: 4.8 g/dL — ABNORMAL LOW (ref 6.5–8.1)

## 2018-06-08 LAB — GLUCOSE, CAPILLARY
GLUCOSE-CAPILLARY: 96 mg/dL (ref 65–99)
GLUCOSE-CAPILLARY: 153 mg/dL — AB (ref 65–99)

## 2018-06-08 MED ORDER — GENERIC EXTERNAL MEDICATION
Status: DC
Start: ? — End: 2018-06-08

## 2018-06-08 NOTE — Evaluation (Signed)
Speech Language Pathology Assessment and Plan  Patient Details  Name: Collin Rengel MRN: [id number] Date of Birth: December 07, 1946  Evaluation Only  Today's Date: 06/08/2018 SLP Individual Time: 1030-1100 SLP Individual Time Calculation (min): 30 min   Problem List:  Patient Active Problem List   Diagnosis Date Noted  . Central cord syndrome ([place]) 06/07/2018  . Syncope 07/07/2017  . HTN (hypertension) 07/07/2017  . Depression 07/07/2017  . CKD (chronic kidney disease), stage III ([place]) 07/07/2017  . Hyponatremia 07/07/2017  . Hypokalemia 07/07/2017   Past Medical History:  Past Medical History:  Diagnosis Date  . Anemia   . CKD (chronic kidney disease), stage III ([place])   . Elevated alkaline phosphatase level   . History of fracture of clavicle   . History of fracture of rib   . Hypertension   . Pancytopenia ([place])   . PTSD (post-traumatic stress disorder)    FOLLOWED AT [facility]  . Thrombocytopenia ([place])    Past Surgical History:  Past Surgical History:  Procedure Laterality Date  . NO PAST SURGERIES      Assessment / Plan / Recommendation Clinical Impression Dago Jungwirth is a 72 year old male with history of CKD, HTN, PTSD who was admitted to [facility] on 05/31/18 after fall with numbness and tingling BUE/BLE as well as weakness BUE/RLE>LLE due to multiple cervical fractures. He reported feeling lightheaded followed by unresponsive episode and was down about 2 hours prior to calling EMS. UDS positive for cocaine, opiates and THC. Work up revealed ankylosed spine with C6/C7 fracture with instability, cord contusion C5-C6, diffuse epidural hematoma from foramen magnum into thoracic spinal canal, right gluteus maximus hematoma and severe centrilobular emphysema. He was taken to OR for C3-T1 PSF with decompression by Dr. Binnie Rail. Hospital course significant for acute hypoxic respiratory failure with hypercarbia, hyponatremia, ABLA and right ear pain treated with ciprodex. Patient  with incomplete quadriparesis as well as problems with processing affecting functional status and CIR recommended for follow up therapy. Pt admited to CIR on 06/07/18 with cognitive linguistic evaluation completed on 06/08/18. Pt demonstrates functional cognitive abilities as further evidenced by score of 18 out of 22 on MOCA Blind (administered d/t RUE weakness). Pt reports baseline memory deficits and is able to report how he manages these deficits within home environment. Additionally, evaluation was interrupted numerous times and yet pt was able to maintain attention to task for correct completion. Skilled ST services are not indicated at this time.    Skilled Therapeutic Interventions          Education provided on anticipatory awareness and pt has discharge plan of staying with daughter should his physical deficits persist.    SLP Assessment  Patient does not need any further Speech [place] [facility] Services    Recommendations  Patient destination: Home Follow up Recommendations: None           Pain Pain Assessment Pain Scale: 0-10 Pain Score: 0-No pain  Prior Functioning Cognitive/Linguistic Baseline: Within functional limits(pt reports baseline memory deficits likely d/t ETOH use) Type of Home: Apartment  Lives With: Alone Available Help at Discharge: Family;Available 24 hours/day Education: high school Vocation: Retired  Function:    Cognition Comprehension Comprehension assist level: Follows complex conversation/direction with no assist  Expression   Expression assist level: Expresses complex ideas: With no assist  Social Interaction Social Interaction assist level: Interacts appropriately with others - No medications needed.  Problem Solving Problem solving assist level: Solves complex problems: With extra time  Memory Memory assist level: More than reasonable amount of time(pt reports memory deficits at baseline)   Short Term Goals: No short term goals  set  Refer to Care Plan for Long Term Goals  Recommendations for other services: None   Discharge Criteria: Patient will be discharged from SLP if patient refuses treatment 3 consecutive times without medical reason, if treatment goals not met, if there is a change in medical status, if patient makes no progress towards goals or if patient is discharged from hospital.  The above assessment, treatment plan, treatment alternatives and goals were discussed and mutually agreed upon: by patient  Shalice Woodring 06/08/2018, 11:09 AM

## 2018-06-08 NOTE — Progress Notes (Signed)
[place] PHYSICAL MEDICINE & REHABILITATION     PROGRESS NOTE    Subjective/Complaints: Patient states that he had a reasonable night.  Brace to get started with therapy.  Moved his bowels and bladder this morning continently in bathroom  ROS: Patient denies fever, rash, sore throat, blurred vision, nausea, vomiting, diarrhea, cough, shortness of breath or chest pain, joint or back pain, headache, or mood change.   Objective:  No results found. Recent Labs    06/08/18 0645  WBC 5.2  HGB 8.1*  HCT 25.6*  PLT 190   Recent Labs    06/08/18 0645  NA 134*  K 4.3  CL 102  GLUCOSE 98  BUN 20  CREATININE 1.20  CALCIUM 8.1*   CBG (last 3)  Recent Labs    06/07/18 1833 06/07/18 2223 06/08/18 0706  GLUCAP 99 101* 96    Wt Readings from Last 3 Encounters:  06/07/18 75.8 kg (167 lb)  05/31/18 77.1 kg (170 lb)  07/07/17 77.1 kg (170 lb)     Intake/Output Summary (Last 24 hours) at 06/08/2018 1141 Last data filed at 06/08/2018 0900 Gross per 24 hour  Intake 240 ml  Output 1300 ml  Net -1060 ml    Vital Signs: Blood pressure (!) 141/50, pulse 67, temperature 98.9 F (37.2 C), temperature source Oral, resp. rate 18, SpO2 100 %. Physical Exam:  Constitutional: No distress . Vital signs reviewed. HEENT: EOMI, oral membranes moist, large cyst on head Neck: Posterior neck incision clean and intact with staples.  Patient wearing soft cervical collar Cardiovascular: RRR without murmur. No JVD    Respiratory: CTA Bilaterally without wheezes or rales. Normal effort    GI: BS +, non-tender, non-distended  Musculoskeletal: He exhibitssome sensitivity to touch in his arms and legs as well as trunk  Neurologic Decreased light touch and pain sensation in both arms and legs (1/2). Motor right upper extremity is 4 out of 5 right biceps and deltoid, 0 out of 5 elbow flexion, wrist extension, hand intrinsics.  Left upper extremity is 4 out of 5 left deltoid and biceps, 2 out of  5 triceps, 2+ out of 5 finger flexors and wrist extensors as well as hand intrinsics Lower extremities 3+ to 4- out of 5 proximal distal. Psychiatric: Patient pleasant and appropriate  Assessment/Plan: 1.  Functional and mobility deficits secondary to cervical central cord syndrome which require 3+ hours per day of interdisciplinary therapy in a comprehensive inpatient rehab setting. Physiatrist is providing close team supervision and 24 hour management of active medical problems listed below. Physiatrist and rehab team continue to assess barriers to discharge/monitor patient progress toward functional and medical goals.  Function:  Bathing Bathing position      Bathing parts      Bathing assist        Upper Body Dressing/Undressing Upper body dressing                    Upper body assist        Lower Body Dressing/Undressing Lower body dressing                                  Lower body assist        Toileting Toileting          Toileting assist     Transfers Chair/bed transfer  Locomotion Ambulation           Wheelchair          Cognition Comprehension Comprehension assist level: Follows complex conversation/direction with no assist  Expression Expression assist level: Expresses complex ideas: With no assist  Social Interaction Social Interaction assist level: Interacts appropriately with others - No medications needed.  Problem Solving Problem solving assist level: Solves complex problems: With extra time  Memory Memory assist level: More than reasonable amount of time(pt reports memory deficits at baseline)  Medical Problem List and Plan: 1. Tetraparesis, secondary to Central cord syndrome  -Beginning therapies today 2. DVT Prophylaxis/Anticoagulation: Pharmaceutical:Lovenox 3. Pain Management:Oxycodone prn. Continue Neurontin at bedtime 4. Mood:LCSW to follow for evaluation and support. 5. Neuropsych:  This patientiscapable of making decisions on hisown behalf. 6. Skin/Wound Care:routine pressure relief measures. 7. Fluids/Electrolytes/Nutrition:Encourage p.o. Intake.  -I personally reviewed the patient's labs today.  . 8. HTN: Poorly controlled at baseline with SBP up to 200's. Monitor BID. Continue norvasc, coreg bid and clonidine  -Reasonable control at present 9. Right ear infection?: continue ciprodex bid.  10. Constipation: continue Miralax daily and  suppository daily.  Patient with bowel movement this morning 11. PTSD with anxiety/depression/nightmares: Managed with Paxil for mood stabilization and trazodone/melatonin for sleep.  12. Emphysema: On albuterol prn. 13. ABLA: Continue to monitor--most recent H/H--8.8/27.2 prior to today's: 8.1/25.6  -Continue to follow for further trend  -Iron supplementation  -Check stool guaiac if further drop 14. Hyponatremia: Improving from 127-->133-->134 6/21   LOS (Days) 1 A FACE TO FACE EVALUATION WAS PERFORMED  Meredith Staggers, MD 06/08/2018 11:41 AM

## 2018-06-08 NOTE — Progress Notes (Signed)
Patient information reviewed and entered into eRehab system by Larrie Lucia, RN, CRRN, PPS Coordinator.  Information including medical coding and functional independence measure will be reviewed and updated through discharge.     Per nursing patient was given "Data Collection Information Summary for Patients in Inpatient Rehabilitation Facilities with attached "Privacy Act Statement-Health Care Records" upon admission.  

## 2018-06-08 NOTE — Evaluation (Signed)
Occupational Therapy Assessment and Plan  Patient Details  Name: Gregory Hurley MRN: [id number] Date of Birth: 06-03-1946  OT Diagnosis: abnormal posture, muscle weakness (generalized) and quadriparesis at level C6/7 Rehab Potential: Rehab Potential (ACUTE ONLY): Good ELOS: 18-21   Today's Date: 06/08/2018 OT Individual Time: 1660-6301 OT Individual Time Calculation (min): 60 min     And [phone] 57 min  Problem List:  Patient Active Problem List   Diagnosis Date Noted  . Central cord syndrome ([place]) 06/07/2018  . Syncope 07/07/2017  . HTN (hypertension) 07/07/2017  . Depression 07/07/2017  . CKD (chronic kidney disease), stage III ([place]) 07/07/2017  . Hyponatremia 07/07/2017  . Hypokalemia 07/07/2017    Past Medical History:  Past Medical History:  Diagnosis Date  . Anemia   . CKD (chronic kidney disease), stage III ([place])   . Elevated alkaline phosphatase level   . History of fracture of clavicle   . History of fracture of rib   . Hypertension   . Pancytopenia ([place])   . PTSD (post-traumatic stress disorder)    FOLLOWED AT [facility]  . Thrombocytopenia ([place])    Past Surgical History:  Past Surgical History:  Procedure Laterality Date  . NO PAST SURGERIES      Assessment & Plan Clinical Impression: 72 year old male with history of CKD, HTN, PTSD who was admitted to [facility] on 05/31/18 after fall with numbness and tingling BUE/BLE as well as weakness BUE/RLE>LLE  due to multiple cervical fractures. He reported feeling lightheaded followed by unresponsive episode and was down about 2 hours prior to calling EMS. UDS positive for cocaine, opiates and THC.  Work up revealed ankylosed spine with C6/C7 fracture with instability, cord contusion C5-C6, diffuse epidural hematoma from foramen magnum into thoracic spinal canal, right gluteus maximus hematoma and severe centrilobular emphysema. He was taken to OR for C3-T1 PSF with decompression by Dr. Binnie Rail. Hospital course  significant for acute hypoxic respiratory failure with hypercarbia, hyponatremia, ABLA and right ear pain treated with ciprodex.  Patient with incomplete quadriparesis as well as problems with processing affecting functional status and CIR recommended for follow up therapy  Patient currently requires max-total with basic self-care skills and IADL secondary to muscle weakness, decreased cardiorespiratoy endurance, impaired timing and sequencing, unbalanced muscle activation and decreased coordination, decreased visual acuity and decreased sitting balance, decreased standing balance, decreased postural control and decreased balance strategies.  Prior to hospitalization, patient could complete BADL/IADL with independent .  Patient will benefit from skilled intervention to decrease level of assist with basic self-care skills and increase independence with basic self-care skills prior to discharge home with care partner.  Anticipate patient will require 24 hour supervision and follow up home health.  OT - End of Session Activity Tolerance: Tolerates 30+ min activity with multiple rests Endurance Deficit: Yes OT Assessment Rehab Potential (ACUTE ONLY): Good OT Patient demonstrates impairments in the following area(s): Balance;Endurance;Motor;Safety;Sensory OT Basic ADL's Functional Problem(s): Eating;Grooming;Bathing;Dressing;Toileting OT Transfers Functional Problem(s): Toilet;Tub/Shower OT Additional Impairment(s): Fuctional Use of Upper Extremity OT Plan OT Intensity: Minimum of 1-2 x/day, 45 to 90 minutes OT Frequency: 5 out of 7 days OT Duration/Estimated Length of Stay: 18-21 OT Treatment/Interventions: Balance/vestibular training;Discharge planning;Functional electrical stimulation;Pain management;Self Care/advanced ADL retraining;Therapeutic Activities;UE/LE Coordination activities;Disease mangement/prevention;Functional mobility training;Patient/family education;Skin care/wound  managment;Therapeutic Exercise;Visual/perceptual remediation/compensation;Community reintegration;DME/adaptive equipment instruction;Neuromuscular re-education;Psychosocial support;Splinting/orthotics;UE/LE Strength taining/ROM;Wheelchair propulsion/positioning OT Self Feeding Anticipated Outcome(s): MOD I OT Basic Self-Care Anticipated Outcome(s): S OT Toileting Anticipated Outcome(s): S OT Bathroom Transfers Anticipated Outcome(s): S OT  Recommendation Recommendations for Other Services: Therapeutic Recreation consult Therapeutic Recreation Interventions: Pet therapy Patient destination: Home Follow Up Recommendations: Home health OT Equipment Recommended: 3 in 1 bedside comode;Tub/shower bench   Skilled Therapeutic Intervention 1:1. Pt educated on role/purpose of OT, CIR, ELOS, and pt centered goal setting. Pt completes rolling B to place bed pan for urgency. Pt able to have BM while OT selects w/c and cushion. Pt completes rolling with min A to complete posterior hygiene as well as don brief with Vc for use of bed rails. Pt completes stand pivot transfer with no AD and VC for handplacement and MOD A for lifting. Pt completes oral care with red foam handle with LUE. Pt able to bring RU to mouth to wipe with washcloth. Pt completes toilet transfer using grab bar and MOD A for standing w/c<>BSC. Exited session with pt seated in w/c, call ight in reach and chair alarm on.   Session 2: Pt seated in w/c upon arrival. Pt completes UB bathing seated in w/c with HOH A to facilitate grasp of washcloth in RUE to wash L arm. Pt able to wash chest, abdomen and RUE. OT washes back. Pt declines LB bathing at this time. Pt educated on threading RLE into sleeve of shirt first d/t decreased movement. Pt able to thread most of sleeve, however long sleeve shirt has a tight end at wrist and pt does not have enough grip to pull over hand. Overall total A for dressing LB and UB. Pt sit to stand at sink with heavy MOD A  to power up from w/c as OT advances pants past hips. Pt requires cueing for hand placement. OT reviews RW use and transfer onto TTB. Pt returns demonstration with overall MOD A, however pt would benefit from continued practice of RW as pt tends to push walker far in front of him during ttransfers. Exited session with pt seated in bed, sidlying with call light, exit alarm on in reach and all needs met.   OT Evaluation Precautions/Restrictions  Precautions Precautions: Fall;Other (comment)(cervical precautiuons) Restrictions Weight Bearing Restrictions: No General Chart Reviewed: Yes Vital Signs Therapy Vitals Temp: 98.9 F (37.2 C) Temp Source: Oral Pulse Rate: 67 Resp: 18 BP: (!) 141/50 Patient Position (if appropriate): Lying Oxygen Therapy SpO2: 100 % O2 Device: Room Air Pain Pain Assessment Pain Scale: 0-10 Pain Score: 0-No pain Pain Type: Acute pain Pain Location: Neck Pain Orientation: Left;Right Pain Descriptors / Indicators: Aching;Burning Pain Frequency: Intermittent Pain Onset: Gradual Patients Stated Pain Goal: 2 Pain Intervention(s): Medication (See eMAR) Home Living/Prior Functioning Home Living Family/patient expects to be discharged to:: Private residence Living Arrangements: Children Available Help at Discharge: Family, Available 24 hours/day Type of Home: Apartment Home Access: Level entry Home Layout: One level Bathroom Shower/Tub: Chiropodist: Standard Bathroom Accessibility: Yes Additional Comments: Lived in apartment next to his brother.  Lives With: Alone IADL History Homemaking Responsibilities: Yes Meal Prep Responsibility: Primary Laundry Responsibility: Primary Cleaning Responsibility: Primary Bill Paying/Finance Responsibility: Primary Shopping Responsibility: Primary Child Care Responsibility: Primary Mode of Transportation: Family Occupation: On disability Type of Occupation: veteran Leisure and Hobbies: sit  outisde  Prior Function Level of Independence: Independent with basic ADLs, Independent with homemaking with ambulation, Independent with gait  Able to Take Stairs?: Yes Driving: No ADL   Vision Baseline Vision/History: Wears glasses Wears Glasses: At all times Patient Visual Report: No change from baseline(report needing to go to [place] to get new prescription glasses) Vision Assessment?: Yes Eye Alignment: Within  Functional Limits Ocular Range of Motion: Within Functional Limits Alignment/Gaze Preference: Within Defined Limits Tracking/Visual Pursuits: Able to track stimulus in all quads without difficulty Saccades: Within functional limits Convergence: Within functional limits Visual Fields: No apparent deficits Perception  Perception: Within Functional Limits Praxis Praxis: Intact Cognition Orientation Level: Person;Place;Situation Person: Oriented Place: Oriented Situation: Oriented Year: 2019 Month: June Day of Week: Correct Memory: Appears intact Immediate Memory Recall: Sock;Blue;Bed Memory Recall: Sock;Blue;Bed Memory Recall Sock: Without Cue Memory Recall Blue: Without Cue Memory Recall Bed: Without Cue Sensation Sensation Light Touch: Appears Intact Stereognosis: Impaired by gross assessment Coordination Gross Motor Movements are Fluid and Coordinated: No Fine Motor Movements are Fluid and Coordinated: No Coordination and Movement Description: able to oppose/gross grasp in LUE, trace flexion in R hand/elbow Motor  Motor Motor: Other (comment)(quadraparesis) Mobility    sit to stand MOD A Stand pivot transfer MOD A Trunk/Postural Assessment  Cervical Assessment Cervical Assessment: Exceptions to WFL(cervical precautions/soft collar) Thoracic Assessment Thoracic Assessment: Within Functional Limits Lumbar Assessment Lumbar Assessment: Within Functional Limits Postural Control Postural Control: Deficits on evaluation(delayed)  Balance Balance Balance  Assessed: Yes Static Standing Balance Static Standing - Level of Assistance: 4: Min assist Extremity/Trunk Assessment RUE Assessment RUE Assessment: Exceptions to [facility] Active Range of Motion (AROM) Comments: trace shoulder/digit flexion, elbow flexion 70*, pronation/supination 3/5 LUE Assessment LUE Assessment: Exceptions to [facility] Passive Range of Motion (PROM) Comments: (grossly 3/5 for shouler/elbow/fingers)   See Function Navigator for Current Functional Status.   Refer to Care Plan for Long Term Goals  Recommendations for other services: Therapeutic Recreation  Pet therapy   Discharge Criteria: Patient will be discharged from OT if patient refuses treatment 3 consecutive times without medical reason, if treatment goals not met, if there is a change in medical status, if patient makes no progress towards goals or if patient is discharged from hospital.  The above assessment, treatment plan, treatment alternatives and goals were discussed and mutually agreed upon: by patient  Tonny Branch 06/08/2018, 8:30 AM

## 2018-06-08 NOTE — Evaluation (Signed)
Physical Therapy Assessment and Plan  Patient Details  Name: Gregory Hurley MRN: [id number] Date of Birth: 05-12-1946  PT Diagnosis: Ataxic gait, Coordination disorder, Muscle weakness and Quadriplegia Rehab Potential: Good ELOS: 18-21   Today's Date: 06/08/2018 PT Individual Time: 3354-5625 PT Individual Time Calculation (min): 75 min    Problem List:  Patient Active Problem List   Diagnosis Date Noted  . Central cord syndrome ([place]) 06/07/2018  . Syncope 07/07/2017  . HTN (hypertension) 07/07/2017  . Depression 07/07/2017  . CKD (chronic kidney disease), stage III ([place]) 07/07/2017  . Hyponatremia 07/07/2017  . Hypokalemia 07/07/2017    Past Medical History:  Past Medical History:  Diagnosis Date  . Anemia   . CKD (chronic kidney disease), stage III ([place])   . Elevated alkaline phosphatase level   . History of fracture of clavicle   . History of fracture of rib   . Hypertension   . Pancytopenia ([place])   . PTSD (post-traumatic stress disorder)    FOLLOWED AT [facility]  . Thrombocytopenia ([place])    Past Surgical History:  Past Surgical History:  Procedure Laterality Date  . NO PAST SURGERIES      Assessment & Plan Clinical Impression: A 72 yo male brought to [facility] ED and then transferred to [facility] ED on 05/31/18.  Patient suffered a ground level fall following "lightheadedness" and then + LOC.  Found to have C6 and C7 fractures with central cord syndrome at C4 level.  He had bilateral upper and lower extremity motor weakness and sensory changes.  On 06/01/18 patient underwent urgent surgical intervention with C3-T1 posterior fusion by Dr. Macario Carls.  Echo was negative during syncopal workup. PT/OT/SLP evaluations were completed with recommendations for acute inpatient rehab admission.  Patient transferred to CIR on 06/07/2018 .   Patient currently requires mod with mobility secondary to muscle weakness and muscle paralysis, abnormal tone, ataxia and decreased coordination and  decreased sitting balance, decreased standing balance, decreased postural control, decreased balance strategies and tetraplegia.  Prior to hospitalization, patient was independent  with mobility and lived with Alone in a House(will d/c to daughter's home) home.  Home access is  Level entry.  Patient will benefit from skilled PT intervention to maximize safe functional mobility, minimize fall risk and decrease caregiver burden for planned discharge home with 24 hour supervision.  Anticipate patient will benefit from follow up [place] at discharge.  PT - End of Session Activity Tolerance: Tolerates 30+ min activity with multiple rests Endurance Deficit: Yes PT Assessment Rehab Potential (ACUTE/IP ONLY): Good PT Barriers to Discharge: Home environment access/layout;Other (comments) PT Barriers to Discharge Comments: new tetraplegia PT Patient demonstrates impairments in the following area(s): Balance;Endurance;Motor PT Transfers Functional Problem(s): Bed Mobility;Bed to Chair;Car;Furniture PT Locomotion Functional Problem(s): Stairs;Wheelchair Mobility;Ambulation PT Plan PT Intensity: Minimum of 1-2 x/day ,45 to 90 minutes PT Frequency: 5 out of 7 days PT Duration Estimated Length of Stay: 18-21 PT Treatment/Interventions: Ambulation/gait training;Community reintegration;DME/adaptive equipment instruction;Neuromuscular re-education;Psychosocial support;Stair training;UE/LE Strength taining/ROM;Wheelchair propulsion/positioning;UE/LE Coordination activities;Therapeutic Activities;Functional electrical stimulation;Discharge planning;Balance/vestibular training;Disease management/prevention;Functional mobility training;Patient/family education;Splinting/orthotics;Therapeutic Exercise;Visual/perceptual remediation/compensation PT Transfers Anticipated Outcome(s): supervision with LRAD PT Locomotion Anticipated Outcome(s): supervision ambulatory with LRAD, w/c for community PT Recommendation Follow Up  Recommendations: Home health PT;24 hour supervision/assistance Patient destination: Home Equipment Recommended: To be determined  Skilled Therapeutic Intervention Pain as described below.  Session focus on initial PT evaluation, and pt education in rehab process, goals of therapy, ELOS, and plan of care.  Pt currently performing transfers with mod  assist to power up from chair for stand/pivot, gait with +2 HHA and mod assist with scissoring and occasional R lean, and unable to propel standard w/c due to UE weakness.  Provided pt with thicker foam w/c cushion for increased seat to floor height for improved independence with transfers, and applied theraband to drive wheels for improved propulsion as UEs continue to improve.  Pt returned to room at end of session and positioned upright in w/c with chair alarm activated, call bell in reach and needs met.   PT Evaluation Precautions/Restrictions Precautions Precautions: Fall;Other (comment) Precaution Comments: per previous therapy documention cervical precautions Restrictions Weight Bearing Restrictions: No Pain Pain Assessment Pain Scale: 0-10 Pain Score: 4  Pain Location: Generalized(R UE, hips) Pain Descriptors / Indicators: Burning Pain Intervention(s): RN made aware;Emotional support Home Living/Prior Functioning Home Living Available Help at Discharge: Family;Available 24 hours/day Type of Home: House(will d/c to daughter's home) Home Access: Level entry Home Layout: Two level;Bed/bath upstairs(per pt will live on main level of daughter's home) Bathroom Accessibility: (unsure)  Lives With: Alone Prior Function Level of Independence: Independent with gait;Independent with transfers  Able to Take Stairs?: Yes Driving: No 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 Praxis: Intact  Cognition Overall Cognitive Status: Within Functional Limits for tasks assessed Arousal/Alertness:  Awake/alert Orientation Level: Oriented X4 Memory: Appears intact Awareness: Appears intact Safety/Judgment: Appears intact Sensation Sensation Light Touch: Appears Intact(LEs) Coordination Gross Motor Movements are Fluid and Coordinated: No Fine Motor Movements are Fluid and Coordinated: No Heel Shin Test: impaired R>L (reports due to weakness on R and flexibility on L) Motor  Motor Motor: Tetraplegia Motor - Skilled Clinical Observations: tetraplegia with UEs>LE, and R side >L side for UEs and LEs  Mobility Bed Mobility Bed Mobility: Rolling Left;Sit to Sidelying Left Rolling Left: Supervision/Verbal cueing Sit to Sidelying Left: Minimal Assistance - Patient > 75% Transfers Transfers: Sit to Stand;Stand to Sit;Stand Pivot Transfers Sit to Stand: Moderate Assistance - Patient 50-74% Stand to Sit: Moderate Assistance - Patient 50-74% Stand Pivot Transfers: Moderate Assistance - Patient 50 - 74% Stand Pivot Transfer Details: Manual facilitation for weight shifting;Verbal cues for technique;Verbal cues for sequencing Transfer (Assistive device): None Locomotion  Gait Ambulation: Yes Gait Assistance: 2 Helpers(2 person min HHA) Gait Distance (Feet): 100 Feet Assistive device: 2 person hand held assist Gait Assistance Details: Tactile cues for posture;Verbal cues for gait pattern;Verbal cues for technique;Verbal cues for sequencing Gait Gait: Yes Gait Pattern: Ataxic;Narrow base of support;Scissoring Stairs / Additional Locomotion Stairs: No  Trunk/Postural Assessment  Cervical Assessment Cervical Assessment: (cervical collar) Thoracic Assessment Thoracic Assessment: Within Functional Limits Lumbar Assessment Lumbar Assessment: Within Functional Limits Postural Control Postural Control: Deficits on evaluation Trunk Control: impaired  Righting Reactions: delayed  Balance Balance Balance Assessed: Yes Static Sitting Balance Static Sitting - Balance Support: No upper  extremity supported Static Sitting - Level of Assistance: 5: Stand by assistance Static Standing Balance Static Standing - Balance Support: Bilateral upper extremity supported;During functional activity Static Standing - Level of Assistance: 4: Min assist Extremity Assessment      RLE Assessment Passive Range of Motion (PROM) Comments: WFL Active Range of Motion (AROM) Comments: WFL RLE Strength Right Hip Flexion: 3/5 Right Knee Flexion: 4/5 Right Knee Extension: 4/5 Right Ankle Dorsiflexion: 3+/5 Right Ankle Plantar Flexion: 3+/5 LLE Assessment Passive Range of Motion (PROM) Comments: WFL Active Range of Motion (AROM) Comments: WFL LLE Strength Left Hip Flexion: 3+/5 Left Knee Flexion: 4/5 Left Knee Extension: 4/5  Left Ankle Dorsiflexion: 4/5 Left Ankle Plantar Flexion: 4/5   See Function Navigator for Current Functional Status.   Refer to Care Plan for Long Term Goals  Recommendations for other services: None   Discharge Criteria: Patient will be discharged from PT if patient refuses treatment 3 consecutive times without medical reason, if treatment goals not met, if there is a change in medical status, if patient makes no progress towards goals or if patient is discharged from hospital.  The above assessment, treatment plan, treatment alternatives and goals were discussed and mutually agreed upon: by patient  Michel Santee 06/08/2018, 3:51 PM

## 2018-06-08 NOTE — Progress Notes (Signed)
Social Work Social Work Assessment and Plan  Patient Details  Name: Gregory Hurley MRN: [id number] Date of Birth: [birth year]  Today's Date: 06/08/2018  Problem List:  Patient Active Problem List   Diagnosis Date Noted  . Central cord syndrome ([place]) 06/07/2018  . Syncope 07/07/2017  . HTN (hypertension) 07/07/2017  . Depression 07/07/2017  . CKD (chronic kidney disease), stage III ([place]) 07/07/2017  . Hyponatremia 07/07/2017  . Hypokalemia 07/07/2017   Past Medical History:  Past Medical History:  Diagnosis Date  . Anemia   . CKD (chronic kidney disease), stage III ([place])   . Elevated alkaline phosphatase level   . History of fracture of clavicle   . History of fracture of rib   . Hypertension   . Pancytopenia ([place])   . PTSD (post-traumatic stress disorder)    FOLLOWED AT [facility]  . Thrombocytopenia ([place])    Past Surgical History:  Past Surgical History:  Procedure Laterality Date  . NO PAST SURGERIES     Social History:  reports that he has been smoking cigars.  He has a 50.00 pack-year smoking history. He has never used smokeless tobacco. He reports that he drinks alcohol. He reports that he has current or past drug history.  Family / Support Systems Marital Status: Divorced Patient Roles: Parent, Other (Comment)(3 daughter, brother and mother) Children: daughter, Gregory Hurley @ (C) [phone] and two other daughters living in [place] Other Supports: brother and 3 yo mother are also local. Anticipated Caregiver: Daughter Ability/Limitations of Caregiver: Daughter works from her home and can assist/provide supervision after discharge Caregiver Availability: 24/7 Family Dynamics: Pt speaks very affectionately about his 3 daughters and other family members.  He denies any concerns about level of assistance he will have at d/c.    Social History Preferred language: English Religion: None Cultural Background: NA Read: Yes Write: Yes Employment Status:  Retired Freight forwarder Issues: None Guardian/Conservator: None - per MD, pt is capable of making decisions on his own behalf.   Abuse/Neglect Abuse/Neglect Assessment Can Be Completed: Yes Physical Abuse: Denies Verbal Abuse: Denies Sexual Abuse: Denies Exploitation of patient/patient's resources: Denies Self-Neglect: Denies  Emotional Status Pt's affect, behavior adn adjustment status: Pt very pleasant and smiling throughout interview.  He laughs easily with me as we talk about current politics.  He admits to frustration with his fall/ injury and resulting limitations, however, he describes himself as "optimistic" about longer term recovery based on current progress.  Daughter notes that pt "has always been upbeat".  Will monitor throughout stay and refer for neuropsychology support if indicated. Recent Psychosocial Issues: None Pyschiatric History: Pt reports that he is followed at the [facility] for PTSD since his time of service in [place] Substance Abuse History: None  Patient / Family Perceptions, Expectations & Goals Pt/Family understanding of illness & functional limitations: Pt and daughter with good, basic understanding of his injury and resulting limitations.  Aware of anticipated assistance needs upon d/c.  Very open to education from team. Premorbid pt/family roles/activities: Pt was independent overall but no longer drives. Anticipated changes in roles/activities/participation: Per goals of supervision, daughter to assume primary caregiver role. Pt/family expectations/goals: "I just hope I can start seeing some stuff happen in my right hand."  [employer]: None Premorbid Home Care/DME Agencies: None Transportation available at discharge: yes Resource referrals recommended: Neuropsychology  Discharge Planning Living Arrangements: Alone Support Systems: Children, Parent, Other relatives, Friends/neighbors Type of Residence: Private  residence Insurance Resources: Medicare([employer] Medicare) Financial Resources:  Social Security Financial Screen Referred: No Living Expenses: Own Money Management: Patient Does the patient have any problems obtaining your medications?: No Home Management: pt Patient/Family Preliminary Plans: Pt plans to d/c home with daughter, Gregory Hurley, who is able to provide needed support. Social Work Anticipated Follow Up Needs: HH/OP Expected length of stay: 18-21 days  Clinical Impression Very pleasant gentleman here following a fall at home and suffering a cervical SCI.  Showing functional improvements and pt/ family optimistic overall.  Daughter, Gregory Hurley, very supportive and pt to d/c home with her where 24/7 support can be provided.  Pt denies any significant emotional distress, however, will monitor and refer for neuropsychology as needed.  Jenica Costilow 06/08/2018, 3:56 PM

## 2018-06-08 NOTE — IPOC Note (Signed)
Overall Plan of Care [facility]) Patient Details Name: Gregory Hurley MRN: [id number] DOB: [birth year]  Admitting Diagnosis: <principal problem not specified>cervical central cord  Hospital Problems: Active Problems:   Central cord syndrome [facility])     Functional Problem List: Nursing Bladder, Bowel, Edema, Endurance, Medication Management, Motor, Behavior, Perception, Safety, Sensory  PT Balance, Endurance, Motor  OT Balance, Endurance, Motor, Safety, Sensory  SLP    TR         Basic ADL's: OT Eating, Grooming, Bathing, Dressing, Toileting     Advanced  ADL's: OT       Transfers: PT Bed Mobility, Bed to Chair, Car, Manufacturing systems engineer, Metallurgist: PT Stairs, Emergency planning/management officer, Ambulation     Additional Impairments: OT Fuctional Use of Upper Extremity  SLP None      TR      Anticipated Outcomes Item Anticipated Outcome  Self Feeding MOD I  Swallowing      Basic self-care  S  Toileting  S   Bathroom Transfers S  Bowel/Bladder  min assist   Transfers  supervision with LRAD  Locomotion  supervision ambulatory with LRAD, w/c for community  Communication     Cognition     Pain  zero pain  Safety/Judgment  mod I   Therapy Plan: PT Intensity: Minimum of 1-2 x/day ,45 to 90 minutes PT Frequency: 5 out of 7 days PT Duration Estimated Length of Stay: 18-21 OT Intensity: Minimum of 1-2 x/day, 45 to 90 minutes OT Frequency: 5 out of 7 days OT Duration/Estimated Length of Stay: 18-21      Team Interventions: Nursing Interventions Patient/Family Education, Bowel Management, Skin Care/Wound Management, Bladder Management, Disease Management/Prevention, Medication Management, Cognitive Remediation/Compensation, Discharge Planning  PT interventions Ambulation/gait training, Community reintegration, DME/adaptive equipment instruction, Neuromuscular re-education, Psychosocial support, Stair training, UE/LE Strength taining/ROM, Wheelchair  propulsion/positioning, UE/LE Coordination activities, Therapeutic Activities, Functional electrical stimulation, Discharge planning, Training and development officer, Disease management/prevention, Functional mobility training, Patient/family education, Splinting/orthotics, Therapeutic Exercise, Visual/perceptual remediation/compensation  OT Interventions Balance/vestibular training, Discharge planning, Functional electrical stimulation, Pain management, Self Care/advanced ADL retraining, Therapeutic Activities, UE/LE Coordination activities, Disease mangement/prevention, Functional mobility training, Patient/family education, Skin care/wound managment, Therapeutic Exercise, Visual/perceptual remediation/compensation, Academic librarian, Engineer, drilling, Neuromuscular re-education, Psychosocial support, Splinting/orthotics, UE/LE Strength taining/ROM, Wheelchair propulsion/positioning  SLP Interventions    TR Interventions    SW/CM Interventions Discharge Planning, Psychosocial Support, Patient/Family Education   Barriers to Discharge MD  Medical stability  Nursing Other (comments)    PT Home environment access/layout, Other (comments) new tetraplegia  OT      SLP      SW       Team Discharge Planning: Destination: PT-Home ,OT- Home , SLP-Home Projected Follow-up: PT-Home health PT, 24 hour supervision/assistance, OT-  Home health OT, SLP-None Projected Equipment Needs: PT-To be determined, OT- 3 in 1 bedside comode, Tub/shower bench, SLP-  Equipment Details: PT- , OT-  Patient/family involved in discharge planning: PT- Patient,  OT-Patient, SLP-Patient  MD ELOS: 18-20 days Medical Rehab Prognosis:  Excellent Assessment: The patient has been admitted for CIR therapies with the diagnosis of cervical central cord. The team will be addressing functional mobility, strength, stamina, balance, safety, adaptive techniques and equipment, self-care, bowel and bladder mgt,  patient and caregiver education, orthotics, community reentry, pain control, ego support. Goals have been set at supervision with mobility and self-care.    Meredith Staggers, MD, [facility]      See Team Conference Notes for weekly  updates to the plan of care

## 2018-06-08 NOTE — Progress Notes (Signed)
Orthopedic Tech Progress Note Patient Details:  Gregory Hurley [birth year] [id number]  Patient ID: Gregory Hurley, male   DOB: 07/26/1946, 72 y.o.   MRN: [id number]   Gregory Hurley 06/08/2018, 12:12 PMCalled [facility] for Bilateral Pra[employer] boots

## 2018-06-08 NOTE — Progress Notes (Signed)
LE venous duplex prelim: negative for DVT. Darlyn Repsher Eunice, RDMS, RVT  

## 2018-06-09 ENCOUNTER — Inpatient Hospital Stay (HOSPITAL_COMMUNITY): Payer: PPO

## 2018-06-09 DIAGNOSIS — G8252 Quadriplegia, C1-C4 incomplete: Secondary | ICD-10-CM

## 2018-06-09 DIAGNOSIS — G8254 Quadriplegia, C5-C7 incomplete: Secondary | ICD-10-CM

## 2018-06-09 DIAGNOSIS — S14129D Central cord syndrome at unspecified level of cervical spinal cord, subsequent encounter: Secondary | ICD-10-CM

## 2018-06-09 LAB — GLUCOSE, CAPILLARY: GLUCOSE-CAPILLARY: 106 mg/dL — AB (ref 65–99)

## 2018-06-09 LAB — HEMOGLOBIN AND HEMATOCRIT, BLOOD
HCT: 23.6 % — ABNORMAL LOW (ref 39.0–52.0)
Hemoglobin: 7.5 g/dL — ABNORMAL LOW (ref 13.0–17.0)

## 2018-06-09 NOTE — Progress Notes (Signed)
Occupational Therapy Session Note  Patient Details  Name: Gregory Hurley MRN: [id number] Date of Birth: 01-28-1946  Today's Date: 06/09/2018 OT Individual Time: 0800-0901 OT Individual Time Calculation (min): 61 min    Short Term Goals: Week 1:  OT Short Term Goal 1 (Week 1): Pt will wash LUE with RUE with MIN A to demonstrate improved coordination/grasp of R hand OT Short Term Goal 2 (Week 1): Pt will complete BSC/toilet transfer wiht MIN A OT Short Term Goal 3 (Week 1): Pt will advance pants past hips wiht min A OT Short Term Goal 4 (Week 1): Pt will groom in standing at sink for 1 grooming items and MIN A for standing balance OT Short Term Goal 5 (Week 1): Pt will thread 1UE into sleeve of shirt with supervision  Skilled Therapeutic Interventions/Progress Updates:    Pt received supine with RN present requesting to use the bathroom. No c/o pain throughout session. Pt completed squat pivot transfer bed>w/c with mod lifting A and vc for LE placement and sequencing. Pt attempted to stand x3 from w/c level with max A. Instruction re body mechanics and head/hip relationship provided. Pt able to stand pivot to [facility] with mod A overall.  Pt unable to fully void bowels despite increased time provided. Pt completed UB bathing with mod A. Vc/manual facilitation provided for visual attention to L UE during bathing to facilitate increased grasp on washcloth. Pt required frequent vc for attention to task and thoroughness during bathing. Max A required to don pants sitting on BSC. Session was concluded with retrograde massage of the R hand d/t observation of significant edema. Pt reported no pain before, during, or after massage and stated that "that felt good!" Instruction was provided re elevating hand and muscle pumping to further reduce edema throughout the day. Pt was left sitting up in w/c with chair alarm set and QRB donned.    Therapy Documentation Precautions:  Precautions Precautions: Fall, Other  (comment) Precaution Comments: per previous therapy documention cervical precautions Restrictions Weight Bearing Restrictions: No  Pain: Pain Assessment Pain Scale: 0-10 Pain Score: 0-No pain  See Function Navigator for Current Functional Status.   Therapy/Group: Individual Therapy  Curtis Sites 06/09/2018, 10:00 AM

## 2018-06-09 NOTE — Progress Notes (Signed)
[place] PHYSICAL MEDICINE & REHABILITATION     PROGRESS NOTE    Subjective/Complaints: No upper extremity or lower extremity pain, no neck pain.  ROS: Patient denies fever, rash, sore throat, blurred vision, nausea, vomiting, diarrhea, cough, shortness of breath or chest pain, joint or back pain, headache, or mood change.   Objective:  No results found. Recent Labs    06/08/18 0645 06/09/18 0724  WBC 5.2  --   HGB 8.1* 7.5*  HCT 25.6* 23.6*  PLT 190  --    Recent Labs    06/08/18 0645  NA 134*  K 4.3  CL 102  GLUCOSE 98  BUN 20  CREATININE 1.20  CALCIUM 8.1*   CBG (last 3)  Recent Labs    06/08/18 0706 06/08/18 2142 06/09/18 0633  GLUCAP 96 153* 106*    Wt Readings from Last 3 Encounters:  06/07/18 75.8 kg (167 lb)  05/31/18 77.1 kg (170 lb)  07/07/17 77.1 kg (170 lb)     Intake/Output Summary (Last 24 hours) at 06/09/2018 1307 Last data filed at 06/09/2018 1200 Gross per 24 hour  Intake 1653 ml  Output 675 ml  Net 978 ml    Vital Signs: Blood pressure (!) 111/43, pulse (!) 56, temperature 99 F (37.2 C), temperature source Oral, resp. rate 17, SpO2 100 %. Physical Exam:  Constitutional: No distress . Vital signs reviewed. HEENT: EOMI, oral membranes moist, large cyst on head Neck: Posterior neck incision clean and intact with staples.  Patient wearing soft cervical collar Cardiovascular: RRR without murmur. No JVD    Respiratory: CTA Bilaterally without wheezes or rales. Normal effort    GI: BS +, non-tender, non-distended  Musculoskeletal: He exhibitssome sensitivity to touch in his arms and legs as well as trunk  Neurologic Decreased light touch and pain sensation in both arms and legs (1/2). Motor right upper extremity is 4 out of 5 right biceps and deltoid, 0 out of 5 elbow flexion, wrist extension, hand intrinsics.  Left upper extremity is 4 out of 5 left deltoid and biceps, 2 out of 5 triceps, 2+ out of 5 finger flexors and wrist  extensors as well as hand intrinsics Lower extremities 3+ to 4- out of 5 proximal distal. Psychiatric: Patient pleasant and appropriate  Assessment/Plan: 1.  Functional and mobility deficits secondary to cervical central cord syndrome which require 3+ hours per day of interdisciplinary therapy in a comprehensive inpatient rehab setting. Physiatrist is providing close team supervision and 24 hour management of active medical problems listed below. Physiatrist and rehab team continue to assess barriers to discharge/monitor patient progress toward functional and medical goals.  Function:  Bathing Bathing position   Position: Other (comment)(Sitting on BSC )  Bathing parts Body parts bathed by patient: Right arm, Chest, Abdomen, Front perineal area, Right upper leg, Left upper leg Body parts bathed by helper: Left arm, Back, Buttocks, Right lower leg, Left lower leg  Bathing assist Assist Level: Touching or steadying assistance(Pt > 75%)      Upper Body Dressing/Undressing Upper body dressing   What is the patient wearing?: Pull over shirt/dress     Pull over shirt/dress - Perfomed by patient: Thread/unthread left sleeve, Pull shirt over trunk Pull over shirt/dress - Perfomed by helper: Thread/unthread right sleeve, Put head through opening        Upper body assist Assist Level: Touching or steadying assistance(Pt > 75%)      Lower Body Dressing/Undressing Lower body dressing   What is the  patient wearing?: Pants, Non-skid slipper socks       Pants- Performed by helper: Thread/unthread right pants leg, Thread/unthread left pants leg, Pull pants up/down   Non-skid slipper socks- Performed by helper: Don/doff right sock, Don/doff left sock                  Lower body assist Assist for lower body dressing: (max A)      Toileting Toileting   Toileting steps completed by patient: Performs perineal hygiene Toileting steps completed by helper: Adjust clothing prior to  toileting, Adjust clothing after toileting    Toileting assist Assist level: (mod A)   Transfers Chair/bed transfer   Chair/bed transfer method: Stand pivot Chair/bed transfer assist level: Moderate assist (Pt 50 - 74%/lift or lower) Chair/bed transfer assistive device: Armrests Mechanical lift: Maximove   Locomotion Ambulation     Max distance: 100' Assist level: 2 helpers   Chief Financial Officer Comprehension Comprehension assist level: Follows complex conversation/direction with no assist  Expression Expression assist level: Expresses complex ideas: With no assist  Social Interaction Social Interaction assist level: Interacts appropriately with others - No medications needed.  Problem Solving Problem solving assist level: Solves complex problems: With extra time  Memory Memory assist level: More than reasonable amount of time  Medical Problem List and Plan: 1. Tetraparesis, secondary to Central cord syndrome  Continue CIR PT OT 2. DVT Prophylaxis/Anticoagulation: Pharmaceutical:Lovenox 3. Pain Management:Oxycodone prn. Continue Neurontin at bedtime 4. Mood:LCSW to follow for evaluation and support. 5. Neuropsych: This patientiscapable of making decisions on hisown behalf. 6. Skin/Wound Care:routine pressure relief measures. 7. Fluids/Electrolytes/Nutrition:Encourage p.o. Intake.  Marland Kitchen  Excellent increased take of fluids 1400+ today 8. HTN: Poorly controlled at baseline with SBP up to 200's. Monitor BID. Continue norvasc, coreg bid and clonidine   Vitals:   06/08/18 1947 06/09/18 0434  BP: (!) 106/55 (!) 111/43  Pulse: 61 (!) 56  Resp: 18 17  Temp: 97.8 F (36.6 C) 99 F (37.2 C)  SpO2: 100% 100%  Controlled 06/09/2018 9. Right ear infection?: continue ciprodex bid.  10. Constipation: continue Miralax daily and  suppository daily.  Patient with bowel movement this morning 11. PTSD with anxiety/depression/nightmares: Managed with Paxil for mood  stabilization and trazodone/melatonin for sleep.  12. Emphysema: On albuterol prn. 13. ABLA: Continue to monitor--most recent H/H--8.8/27.2 prior to today's: 8.1/25.6  -Continue to follow for further trend  -Iron supplementation  -Check stool guaiac if further drop 14. Hyponatremia: Improving from 127-->133-->134 6/21   LOS (Days) 2 A FACE TO FACE EVALUATION WAS PERFORMED  Charlett Blake, MD 06/09/2018 1:07 PM

## 2018-06-10 ENCOUNTER — Inpatient Hospital Stay (HOSPITAL_COMMUNITY): Payer: PPO

## 2018-06-10 ENCOUNTER — Inpatient Hospital Stay (HOSPITAL_COMMUNITY): Payer: PPO | Admitting: Occupational Therapy

## 2018-06-10 LAB — CBC
HCT: 26.6 % — ABNORMAL LOW (ref 39.0–52.0)
Hemoglobin: 8.5 g/dL — ABNORMAL LOW (ref 13.0–17.0)
MCH: 28.1 pg (ref 26.0–34.0)
MCHC: 32 g/dL (ref 30.0–36.0)
MCV: 88.1 fL (ref 78.0–100.0)
PLATELETS: 223 10*3/uL (ref 150–400)
RBC: 3.02 MIL/uL — ABNORMAL LOW (ref 4.22–5.81)
RDW: 15.3 % (ref 11.5–15.5)
WBC: 5.3 10*3/uL (ref 4.0–10.5)

## 2018-06-10 NOTE — Progress Notes (Signed)
Occupational Therapy Session Note  Patient Details  Name: Gregory Hurley MRN: [id number] Date of Birth: 21-Sep-1946  Today's Date: 06/10/2018 OT Individual Time: 8466-5993 OT Individual Time Calculation (min): 71 min   Short Term Goals: Week 1:  OT Short Term Goal 1 (Week 1): Pt will wash LUE with RUE with MIN A to demonstrate improved coordination/grasp of R hand OT Short Term Goal 2 (Week 1): Pt will complete BSC/toilet transfer wiht MIN A OT Short Term Goal 3 (Week 1): Pt will advance pants past hips wiht min A OT Short Term Goal 4 (Week 1): Pt will groom in standing at sink for 1 grooming items and MIN A for standing balance OT Short Term Goal 5 (Week 1): Pt will thread 1UE into sleeve of shirt with supervision  Skilled Therapeutic Interventions/Progress Updates:    Pt greeted in w/c with RN present and providing pain medicine. He reported radiating pain from Rt shoulder>Rt fingertips (5/10), which improved during tx via movement/use. RN reported pt had orthostatic vitals this AM. MD consented to Teds and OT donned them at start of session. Pt asymptomatic. He completed oral care w/c level with HOH for incorporating R UE as gross stabilizer. He used L UE at dominant level with built up toothbrush. Stand pivot<elevated toilet completed with Mod A and cues for technique. Max A toileting tasks due to B UE strength/grasp deficits. Next took pt to dayroom and for remainder of tx, worked on standing balance, posture, and UE NMR via game of large checkers at elevated table. Tactile and verbal cues provided for promoting upright alignment when standing throughout. OT facilitated R UE weightbearing while L UE moved checker pieces. Side-stepping Lt>Rt in front of table completed with steady assist. Longest standing window without rest 2 minutes! Max vcs for hand placement during all sit<stand transitions.  At end of tx pt was returned to room and left with soft call bell and chair alarm set.   Therapy  Documentation Precautions:  Precautions Precautions: Fall, Other (comment) Precaution Comments: per previous therapy documention cervical precautions Restrictions Weight Bearing Restrictions: No Pain: Pain Assessment Pain Score: 0-No pain ADL:    See Function Navigator for Current Functional Status.   Therapy/Group: Individual Therapy  Chandel Zaun A Jazleen Robeck 06/10/2018, 12:29 PM

## 2018-06-10 NOTE — Progress Notes (Signed)
MD notified bp 98/48 sitting; New orders noted

## 2018-06-10 NOTE — Plan of Care (Signed)
  Problem: SCI BOWEL ELIMINATION Goal: RH STG MANAGE BOWEL WITH ASSISTANCE Description STG Manage Bowel with Assistance.min assist  Outcome: Not Progressing; bowel program ; small bm's

## 2018-06-10 NOTE — Progress Notes (Signed)
[place] PHYSICAL MEDICINE & REHABILITATION     PROGRESS NOTE    Subjective/Complaints: Discussed blood pressure with nursing, patient feels okay today no pains.  ROS: Patient denies fever, rash, sore throat, blurred vision, nausea, vomiting, diarrhea, cough, shortness of breath or chest pain, joint or back pain, headache, or mood change.   Objective:  No results found. Recent Labs    06/08/18 0645 06/09/18 0724 06/10/18 0915  WBC 5.2  --  5.3  HGB 8.1* 7.5* 8.5*  HCT 25.6* 23.6* 26.6*  PLT 190  --  223   Recent Labs    06/08/18 0645  NA 134*  K 4.3  CL 102  GLUCOSE 98  BUN 20  CREATININE 1.20  CALCIUM 8.1*   CBG (last 3)  Recent Labs    06/08/18 0706 06/08/18 2142 06/09/18 0633  GLUCAP 96 153* 106*    Wt Readings from Last 3 Encounters:  06/07/18 75.8 kg (167 lb)  05/31/18 77.1 kg (170 lb)  07/07/17 77.1 kg (170 lb)     Intake/Output Summary (Last 24 hours) at 06/10/2018 1130 Last data filed at 06/10/2018 8563 Gross per 24 hour  Intake 800 ml  Output 1900 ml  Net -1100 ml    Vital Signs: Blood pressure (!) 98/47, pulse 61, temperature 98.6 F (37 C), resp. rate 18, SpO2 100 %. Physical Exam:  Constitutional: No distress . Vital signs reviewed. HEENT: EOMI, oral membranes moist, large cyst on head Neck: Posterior neck incision clean and intact with staples.  Patient wearing soft cervical collar Cardiovascular: RRR without murmur. No JVD    Respiratory: CTA Bilaterally without wheezes or rales. Normal effort    GI: BS +, non-tender, non-distended  Musculoskeletal: He exhibitssome sensitivity to touch in his arms and legs as well as trunk  Neurologic Decreased light touch and pain sensation in both arms and legs (1/2). Motor right upper extremity is 4 out of 5 right biceps and deltoid, 0 out of 5 elbow flexion, wrist extension, hand intrinsics.  Left upper extremity is 4 out of 5 left deltoid and biceps, 2 out of 5 triceps, 2+ out of 5 finger  flexors and wrist extensors as well as hand intrinsics Lower extremities 3+ to 4- out of 5 proximal distal. Psychiatric: Patient pleasant and appropriate  Assessment/Plan: 1.  Functional and mobility deficits secondary to cervical central cord syndrome which require 3+ hours per day of interdisciplinary therapy in a comprehensive inpatient rehab setting. Physiatrist is providing close team supervision and 24 hour management of active medical problems listed below. Physiatrist and rehab team continue to assess barriers to discharge/monitor patient progress toward functional and medical goals.  Function:  Bathing Bathing position   Position: Other (comment)(Sitting on BSC )  Bathing parts Body parts bathed by patient: Right arm, Chest, Abdomen, Front perineal area, Right upper leg, Left upper leg Body parts bathed by helper: Left arm, Back, Buttocks, Right lower leg, Left lower leg  Bathing assist Assist Level: Touching or steadying assistance(Pt > 75%)      Upper Body Dressing/Undressing Upper body dressing   What is the patient wearing?: Pull over shirt/dress     Pull over shirt/dress - Perfomed by patient: Thread/unthread left sleeve, Pull shirt over trunk Pull over shirt/dress - Perfomed by helper: Thread/unthread right sleeve, Put head through opening        Upper body assist Assist Level: Touching or steadying assistance(Pt > 75%)      Lower Body Dressing/Undressing Lower body dressing  What is the patient wearing?: Pants, Non-skid slipper socks       Pants- Performed by helper: Thread/unthread right pants leg, Thread/unthread left pants leg, Pull pants up/down   Non-skid slipper socks- Performed by helper: Don/doff right sock, Don/doff left sock                  Lower body assist Assist for lower body dressing: (max A)      Toileting Toileting   Toileting steps completed by patient: Performs perineal hygiene Toileting steps completed by helper: Adjust  clothing prior to toileting, Adjust clothing after toileting    Toileting assist Assist level: (mod A)   Transfers Chair/bed transfer   Chair/bed transfer method: Stand pivot Chair/bed transfer assist level: Moderate assist (Pt 50 - 74%/lift or lower) Chair/bed transfer assistive device: Armrests Mechanical lift: Maximove   Locomotion Ambulation     Max distance: 100' Assist level: 2 helpers   Chief Financial Officer Comprehension Comprehension assist level: Follows basic conversation/direction with no assist  Expression Expression assist level: Expresses basic needs/ideas: With no assist  Social Interaction Social Interaction assist level: Interacts appropriately 90% of the time - Needs monitoring or encouragement for participation or interaction.  Problem Solving Problem solving assist level: Solves basic problems with no assist  Memory Memory assist level: Recognizes or recalls 90% of the time/requires cueing < 10% of the time  Medical Problem List and Plan: 1. Tetraparesis, secondary to Central cord syndrome  Continue CIR PT OT 2. DVT Prophylaxis/Anticoagulation: Pharmaceutical:Lovenox 3. Pain Management:Oxycodone prn. Continue Neurontin at bedtime 4. Mood:LCSW to follow for evaluation and support. 5. Neuropsych: This patientiscapable of making decisions on hisown behalf. 6. Skin/Wound Care:routine pressure relief measures. 7. Fluids/Electrolytes/Nutrition:Encourage p.o. Intake.  Marland Kitchen  Excellent increased take of fluids 1600+ on 06/09/2018 8. HTN: Poorly controlled at baseline with SBP up to 200's. Monitor BID. Continue norvasc, coreg bid and clonidine   Vitals:   06/10/18 0438 06/10/18 0753  BP: (!) 137/50 (!) 98/47  Pulse: (!) 52 61  Resp: 18   Temp: 98.6 F (37 C)   SpO2: 100% 100%  Controlled 06/10/2018, given tetraplegia would expect lower systolic values 9. Right ear infection?: continue ciprodex bid.  10. Constipation: continue Miralax daily  and  suppository daily.  Patient with bowel movement this morning 11. PTSD with anxiety/depression/nightmares: Managed with Paxil for mood stabilization and trazodone/melatonin for sleep.  12. Emphysema: On albuterol prn. 13. ABLA: Continue to monitor--most recent H/H--8.5/26.6 on 06/10/2018  -Continue to follow for further trend  -Iron supplementation  -Check stool guaiac if further drop 14. Hyponatremia: Improving from 127-->133-->134 6/21 , asymptomatic  LOS (Days) 3 A FACE TO FACE EVALUATION WAS PERFORMED  Charlett Blake, MD 06/10/2018 11:30 AM

## 2018-06-10 NOTE — Progress Notes (Signed)
Physical Therapy Session Note  Patient Details  Name: Gregory Hurley MRN: [id number] Date of Birth: 1946/10/01  Today's Date: 06/10/2018 PT Individual Time: 1102-1200 and 7902-4097 PT Individual Time Calculation (min): 58 min and 58 min  Short Term Goals: Week 1:  PT Short Term Goal 1 (Week 1): Pt will transfer with min assist and LRAD PT Short Term Goal 2 (Week 1): Pt will ambulate 100' with LRAD and min assist +1 PT Short Term Goal 3 (Week 1): Pt will negotiate 4 steps with 2 rails and mod assist.  PT Short Term Goal 4 (Week 1): Pt will propel w/c 100' with min assist.   Skilled Therapeutic Interventions/Progress Updates:    Session 1: Pt seated in w/c upon PT arrival, agreeable to therapy tx and denies pain, reports tingling in B hands/feet. Pt transported to the gym in w/c. Pt performed sit<>stand from w/c with RW and mod assist, verbal cues for hand placement and techniques. Pt performed stand pivot with min assist to the mat. Pt performed 2 x 5 sit<>stands for LE strengthening with RW, fading to min assist. Pt ambulated 2 x 70 ft with RW and min assist, verbal cues for upright posture and RW management. Pt ascended/descended 6 steps (3inch) this session with mod assist using B handrails, verbal cues for safety and step to pattern. Pt participated in berg balance test this session as detailed below, scored 31/56 and therapist discussed results with pt. Pt performed stand pivot back to w/c with RW and min assist, transported back to room. Pt left seated in w/c with chair alarm set and needs in reach.   Session 2: Pt supine in bed upon PT arrival, agreeable to therapy tx and denies pain. Pt transferred to sitting EOB with min assist, verbal cues for techniques. Pt transferred sit<>stand with min assist and stand pivot to w/c with min assist and RW. Pt worked on w/c propulsion with B UEs and B LEs, x 40 ft with increased time, supervision. Pt performed stand pivot to nustep with RW and min assist,  used nustep for B UE/LE strengthening on workload 5, x 5 minutes. Pt ambulated within the dayroom with RW and min assist 2 x 65 ft, verbal cues for upright posture and RW management. Pt transported to gym, stand pivot to the mat with min assist and RW. Pt worked on standing balance and UE strength/coordination with clothespin task, verbal cues for techniques to using R hand. Pt ambulated x 45 ft to w/c with RW and min assist, transported back to room in w/c and left seated with needs in reach, QRB in place and chair alarm set.  Therapy Documentation Precautions:  Precautions Precautions: Fall, Other (comment) Precaution Comments: per previous therapy documention cervical precautions Restrictions Weight Bearing Restrictions: No   Balance Balance Balance Assessed: Yes Standardized Balance Assessment Standardized Balance Assessment: Berg Balance Test Berg Balance Test Sit to Stand: Able to stand  independently using hands Standing Unsupported: Able to stand 2 minutes with supervision Sitting with Back Unsupported but Feet Supported on Floor or Stool: Able to sit 2 minutes under supervision Stand to Sit: Controls descent by using hands Transfers: Needs one person to assist Standing Unsupported with Eyes Closed: Able to stand 10 seconds with supervision Standing Ubsupported with Feet Together: Able to place feet together independently but unable to hold for 30 seconds From Standing, Reach Forward with Outstretched Arm: Can reach forward >5 cm safely (2") From Standing Position, Pick up Object from Floor: Able to pick  up shoe, needs supervision From Standing Position, Turn to Look Behind Over each Shoulder: Turn sideways only but maintains balance Turn 360 Degrees: Able to turn 360 degrees safely but slowly Standing Unsupported, Alternately Place Feet on Step/Stool: Able to complete >2 steps/needs minimal assist Standing Unsupported, One Foot in Front: Able to take small step independently and  hold 30 seconds Standing on One Leg: Tries to lift leg/unable to hold 3 seconds but remains standing independently Total Score: 31   See Function Navigator for Current Functional Status.   Therapy/Group: Individual Therapy  Netta Corrigan, PT, DPT 06/10/2018, 7:54 AM

## 2018-06-11 ENCOUNTER — Inpatient Hospital Stay (HOSPITAL_COMMUNITY): Payer: PPO | Admitting: Physical Therapy

## 2018-06-11 ENCOUNTER — Inpatient Hospital Stay (HOSPITAL_COMMUNITY): Payer: PPO

## 2018-06-11 ENCOUNTER — Inpatient Hospital Stay (HOSPITAL_COMMUNITY): Payer: Medicare HMO | Admitting: Occupational Therapy

## 2018-06-11 MED ORDER — GENERIC EXTERNAL MEDICATION
Status: DC
Start: ? — End: 2018-06-11

## 2018-06-11 NOTE — Progress Notes (Signed)
Occupational Therapy Session Note  Patient Details  Name: Gregory Hurley MRN: [id number] Date of Birth: 08-28-1946  Today's Date: 06/11/2018 OT Individual Time: 0700-0827 OT Individual Time Calculation (min): 87 min    Short Term Goals: Week 1:  OT Short Term Goal 1 (Week 1): Pt will wash LUE with RUE with MIN A to demonstrate improved coordination/grasp of R hand OT Short Term Goal 2 (Week 1): Pt will complete BSC/toilet transfer wiht MIN A OT Short Term Goal 3 (Week 1): Pt will advance pants past hips wiht min A OT Short Term Goal 4 (Week 1): Pt will groom in standing at sink for 1 grooming items and MIN A for standing balance OT Short Term Goal 5 (Week 1): Pt will thread 1UE into sleeve of shirt with supervision  Skilled Therapeutic Interventions/Progress Updates:    Upon entering the room, pt supine in bed but agreeable to OT intervention this session. Pt performed supine >sit with min A for trunk. Pt transferred to wheelchair with use of RW and lifting assistance to stand. Pt required set up for breakfast tray to open containers and cut food items. OT placed assisted pt with built up utensils and pt able to feed self with L UE and increased time. Pt declined bathing and dressing this session. Pt ambulation with min A 10' into bathroom with RW. Pt unable to have BM but able to void urine. Pt needing assistance with clothing management. Pt returning to wheelchair and OT assisted pt to day room for time management. Pt engaged in B UE AROM against gravity with controlled movements for shoulder shrugs, elbow flex/ext,and wrist flex/ext. Pt needing PROM for pronation and supination of R UE for stretching as well as digit flexion and extension. Pt able to move R thumb in multiple directions. Pt assisted back to room at end of session with chair alarm activated for safety. Soft call bell attached to shirt and all needs within reach.   Therapy Documentation Precautions:  Precautions Precautions: Fall,  Other (comment) Precaution Comments: per previous therapy documention cervical precautions Restrictions Weight Bearing Restrictions: No  Pain: Pain Assessment Pain Scale: 0-10 Pain Score: 5  Pain Type: Acute pain Pain Location: Arm Pain Orientation: Right Pain Descriptors / Indicators: Aching Pain Frequency: Intermittent Pain Onset: Gradual Patients Stated Pain Goal: 2 Pain Intervention(s): Medication (See eMAR);Repositioned Multiple Pain Sites: No  See Function Navigator for Current Functional Status.   Therapy/Group: Individual Therapy  [name] 06/11/2018, 9:24 AM

## 2018-06-11 NOTE — Progress Notes (Signed)
Occupational Therapy Session Note  Patient Details  Name: Gregory Hurley MRN: [id number] Date of Birth: Jul 09, 1946  Today's Date: 06/11/2018 OT Individual Time: 1100-1200 OT Individual Time Calculation (min): 60 min    Short Term Goals: Week 1:  OT Short Term Goal 1 (Week 1): Pt will wash LUE with RUE with MIN A to demonstrate improved coordination/grasp of R hand OT Short Term Goal 2 (Week 1): Pt will complete BSC/toilet transfer wiht MIN A OT Short Term Goal 3 (Week 1): Pt will advance pants past hips wiht min A OT Short Term Goal 4 (Week 1): Pt will groom in standing at sink for 1 grooming items and MIN A for standing balance OT Short Term Goal 5 (Week 1): Pt will thread 1UE into sleeve of shirt with supervision  Skilled Therapeutic Interventions/Progress Updates:    Pt resting in bed upon arrival.  Pt sat EOB with min A and amb with RW (min A) to BR to use toilet.  Pt required assistance for hygiene and pulling up pants.  Pt transitioned to gym for BUE NMR with emphasis on RUE/hand function.  Pt with R hand edema and Kinesio Tape applied to facilitate.  Pt with improved R hand flexion and trace extension.  Focus also on controlled R elbow flexion/extension.  Pt able to bring R hand to chin and return to table with controlled movements.  Pt returned to room and lunch tray setup for self feeding.  Pt required assistance opening containers and cutting up food.  Foam applied to utensil.  Pt able to self feed.  Pt remained in w/c with soft call bell within reach and QRB in place with chair alarm activated.   Therapy Documentation Precautions:  Precautions Precautions: Fall, Other (comment) Precaution Comments: per previous therapy documention cervical precautions Restrictions Weight Bearing Restrictions: No Pain: Pain Assessment Pain Scale: 0-10 Pain Score: Asleep Pain Type: Acute pain Pain Location: Arm Pain Orientation: Right Pain Descriptors / Indicators: Aching Pain Frequency:  Intermittent Pain Onset: Gradual Patients Stated Pain Goal: 2 Pain Intervention(s): Medication (See eMAR);Repositioned Multiple Pain Sites: No  See Function Navigator for Current Functional Status.   Therapy/Group: Individual Therapy  Leroy Libman 06/11/2018, 12:13 PM

## 2018-06-11 NOTE — Progress Notes (Signed)
Orthopedic Tech Progress Note Patient Details:  Aksel Bencomo Jul 14, 1946 [id number]  Patient ID: Gregory Hurley, male   DOB: 21-May-1946, 72 y.o.   MRN: [id number]   [name] 06/11/2018, 1:01 PMCalled [facility] for right resting hand splint and bilateral Prafo boots.

## 2018-06-11 NOTE — Progress Notes (Signed)
[facility] PHYSICAL MEDICINE & REHABILITATION     PROGRESS NOTE    Subjective/Complaints: Up with OT. No issues this morning. Feels that he's getting a little stronger. Would like a hair cut  ROS: Patient denies fever, rash, sore throat, blurred vision, nausea, vomiting, diarrhea, cough, shortness of breath or chest pain, joint or back pain, headache, or mood change.    Objective:  No results found. Recent Labs    06/09/18 0724 06/10/18 0915  WBC  --  5.3  HGB 7.5* 8.5*  HCT 23.6* 26.6*  PLT  --  223   No results for input(s): NA, K, CL, GLUCOSE, BUN, CREATININE, CALCIUM in the last 72 hours.  Invalid input(s): CO CBG (last 3)  Recent Labs    06/08/18 2142 06/09/18 0633  GLUCAP 153* 106*    Wt Readings from Last 3 Encounters:  06/07/18 75.8 kg (167 lb)  05/31/18 77.1 kg (170 lb)  07/07/17 77.1 kg (170 lb)     Intake/Output Summary (Last 24 hours) at 06/11/2018 0828 Last data filed at 06/11/2018 0448 Gross per 24 hour  Intake 444 ml  Output 1000 ml  Net -556 ml    Vital Signs: Blood pressure (!) 143/60, pulse 66, temperature 99.4 F (37.4 C), temperature source Oral, resp. rate 18, SpO2 100 %. Physical Exam:  Constitutional: No distress . Vital signs reviewed. HEENT: EOMI, oral membranes moist Neck: supple Cardiovascular: RRR without murmur. No JVD    Respiratory: CTA Bilaterally without wheezes or rales. Normal effort    GI: BS +, non-tender, non-distended  Musculoskeletal: He exhibits ongoing sensitivity to touch in his arms and legs as well as trunk, edema RUE  Neurologic Decreased light touch and pain sensation in both arms and legs (1/2). Motor right upper extremity is 4 out of 5 right biceps and deltoid, 0 out of 5 elbow flexion, wrist extension, hand intrinsics.  Left upper extremity is 4 out of 5 left deltoid and biceps, 2 out of 5 triceps, 2+ out of 5 finger flexors and wrist extensors as well as hand intrinsics---motor exam stable Lower  extremities 3+ to 4- out of 5 proximal distal. Psychiatric: Patient pleasant and appropriate  Assessment/Plan: 1.  Functional and mobility deficits secondary to cervical central cord syndrome which require 3+ hours per day of interdisciplinary therapy in a comprehensive inpatient rehab setting. Physiatrist is providing close team supervision and 24 hour management of active medical problems listed below. Physiatrist and rehab team continue to assess barriers to discharge/monitor patient progress toward functional and medical goals.  Function:  Bathing Bathing position   Position: Other (comment)(Sitting on BSC )  Bathing parts Body parts bathed by patient: Right arm, Chest, Abdomen, Front perineal area, Right upper leg, Left upper leg Body parts bathed by helper: Left arm, Back, Buttocks, Right lower leg, Left lower leg  Bathing assist Assist Level: Touching or steadying assistance(Pt > 75%)      Upper Body Dressing/Undressing Upper body dressing   What is the patient wearing?: Pull over shirt/dress     Pull over shirt/dress - Perfomed by patient: Thread/unthread left sleeve, Pull shirt over trunk Pull over shirt/dress - Perfomed by helper: Thread/unthread right sleeve, Put head through opening        Upper body assist Assist Level: Touching or steadying assistance(Pt > 75%)      Lower Body Dressing/Undressing Lower body dressing   What is the patient wearing?: Pants, Non-skid slipper socks       Pants- Performed by helper:  Thread/unthread right pants leg, Thread/unthread left pants leg, Pull pants up/down   Non-skid slipper socks- Performed by helper: Don/doff right sock, Don/doff left sock                  Lower body assist Assist for lower body dressing: (max A)      Toileting Toileting   Toileting steps completed by patient: Performs perineal hygiene Toileting steps completed by helper: Adjust clothing prior to toileting, Performs perineal hygiene, Adjust  clothing after toileting    Toileting assist Assist level: Touching or steadying assistance (Pt.75%)   Transfers Chair/bed transfer   Chair/bed transfer method: Stand pivot Chair/bed transfer assist level: Touching or steadying assistance (Pt > 75%) Chair/bed transfer assistive device: Armrests, Walker Mechanical lift: Maximove   Locomotion Ambulation     Max distance: 70 ft Assist level: Touching or steadying assistance (Pt > 75%)   Wheelchair          Cognition Comprehension Comprehension assist level: Follows basic conversation/direction with no assist  Expression Expression assist level: Expresses basic needs/ideas: With no assist  Social Interaction Social Interaction assist level: Interacts appropriately 90% of the time - Needs monitoring or encouragement for participation or interaction.  Problem Solving Problem solving assist level: Solves basic 90% of the time/requires cueing < 10% of the time  Memory Memory assist level: Recognizes or recalls 90% of the time/requires cueing < 10% of the time  Medical Problem List and Plan: 1. Tetraparesis, secondary to Central cord syndrome  Continue CIR PT OT 2. DVT Prophylaxis/Anticoagulation: Pharmaceutical:Lovenox 3. Pain Management:Oxycodone prn. Continue Neurontin at bedtime 4. Mood:LCSW to follow for evaluation and support. 5. Neuropsych: This patientiscapable of making decisions on hisown behalf. 6. Skin/Wound Care:routine pressure relief measures. 7. Fluids/Electrolytes/Nutrition:   . Good po intake 8. HTN: Poorly controlled at baseline with SBP up to 200's. Monitor BID. Continue norvasc, coreg bid and clonidine   Vitals:   06/10/18 1920 06/11/18 0432  BP: (!) 173/68 (!) 143/60  Pulse: 67 66  Resp: 18 18  Temp: 98 F (36.7 C) 99.4 F (37.4 C)  SpO2: 100% 100%  Controlled 06/11/2018 --no changes 9. Right ear infection?: continue ciprodex bid.  10. Constipation: continue Miralax daily and  suppository  daily.  Patient with bowel movement this morning 11. PTSD with anxiety/depression/nightmares: Managed with Paxil for mood stabilization and trazodone/melatonin for sleep.  12. Emphysema: On albuterol prn. 13. ABLA: Continue to monitor--most recent H/H--8.5/26.6 on 06/10/2018  -Continue to follow for further trend  -Iron supplementation  -Check stool guaiac if further drop 14. Hyponatremia: Improving from 127-->133-->134 6/21   -recheck this week   LOS (Days) [place] EVALUATION WAS PERFORMED  Meredith Staggers, MD 06/11/2018 8:28 AM

## 2018-06-11 NOTE — Progress Notes (Signed)
Physical Therapy Session Note  Patient Details  Name: Gregory Hurley MRN: [id number] Date of Birth: 1946/10/20  Today's Date: 06/11/2018 PT Individual Time:  -      Short Term Goals: Week 1:  PT Short Term Goal 1 (Week 1): Pt will transfer with min assist and LRAD PT Short Term Goal 2 (Week 1): Pt will ambulate 100' with LRAD and min assist +1 PT Short Term Goal 3 (Week 1): Pt will negotiate 4 steps with 2 rails and mod assist.  PT Short Term Goal 4 (Week 1): Pt will propel w/c 100' with min assist.   Skilled Therapeutic Interventions/Progress Updates:    Pt reported no pain prior to the start of his treatment session. PT initiated gait training with lite gait. Pt demo'd improved standing tolerance while therapists donned lite gait harness and Pt initiated step up to treadmill with steadying assist. PT provided B LE advancement for swing and stance phases of gait; however, PT initiated overground walking for 202 feet with steadying assist due to lite gait treadmill not working. Pt required x1 standing rest break during ambulation of gait due to fatigue onset. Pt required verbal cues to look up and maintain adequate step length to prevent LE's from crossing. PT initiated training with NuStep for 6 minutes to promote reciprocal movement of extremities for NMR. Pt performed all STS with mod A for lifting and min A for steadying to perform stand pivot transfers. PT provided verbal cues for sequencing STS's including adequate trunk flexion, extremity placement, and momentum to assist with transitional movement. Pt was returned to room with call bell and tray table in reach with all needs met.   Therapy Documentation Precautions:  Precautions Precautions: Fall, Other (comment) Precaution Comments: per previous therapy documention cervical precautions Restrictions Weight Bearing Restrictions: No    See Function Navigator for Current Functional Status.   Therapy/Group: Individual Therapy  Floreen Comber 06/11/2018, 4:05 PM

## 2018-06-12 ENCOUNTER — Inpatient Hospital Stay (HOSPITAL_COMMUNITY): Payer: PPO

## 2018-06-12 ENCOUNTER — Inpatient Hospital Stay (HOSPITAL_COMMUNITY): Payer: Medicare HMO | Admitting: Occupational Therapy

## 2018-06-12 ENCOUNTER — Inpatient Hospital Stay (HOSPITAL_COMMUNITY): Payer: PPO | Admitting: Physical Therapy

## 2018-06-12 MED ORDER — GENERIC EXTERNAL MEDICATION
Status: DC
Start: ? — End: 2018-06-12

## 2018-06-12 NOTE — Progress Notes (Signed)
Occupational Therapy Session Note  Patient Details  Name: Gregory Hurley MRN: [id number] Date of Birth: 07-05-1946  Today's Date: 06/12/2018 OT Individual Time: 1100-1200 OT Individual Time Calculation (min): 60 min    Short Term Goals: Week 1:  OT Short Term Goal 1 (Week 1): Pt will wash LUE with RUE with MIN A to demonstrate improved coordination/grasp of R hand OT Short Term Goal 2 (Week 1): Pt will complete BSC/toilet transfer wiht MIN A OT Short Term Goal 3 (Week 1): Pt will advance pants past hips wiht min A OT Short Term Goal 4 (Week 1): Pt will groom in standing at sink for 1 grooming items and MIN A for standing balance OT Short Term Goal 5 (Week 1): Pt will thread 1UE into sleeve of shirt with supervision  Skilled Therapeutic Interventions/Progress Updates:    Pt asleep in bed upon arrival but easily aroused.  Pt required min A for supine>sit EOB and perform stand pivot transfer to w/c without AD.  Initial focus on sit<>stand and standing balance with occasional steady A. Pt with increased edema noted in RUE.  Pt stated that nursing staff did not remove wrist support splint during the night.  Previous OTR placed sign over bed and notified staff to prevent future occurrences. Kinesio Tape removed and retrograde massage performed for edema management.  Kinesio Tape reapplied for edema management.  Pt's lunch set up and pt able to self feed with adaptive utensils.  Pt remained in w/c with all needs within reach, QRB in place, chair alarm activated, and soft call bell placed for easy activation.   Therapy Documentation Precautions:  Precautions Precautions: Fall, Other (comment) Precaution Comments: per previous therapy documention cervical precautions Restrictions Weight Bearing Restrictions: No Pain:  Pt denies pain  See Function Navigator for Current Functional Status.   Therapy/Group: Individual Therapy  Leroy Libman 06/12/2018, 12:05 PM

## 2018-06-12 NOTE — Progress Notes (Signed)
Occupational Therapy Session Note  Patient Details  Name: Gregory Hurley MRN: [id number] Date of Birth: June 29, 1946  Today's Date: 06/12/2018 OT Individual Time: 1100-1200 OT Individual Time Calculation (min): 60 min    Short Term Goals: Week 1:  OT Short Term Goal 1 (Week 1): Pt will wash LUE with RUE with MIN A to demonstrate improved coordination/grasp of R hand OT Short Term Goal 2 (Week 1): Pt will complete BSC/toilet transfer wiht MIN A OT Short Term Goal 3 (Week 1): Pt will advance pants past hips wiht min A OT Short Term Goal 4 (Week 1): Pt will groom in standing at sink for 1 grooming items and MIN A for standing balance OT Short Term Goal 5 (Week 1): Pt will thread 1UE into sleeve of shirt with supervision  Skilled Therapeutic Interventions/Progress Updates:    Upon entering the room, pt supine in bed with RN present giving medications. Pt agreeable to OT intervention. Pt with increased pain in R UE and OT noted that pt wearing wrong splint to bed last night and increased swelling. Splint removed during this session. Pt performed sit >stand with mod assistance ambulating to sink with min A overall and use of RW. Pt engaged in bathing tasks with sit <>stand from wheelchair at sink. Pt trialing bath mitt with L and R UE this session. Pt with much more control with use of L UE to wash self. Pt standing and able to wash peri area without drop of cloth but unable to was buttocks without assistance. Pt required mod A for UB dressing in order to thread R UE and pull over neck. OT focus on hemiplegic dressing techniques to increase I with this task. Pt required max A for LB dressing and min A for standing balance. Pt returning to wheelchair for breakfast with OT opening containers and cutting food. Pt able to feed self with L UE and without use of built up utensil. Call bell and all needed items within reach upon exiting the room.   Therapy Documentation Precautions:  Precautions Precautions:  Fall, Other (comment) Precaution Comments: per previous therapy documention cervical precautions Restrictions Weight Bearing Restrictions: No  See Function Navigator for Current Functional Status.   Therapy/Group: Individual Therapy  [name] 06/12/2018, 12:27 PM

## 2018-06-12 NOTE — Evaluation (Signed)
Recreational Therapy Assessment and Plan  Patient Details  Name: Gregory Hurley MRN: [id number] Date of Birth: 1946/03/19 Today's Date: 06/12/2018  Rehab Potential: Good ELOS: 3 weeks Assessment Problem List:      Patient Active Problem List   Diagnosis Date Noted  . Central cord syndrome ([place]) 06/07/2018  . Syncope 07/07/2017  . HTN (hypertension) 07/07/2017  . Depression 07/07/2017  . CKD (chronic kidney disease), stage III ([place]) 07/07/2017  . Hyponatremia 07/07/2017  . Hypokalemia 07/07/2017    Past Medical History:      Past Medical History:  Diagnosis Date  . Anemia   . CKD (chronic kidney disease), stage III ([place])   . Elevated alkaline phosphatase level   . History of fracture of clavicle   . History of fracture of rib   . Hypertension   . Pancytopenia ([place])   . PTSD (post-traumatic stress disorder)    FOLLOWED AT [facility]  . Thrombocytopenia ([place])    Past Surgical History:       Past Surgical History:  Procedure Laterality Date  . NO PAST SURGERIES      Assessment & Plan Clinical Impression: 72 year old male with history of CKD, HTN, PTSD who was admitted to [facility] on 05/31/18 after fall with numbness and tingling BUE/BLE as well as weakness BUE/RLE>LLE  due to multiple cervical fractures. He reported feeling lightheaded followed by unresponsive episode and was down about 2 hours prior to calling EMS. UDS positive for cocaine, opiates and THC.  Work up revealed ankylosed spine with C6/C7 fracture with instability, cord contusion C5-C6, diffuse epidural hematoma from foramen magnum into thoracic spinal canal, right gluteus maximus hematoma and severe centrilobular emphysema. He was taken to OR for C3-T1 PSF with decompression by Dr. Binnie Rail. Hospital course significant for acute hypoxic respiratory failure with hypercarbia, hyponatremia, ABLA and right ear pain treated with ciprodex.  Patient with incomplete quadriparesis as well as problems with  processing affecting functional status and CIR recommended for follow up therapy.    Pt presents with decreased activity tolerance, decreased functional mobility, decreased balance, decreased coordination Limiting pt's independence with leisure/community pursuits.  Plan Min 1 TR session >20 minutes during LOS Recommendations for other services: Neuropsych  Discharge Criteria: Patient will be discharged from TR if patient refuses treatment 3 consecutive times without medical reason.  If treatment goals not met, if there is a change in medical status, if patient makes no progress towards goals or if patient is discharged from hospital.  The above assessment, treatment plan, treatment alternatives and goals were discussed and mutually agreed upon: by patient  [place] 06/12/2018, 2:28 PM

## 2018-06-12 NOTE — Progress Notes (Signed)
Physical Therapy Session Note  Patient Details  Name: Gregory Hurley MRN: [id number] Date of Birth: [birth year]  Today's Date: 06/12/2018 PT Individual Time: 1335-1445 PT Individual Time Calculation (min): 70 min   Short Term Goals: Week 1:  PT Short Term Goal 1 (Week 1): Pt will transfer with min assist and LRAD PT Short Term Goal 2 (Week 1): Pt will ambulate 100' with LRAD and min assist +1 PT Short Term Goal 3 (Week 1): Pt will negotiate 4 steps with 2 rails and mod assist.  PT Short Term Goal 4 (Week 1): Pt will propel w/c 100' with min assist.   Skilled Therapeutic Interventions/Progress Updates:   Pt in w/c and agreeable to therapy, denies pain at rest but intermittently c/o painful tingling in hands and feet w/ activity. Total assist w/c transport to/from therapy gym. Worked on overall endurance w/ prolonged gait/standing, functional standing balance, and LE strengthening this session. Ambulated 100' w/ min guard using RW and w/c follow for safety. Performed multiple bouts of prolonged standing, 4-5 min at a time while participating in kicking and [place] ball back and forth w/ 2nd helper. Additionally did alternating tossing and kicking w/o UE support, min guard to close supervision but no overt LOB. Performed clothespin task w/o UE support requiring reaching forward and trunk rotation. Performed LE strengthening exercises including 5x sit<>stands from progressively lower mat w/o UE push-up (4 bouts in total), standing knee marches 3x5, and mini squats 3x10. Returned to room and assisted w/ toileting, ambulating to/from toilet w/ min guard. Total assist for LE garment management. Ended session in supine, call bell within reach and all needs met.   Therapy Documentation Precautions:  Precautions Precautions: Fall, Other (comment) Precaution Comments: per previous therapy documention cervical precautions Restrictions Weight Bearing Restrictions: No   See Function Navigator for  Current Functional Status.   Therapy/Group: Individual Therapy  Shamon Cothran K Arnette 06/12/2018, 2:45 PM

## 2018-06-12 NOTE — Progress Notes (Signed)
[place] PHYSICAL MEDICINE & REHABILITATION     PROGRESS NOTE    Subjective/Complaints: Up in chair working on breakfast. Had wrong splint on last nightt---caused more swelling in RUE today  ROS: Patient denies fever, rash, sore throat, blurred vision, nausea, vomiting, diarrhea, cough, shortness of breath or chest pain, joint or back pain, headache, or mood change. .    Objective:  No results found. Recent Labs    06/10/18 0915  WBC 5.3  HGB 8.5*  HCT 26.6*  PLT 223   No results for input(s): NA, K, CL, GLUCOSE, BUN, CREATININE, CALCIUM in the last 72 hours.  Invalid input(s): CO CBG (last 3)  No results for input(s): GLUCAP in the last 72 hours.  Wt Readings from Last 3 Encounters:  06/07/18 75.8 kg (167 lb)  05/31/18 77.1 kg (170 lb)  07/07/17 77.1 kg (170 lb)     Intake/Output Summary (Last 24 hours) at 06/12/2018 0831 Last data filed at 06/12/2018 0237 Gross per 24 hour  Intake 480 ml  Output 1150 ml  Net -670 ml    Vital Signs: Blood pressure (!) 137/57, pulse 62, temperature 98.6 F (37 C), resp. rate 18, SpO2 99 %. Physical Exam:  Constitutional: No distress . Vital signs reviewed. HEENT: EOMI, oral membranes moist Neck: supple Cardiovascular: RRR without murmur. No JVD    Respiratory: CTA Bilaterally without wheezes or rales. Normal effort    GI: BS +, non-tender, non-distended   Musculoskeletal: He exhibits ongoing sensitivity to touch in his arms and legs as well as trunk, edema RUE 1+  Neurologic Decreased light touch and pain sensation in both arms and legs (1/2). Motor right upper extremity is 4 out of 5 right biceps and deltoid, 0 to tr out of 5 elbow flexion, wrist extension, hand intrinsics.  Left upper extremity is 4 out of 5 left deltoid and biceps, 2 out of 5 triceps, 2+ out of 5 finger flexors and wrist extensors as well as hand intrinsics---motor exam stable Lower extremities 3+ to 4- out of 5 proximal distal. Psychiatric: Patient  pleasant and appropriate  Assessment/Plan: 1.  Functional and mobility deficits secondary to cervical central cord syndrome which require 3+ hours per day of interdisciplinary therapy in a comprehensive inpatient rehab setting. Physiatrist is providing close team supervision and 24 hour management of active medical problems listed below. Physiatrist and rehab team continue to assess barriers to discharge/monitor patient progress toward functional and medical goals.  Function:  Bathing Bathing position   Position: Other (comment)(Sitting on BSC )  Bathing parts Body parts bathed by patient: Right arm, Chest, Abdomen, Front perineal area, Right upper leg, Left upper leg Body parts bathed by helper: Left arm, Back, Buttocks, Right lower leg, Left lower leg  Bathing assist Assist Level: Touching or steadying assistance(Pt > 75%)      Upper Body Dressing/Undressing Upper body dressing   What is the patient wearing?: Pull over shirt/dress     Pull over shirt/dress - Perfomed by patient: Thread/unthread left sleeve, Pull shirt over trunk Pull over shirt/dress - Perfomed by helper: Thread/unthread right sleeve, Put head through opening        Upper body assist Assist Level: Touching or steadying assistance(Pt > 75%)      Lower Body Dressing/Undressing Lower body dressing   What is the patient wearing?: Pants, Non-skid slipper socks       Pants- Performed by helper: Thread/unthread right pants leg, Thread/unthread left pants leg, Pull pants up/down   Non-skid slipper  socks- Performed by helper: Don/doff right sock, Don/doff left sock                  Lower body assist Assist for lower body dressing: (max A)      Toileting Toileting   Toileting steps completed by patient: Adjust clothing prior to toileting Toileting steps completed by helper: Adjust clothing prior to toileting, Performs perineal hygiene, Adjust clothing after toileting    Toileting assist Assist level:  Touching or steadying assistance (Pt.75%)   Transfers Chair/bed transfer   Chair/bed transfer method: Stand pivot Chair/bed transfer assist level: Touching or steadying assistance (Pt > 75%) Chair/bed transfer assistive device: Armrests, Walker Mechanical lift: Chiropractor     Max distance: 202 Assist level: Touching or steadying assistance (Pt > 75%)   Wheelchair          Cognition Comprehension Comprehension assist level: Understands basic 90% of the time/cues < 10% of the time  Expression Expression assist level: Expresses basic needs/ideas: With no assist  Social Interaction Social Interaction assist level: Interacts appropriately 90% of the time - Needs monitoring or encouragement for participation or interaction.  Problem Solving Problem solving assist level: Solves basic 90% of the time/requires cueing < 10% of the time  Memory Memory assist level: Recognizes or recalls 90% of the time/requires cueing < 10% of the time  Medical Problem List and Plan: 1. Tetraparesis, secondary to Central cord syndrome  Continue CIR PT OT  -team conf today  -resting WHO RUE at night 2. DVT Prophylaxis/Anticoagulation: Pharmaceutical:Lovenox 3. Pain Management:Oxycodone prn. Continue Neurontin at bedtime 4. Mood:LCSW to follow for evaluation and support. 5. Neuropsych: This patientiscapable of making decisions on hisown behalf. 6. Skin/Wound Care:routine pressure relief measures. 7. Fluids/Electrolytes/Nutrition:    Good po intake 8. HTN: Poorly controlled at baseline with SBP up to 200's. Monitor BID. Continue norvasc, coreg bid and clonidine   Vitals:   06/11/18 2122 06/12/18 0418  BP: (!) 182/69 (!) 137/57  Pulse: 68 62  Resp: 18 18  Temp: 98.2 F (36.8 C) 98.6 F (37 C)  SpO2: 100% 99%  watch for continued spikes. No changes today 9. Right ear infection?: continue ciprodex bid.  10. Constipation: continue Miralax daily and  suppository  daily. Needs bm today 11. PTSD with anxiety/depression/nightmares: Managed with Paxil for mood stabilization and trazodone/melatonin for sleep.  12. Emphysema: On albuterol prn. 13. ABLA: Continue to monitor--most recent H/H--8.5/26.6 on 06/10/2018  -Continue to follow for further trend---recheck Thursday  -Iron supplementation  -Check stool guaiac if further drop 14. Hyponatremia: Improving from 127-->133-->134 6/21   -recheck Thursday   LOS (Days) [place] EVALUATION WAS PERFORMED  Meredith Staggers, MD 06/12/2018 8:31 AM

## 2018-06-13 ENCOUNTER — Ambulatory Visit (HOSPITAL_COMMUNITY): Payer: PPO | Admitting: Occupational Therapy

## 2018-06-13 ENCOUNTER — Inpatient Hospital Stay (HOSPITAL_COMMUNITY): Payer: PPO

## 2018-06-13 ENCOUNTER — Inpatient Hospital Stay (HOSPITAL_COMMUNITY): Payer: Medicare HMO | Admitting: Occupational Therapy

## 2018-06-13 ENCOUNTER — Inpatient Hospital Stay (HOSPITAL_COMMUNITY): Payer: PPO | Admitting: Physical Therapy

## 2018-06-13 DIAGNOSIS — I10 Essential (primary) hypertension: Secondary | ICD-10-CM

## 2018-06-13 DIAGNOSIS — F4310 Post-traumatic stress disorder, unspecified: Secondary | ICD-10-CM

## 2018-06-13 LAB — BASIC METABOLIC PANEL
ANION GAP: 6 (ref 5–15)
BUN: 13 mg/dL (ref 8–23)
CHLORIDE: 105 mmol/L (ref 98–111)
CO2: 25 mmol/L (ref 22–32)
Calcium: 8.4 mg/dL — ABNORMAL LOW (ref 8.9–10.3)
Creatinine, Ser: 1.19 mg/dL (ref 0.61–1.24)
GFR, EST NON AFRICAN AMERICAN: 60 mL/min — AB (ref >60)
Glucose, Bld: 93 mg/dL (ref 70–99)
Potassium: 4 mmol/L (ref 3.5–5.1)
SODIUM: 136 mmol/L (ref 135–145)

## 2018-06-13 LAB — CBC
HEMATOCRIT: 24.5 % — AB (ref 39.0–52.0)
HEMOGLOBIN: 7.9 g/dL — AB (ref 13.0–17.0)
MCH: 27.7 pg (ref 26.0–34.0)
MCHC: 32.2 g/dL (ref 30.0–36.0)
MCV: 86 fL (ref 78.0–100.0)
Platelets: 206 10*3/uL (ref 150–400)
RBC: 2.85 MIL/uL — AB (ref 4.22–5.81)
RDW: 15.1 % (ref 11.5–15.5)
WBC: 5.1 10*3/uL (ref 4.0–10.5)

## 2018-06-13 MED ORDER — GENERIC EXTERNAL MEDICATION
Status: DC
Start: ? — End: 2018-06-13

## 2018-06-13 NOTE — Progress Notes (Signed)
Recreational Therapy Session Note  Patient Details  Name: Izekiel Flegel MRN: [id number] Date of Birth: 1946-09-12 Today's Date: 06/13/2018 Time:  1102-1130  Pain: no c/o Skilled Therapeutic Interventions/Progress Updates: Session focused on activity tolerance, standing tolerance, dynamic standing balance, BUE use, safety awareness during co-treat with OT.  Pt sat w/c level for "zoom ball" task using BUE's with supervision progressing to standing for "zoom ball" with close supervision-contact guard assist.  Pt able to recognize need for rest and initiated seated breaks as needed throughout session.  Progressed to standing on compliant surface for ball tap activity with 1 UE -0 UE support with contact guard assist.   Demetries Coia 06/13/2018, 3:48 PM

## 2018-06-13 NOTE — Progress Notes (Signed)
Occupational Therapy Session Note  Patient Details  Name: Gregory Hurley MRN: [id number] Date of Birth: 1946-08-30  Today's Date: 06/13/2018 OT Individual Time: 1045-1130 OT Individual Time Calculation (min): 45 min    Short Term Goals: Week 1:  OT Short Term Goal 1 (Week 1): Pt will wash LUE with RUE with MIN A to demonstrate improved coordination/grasp of R hand OT Short Term Goal 2 (Week 1): Pt will complete BSC/toilet transfer wiht MIN A OT Short Term Goal 3 (Week 1): Pt will advance pants past hips wiht min A OT Short Term Goal 4 (Week 1): Pt will groom in standing at sink for 1 grooming items and MIN A for standing balance OT Short Term Goal 5 (Week 1): Pt will thread 1UE into sleeve of shirt with supervision  Skilled Therapeutic Interventions/Progress Updates:    Treatment session consisted of ADLs/self care training, transfer training, pt education, BUE NMR and strengthening, balance training, and endurance training. Upon entering room, pt seated in w/c and reported to have not brushed teeth today. Pt completed oral care at sink with S for tooth brush with built up handle. Completed UB dressing with and required max A to doff T shirt and mod A with v/c using "hemiplegic" dressing technique for donning shirt. Pt was transferred via w/c to therapy gym to work on balance, endurance, and B UE use with static and dynamic activities in seated an standing. Focused on weight shifting, balance retraining and self correcting. Pt practiced in seated, standing, and on foam pad in standing with walker support PRN for ball toss activities and ball tapping activities. Encouraged righting response and reactions with minimal support from therapist. Pt able to recognize and request a rest break when fatigued. Pt tolerating around 1-2 minutes of standing while performing activity before fatiguing. Therapist continued to encourage good safety awareness with transfers. Pt continues to require mod A with sit<>stand  transfers with v/c for hand/foot placemment. Pt requested to complete functional mobility at walker level down hallway and able to walk 75" with 2 rest breaks before fatigued. Pt taken back to room via wheelchair and was set up for lunch with chair alarm and call bell within reach. No c/o pain at this time.   Therapy Documentation Precautions:  Precautions Precautions: Fall, Other (comment) Precaution Comments: per previous therapy documention cervical precautions Restrictions Weight Bearing Restrictions: No   Pain: Pain Assessment Pain Score: 0-No pain  See Function Navigator for Current Functional Status.   Therapy/Group: Individual Therapy  Delon [place] 06/13/2018, 12:36 PM

## 2018-06-13 NOTE — Patient Care Conference (Signed)
Inpatient RehabilitationTeam Conference and Plan of Care Update Date: 06/12/2018   Time: 2:20 PM    Patient Name: Gregory Hurley      Medical Record Number: [id number]  Date of Birth: 04-Sep-1946 Sex: Male         Room/Bed: 4W08C/4W08C-01 Payor Info: Payor: [employer] MEDICARE / Plan: [employer] MEDICARE HMO / Product Type: *No Product type* /    Admitting Diagnosis: Central cord Syndrome  Admit Date/Time:  06/07/2018  3:57 PM Admission Comments: No comment available   Primary Diagnosis:  <principal problem not specified> Principal Problem: <principal problem not specified>  Patient Active Problem List   Diagnosis Date Noted  . Central cord syndrome ([place]) 06/07/2018  . Syncope 07/07/2017  . HTN (hypertension) 07/07/2017  . Depression 07/07/2017  . CKD (chronic kidney disease), stage III ([place]) 07/07/2017  . Hyponatremia 07/07/2017  . Hypokalemia 07/07/2017    Expected Discharge Date: Expected Discharge Date: 06/22/18  Team Members Present: Physician leading conference: Dr. Alger Simons Social Worker Present: Lennart Pall, LCSW Nurse Present: Other (comment)(Kaitlyn Dara Lords, RN) PT Present: Canary Brim, PT OT Present: Benay Pillow, OT;Roanna Epley, COTA PPS Coordinator present : Daiva Nakayama, RN, CRRN     Current Status/Progress Goal Weekly Team Focus  Medical   C5 cervical central cord syndrome.  Patient seems quite motivated.  Right arm affected more than left.  Bowel and bladder function appears to be intact  Improve functional mobility and use of upper limbs  Blood pressure control, wound care and pain management   Bowel/Bladder   Continent of bowel/baldder; bowel program. LBM 4/24  To be able to have regular bowel movement with min assist  Assess bowel/badeer function q shift and as needed   Swallow/Nutrition/ Hydration             ADL's   mod A sit <>stand , min - mod A functional transfers, min A short distance ambulation, UB self care with mod A, LB self care max A  supervision  overall  AE for self care tasks, B UE strengthening and coordination, balance, safety awareness, functional transfers/mobility   Mobility   min for bed mobility and gait, mod for transfers due to height/low surfaces  supervision, ambulatory with RW  balance, LE strengthening/NMR, all functional mobility progression   Communication             Safety/Cognition/ Behavioral Observations            Pain   Complains of pain at times, tylenol PRN   <2  assess and treat pain q shift and as needed   Skin   Surgical ioncision to the back of his with stapples  Skin to be free of breakdown/infection with min assist  Assess skin q shift and as needed    Rehab Goals Patient on target to meet rehab goals: Yes *See Care Plan and progress notes for long and short-term goals.     Barriers to Discharge  Current Status/Progress Possible Resolutions Date Resolved   Physician    Medical stability               Nursing                  PT                    OT                  SLP  SW                Discharge Planning/Teaching Needs:  Pt to d/c home with family providing 24/7 assistance.  Teaching to be completed closer to d/c.   Team Discussion:  Central cord injury;  Cont b/b but needs assist with urinal.  Supervision goals for OT buy may need some min assist at times.  Showing increased use of LUE.  Supervision goals for PT.  Pt fatigues very easily BUT very motivated.  Revisions to Treatment Plan:  None    Continued Need for Acute Rehabilitation Level of Care: The patient requires daily medical management by a physician with specialized training in physical medicine and rehabilitation for the following conditions: Daily direction of a multidisciplinary physical rehabilitation program to ensure safe treatment while eliciting the highest outcome that is of practical value to the patient.: Yes Daily medical management of patient stability for increased activity during  participation in an intensive rehabilitation regime.: Yes Daily analysis of laboratory values and/or radiology reports with any subsequent need for medication adjustment of medical intervention for : Neurological problems;Post surgical problems  Gregory Hurley 06/13/2018, 4:31 PM

## 2018-06-13 NOTE — Progress Notes (Addendum)
[place] PHYSICAL MEDICINE & REHABILITATION     PROGRESS NOTE    Subjective/Complaints: No new issues. Already up with therapy  ROS: Patient denies fever, rash, sore throat, blurred vision, nausea, vomiting, diarrhea, cough, shortness of breath or chest pain, joint or back pain, headache, or mood change.     Objective:  No results found. Recent Labs    06/10/18 0915 06/13/18 0521  WBC 5.3 5.1  HGB 8.5* 7.9*  HCT 26.6* 24.5*  PLT 223 206   Recent Labs    06/13/18 0521  NA 136  K 4.0  CL 105  GLUCOSE 93  BUN 13  CREATININE 1.19  CALCIUM 8.4*   CBG (last 3)  No results for input(s): GLUCAP in the last 72 hours.  Wt Readings from Last 3 Encounters:  06/07/18 75.8 kg (167 lb)  05/31/18 77.1 kg (170 lb)  07/07/17 77.1 kg (170 lb)     Intake/Output Summary (Last 24 hours) at 06/13/2018 0830 Last data filed at 06/13/2018 0825 Gross per 24 hour  Intake 862 ml  Output 1425 ml  Net -563 ml    Vital Signs: Blood pressure (!) 148/66, pulse (!) 58, temperature 99.8 F (37.7 C), temperature source Oral, resp. rate 18, SpO2 100 %. Physical Exam:  Constitutional: No distress . Vital signs reviewed. HEENT: EOMI, oral membranes moist Neck: supple Cardiovascular: RRR without murmur. No JVD    Respiratory: CTA Bilaterally without wheezes or rales. Normal effort    GI: BS +, non-tender, non-distended  Musculoskeletal: still some sensitivity to touch in his arms and legs as well as trunk, edema RUE 1+  Neurologic Decreased light touch and pain sensation in both arms and legs   Motor right upper extremity is 4 out of 5 right biceps and deltoid, 0 to tr out of 5 elbow flexion, wrist extension, hand intrinsics.  Left upper extremity is 4 out of 5 left deltoid and biceps, 2 out of 5 triceps, 2+ out of 5 finger flexors and wrist extensors as well as hand intrinsics---motor exam stable Lower extremities 3+ to 4- out of 5 proximal distal. Psychiatric:  pleasant  Assessment/Plan: 1.  Functional and mobility deficits secondary to cervical central cord syndrome which require 3+ hours per day of interdisciplinary therapy in a comprehensive inpatient rehab setting. Physiatrist is providing close team supervision and 24 hour management of active medical problems listed below. Physiatrist and rehab team continue to assess barriers to discharge/monitor patient progress toward functional and medical goals.  Function:  Bathing Bathing position   Position: Wheelchair/chair at sink  Bathing parts Body parts bathed by patient: Right arm, Chest, Abdomen, Front perineal area, Right upper leg, Left upper leg Body parts bathed by helper: Left arm, Buttocks, Right lower leg, Left lower leg, Back  Bathing assist Assist Level: (mod A)      Upper Body Dressing/Undressing Upper body dressing   What is the patient wearing?: Pull over shirt/dress     Pull over shirt/dress - Perfomed by patient: Thread/unthread left sleeve, Pull shirt over trunk Pull over shirt/dress - Perfomed by helper: Thread/unthread right sleeve, Put head through opening        Upper body assist Assist Level: (mod A)      Lower Body Dressing/Undressing Lower body dressing   What is the patient wearing?: Pants, Ted Hose, Non-skid slipper socks     Pants- Performed by patient: Thread/unthread left pants leg Pants- Performed by helper: Thread/unthread right pants leg, Pull pants up/down, Fasten/unfasten pants  Non-skid slipper socks- Performed by helper: Don/doff right sock, Don/doff left sock               TED Hose - Performed by helper: Don/doff right TED hose, Don/doff left TED hose  Lower body assist Assist for lower body dressing: (max A)      Toileting Toileting   Toileting steps completed by patient: Adjust clothing prior to toileting Toileting steps completed by helper: Adjust clothing prior to toileting, Performs perineal hygiene, Adjust clothing after  toileting    Toileting assist Assist level: Touching or steadying assistance (Pt.75%)   Transfers Chair/bed transfer   Chair/bed transfer method: Stand pivot Chair/bed transfer assist level: Touching or steadying assistance (Pt > 75%) Chair/bed transfer assistive device: Armrests, Walker Mechanical lift: Chiropractor     Max distance: 100 Assist level: Touching or steadying assistance (Pt > 75%)   Wheelchair          Cognition Comprehension Comprehension assist level: Understands basic 90% of the time/cues < 10% of the time  Expression Expression assist level: Expresses basic needs/ideas: With no assist  Social Interaction Social Interaction assist level: Interacts appropriately 90% of the time - Needs monitoring or encouragement for participation or interaction.  Problem Solving Problem solving assist level: Solves basic 90% of the time/requires cueing < 10% of the time  Memory Memory assist level: Recognizes or recalls 90% of the time/requires cueing < 10% of the time  Medical Problem List and Plan: 1. Tetraparesis, secondary to Central cord syndrome  Continue CIR PT OT  -resting WHO RUE at night 2. DVT Prophylaxis/Anticoagulation: Pharmaceutical:Lovenox 3. Pain Management:Oxycodone prn. Continue Neurontin at bedtime 4. Mood:LCSW to follow for evaluation and support. 5. Neuropsych: This patientiscapable of making decisions on hisown behalf. 6. Skin/Wound Care:routine pressure relief measures. 7. Fluids/Electrolytes/Nutrition:    Good po intake 8. HTN: Poorly controlled at baseline with SBP up to 200's. Monitor BID. Continue norvasc, coreg bid and clonidine   Vitals:   06/12/18 2300 06/13/18 0410  BP: (!) 148/63 (!) 148/66  Pulse: 60 (!) 58  Resp: 18 18  Temp: 99 F (37.2 C) 99.8 F (37.7 C)  SpO2: 100% 100%  showing some improvement 9. Right ear infection?: continue ciprodex bid.  10. Constipation: continue Miralax daily and   suppository daily. Needs bm today 11. PTSD with anxiety/depression/nightmares: Managed with Paxil for mood stabilization and trazodone/melatonin for sleep.  12. Emphysema: On albuterol prn. 13. ABLA: Continue to monitor--most recent H/H--8.5/26.6 on 06/10/2018  -hgb 7.9 6/26  -Iron supplementation  -Check stool guaiac x 1 14. Hyponatremia: Improving from 127-->133-->134 6/21   -up to 136 today 626   LOS (Days) [place] EVALUATION WAS PERFORMED  Meredith Staggers, MD 06/13/2018 8:30 AM

## 2018-06-13 NOTE — Progress Notes (Signed)
Social Work Patient ID: Gregory Hurley, male   DOB: May 13, 1946, 72 y.o.   MRN: [id number]  Have reviewed team conference with pt and daughter who are both aware and agreeable with targeted d/c date of 7/5 and overall supervision goals.  Daughter aware he may need min assist with some ADL activities dependent on functional return on UE.  Plan still for pt to d/c home with daughter.  [name], LCSW

## 2018-06-13 NOTE — Progress Notes (Signed)
Physical Therapy Session Note  Patient Details  Name: Gregory Hurley MRN: [id number] Date of Birth: 24-Oct-1946  Today's Date: 06/13/2018 PT Individual Time: 1415-1530 PT Individual Time Calculation (min): 75 min   Short Term Goals: Week 1:  PT Short Term Goal 1 (Week 1): Pt will transfer with min assist and LRAD PT Short Term Goal 2 (Week 1): Pt will ambulate 100' with LRAD and min assist +1 PT Short Term Goal 3 (Week 1): Pt will negotiate 4 steps with 2 rails and mod assist.  PT Short Term Goal 4 (Week 1): Pt will propel w/c 100' with min assist.   Skilled Therapeutic Interventions/Progress Updates:    Pt reported no pain prior to treatment session this afternoon. Pt amb with RW for 25 ft before requiring seated rest break due to fatigue onset indicating he walked far during his earlier PT session. PT initiated gait training with lite gait and 2 helpers providing manual facilitation of LE's for initiation heel strike and knee flexion during swing phase and increased step length . Pt ambulated 164 feet with x1 seated rest break due to fatigue onset. PT facilitated STS activity with patient retrieving horse shoes placed in front to facilitate adequate trunk flexion to shift weight anteriorly to achieve optimal STS posture with close supervision provided by PT. Pt required multimodal cues to achieve increased trunk flexion maintaining "soft knees" to achieve upright standing posture without locking out knees. Pt performed x10 reps of STS from low surface with successful carry over from previous task with supervision from PT. Pt able to verbalize correct STS sequence requiring occasional cues to scoot forward prior to stand. Pt performed dynamic standing balance activity retrieving close pins and pinning them to basketball hoop placed in front of him working on functional reaching and grip strength with B UE's. Pt used L UE to assist R UE with gripping close pins to perform task successfully. Pt performed  ambulatory transfers with RW and steadying to go from w/c > recliner. Pt was positioned in recliner with soft call bell in reach and all needs met.   Therapy Documentation Precautions:  Precautions Precautions: Fall, Other (comment) Precaution Comments: per previous therapy documention cervical precautions Restrictions Weight Bearing Restrictions: No   See Function Navigator for Current Functional Status.   Therapy/Group: Individual Therapy  Floreen Comber 06/13/2018, 5:02 PM

## 2018-06-13 NOTE — Progress Notes (Signed)
Occupational Therapy Session Note  Patient Details  Name: Gregory Hurley MRN: [id number] Date of Birth: 10-Nov-1946  Today's Date: 06/13/2018 OT Individual Time: 8850-2774 OT Individual Time Calculation (min): 73 min    Short Term Goals: Week 1:  OT Short Term Goal 1 (Week 1): Pt will wash LUE with RUE with MIN A to demonstrate improved coordination/grasp of R hand OT Short Term Goal 2 (Week 1): Pt will complete BSC/toilet transfer wiht MIN A OT Short Term Goal 3 (Week 1): Pt will advance pants past hips wiht min A OT Short Term Goal 4 (Week 1): Pt will groom in standing at sink for 1 grooming items and MIN A for standing balance OT Short Term Goal 5 (Week 1): Pt will thread 1UE into sleeve of shirt with supervision  Skilled Therapeutic Interventions/Progress Updates:    Upon entering the room, pt supine in bed awaiting OT. Pt performed supine >sit from flat bed with min A for trunk. Pt transferred into wheelchair with min A stand pivot transfer with use of RW. OT removed night resting hand splint and donned day brace. OT noted decreased edema compared to yesterday. Pt eating breakfast with set up A to open containers and cut up food. Pt declined bathing and dressing tasks. OT assisted pt to gym via wheelchair. Pt utilized R UE with tan, soft therapeutty in order to increase strengthening and coordination. Pt returned demonstrations with increased time this session. Putty providing pt with visual feedback in regards to effort of each digit. Pt standing for 7 minutes at table top with steady assistance and able to place/pull/and stack resistive pegs with increased time and 3/11 drops . Pt utilizing more gross grasp for technique to accomplish task. Pt returning to wheelchair at this time and assisted back to room. Call bell and all needed items within reach upon exiting the room.   Therapy Documentation Precautions:  Precautions Precautions: Fall, Other (comment) Precaution Comments: per previous  therapy documention cervical precautions Restrictions Weight Bearing Restrictions: No   Pain: Pain Assessment Pain Scale: 0-10 Pain Score: 0-No pain Pain Type: Acute pain;Neuropathic pain Pain Location: Shoulder Pain Orientation: Right Pain Descriptors / Indicators: Aching Pain Onset: Awakened from sleep Patients Stated Pain Goal: 2 Pain Intervention(s): Medication (See eMAR);Repositioned  See Function Navigator for Current Functional Status.   Therapy/Group: Individual Therapy  [name] 06/13/2018, 9:29 AM

## 2018-06-13 NOTE — Care Management (Signed)
Inpatient [facility] Individual Statement of Services  Patient Name:  Gregory Hurley  Date:  06/13/2018  Welcome to the [facility].  Our goal is to provide you with an individualized program based on your diagnosis and situation, designed to meet your specific needs.  With this comprehensive rehabilitation program, you will be expected to participate in at least 3 hours of rehabilitation therapies Monday-Friday, with modified therapy programming on the weekends.  Your rehabilitation program will include the following services:  Physical Therapy (PT), Occupational Therapy (OT), 24 hour per day rehabilitation nursing, Therapeutic Recreaction (TR), Neuropsychology, Case Management (Social Worker), Rehabilitation Medicine, Nutrition Services and Pharmacy Services  Weekly team conferences will be held on Tuesdays to discuss your progress.  Your Social Worker will talk with you frequently to get your input and to update you on team discussions.  Team conferences with you and your family in attendance may also be held.  Expected length of stay: 18-21 days    Overall anticipated outcome: supervision/ light minimal assistance  Depending on your progress and recovery, your program may change. Your Social Worker will coordinate services and will keep you informed of any changes. Your Social Worker's name and contact numbers are listed  below.  The following services may also be recommended but are not provided by the [facility] will be made to provide these services after discharge if needed.  Arrangements include referral to agencies that provide these services.  Your insurance has been verified to be:  [employer] Your primary doctor is:  [facility] [employer] [facility] ([place])  Pertinent information will be shared with your doctor and your insurance  company.  Social Worker:  [place], [place] or ([phone]   Information discussed with and copy given to patient by: Lennart Pall, 06/13/2018, 4:19 PM

## 2018-06-14 ENCOUNTER — Inpatient Hospital Stay (HOSPITAL_COMMUNITY): Payer: Medicare HMO | Admitting: Occupational Therapy

## 2018-06-14 ENCOUNTER — Inpatient Hospital Stay (HOSPITAL_COMMUNITY): Payer: PPO | Admitting: Physical Therapy

## 2018-06-14 MED ORDER — GENERIC EXTERNAL MEDICATION
Status: DC
Start: ? — End: 2018-06-14

## 2018-06-14 NOTE — Progress Notes (Signed)
Physical Therapy Session Note  Patient Details  Name: Gregory Hurley MRN: [id number] Date of Birth: 03/13/1946  Today's Date: 06/14/2018 PT Individual Time: 1010-1125 PT Individual Time Calculation (min): 75 min   Short Term Goals: Week 1:  PT Short Term Goal 1 (Week 1): Pt will transfer with min assist and LRAD PT Short Term Goal 2 (Week 1): Pt will ambulate 100' with LRAD and min assist +1 PT Short Term Goal 3 (Week 1): Pt will negotiate 4 steps with 2 rails and mod assist.  PT Short Term Goal 4 (Week 1): Pt will propel w/c 100' with min assist.   Skilled Therapeutic Interventions/Progress Updates:  Pt reported no pain prior to the start of treatment session. Pt consistently performs stand pivot transfers and STS transitional movements with min A progressing to supervision with VC's for scooting forward in chair, correct sequencing, and performance of adequate trunk flexion to achieve stand successfully. Pt performed x3 trials of [employer]ision working on UE functional reaching in all planes and NMR for R UE. Pt achieved 12 hits in 60 secs reaching target with B UE's; 24 hits using L UE; and 9 hits using only R UE. Pt demo'd inc fatigue when reaching with R UE and inc time to complete task. PT notes cog wheel spasticity in R UE when assessed, limiting Pt ability to functionally reach over head to hit targets. PT further progressed Pt to performing task standing on foam to challenge vestibular and visual system. Pt achieved 24 hits reaching with B UE's with steadying A; PT initiated second trial on foam working on forward and lateral trunk leans cueing patient to maintain feet placement on foam and not utilize stepping to get closer to targets. Pt was able to perform functional reaching with forward and lateral weight shifts successfully s/p cueing. Pt was able to perform task achieving 10 hits in 60 secs with steadying A. Decrease in number of hits achieved due to PT aligning foam pad further back on  second trial to promote increased reaching and weight shifting to reach targets. Pt performed 8 min on Nustep working on NMR and reciprocal UE/LE movements. Pt performed sitting EOB>sidelying with mod A for LE management and shifting pelvis in bed for better alignment in side lying position. Pt was positioned in bed with soft call bell in reach and all needs met.  Therapy Documentation Precautions:  Precautions Precautions: Fall, Other (comment) Precaution Comments: per previous therapy documention cervical precautions Restrictions Weight Bearing Restrictions: No   See Function Navigator for Current Functional Status.   Therapy/Group: Individual Therapy  Cara Poole 06/14/2018, 1:37 PM  

## 2018-06-14 NOTE — Progress Notes (Signed)
Patient claims he only wears his collar  with therapy. Reported to incoming shift .

## 2018-06-14 NOTE — Progress Notes (Signed)
Occupational Therapy Session Note  Patient Details  Name: Gregory Hurley MRN: [id number] Date of Birth: 1946-01-02  Today's Date: 06/14/2018 OT Individual Time: 7741-4239 OT Individual Time Calculation (min): 74 min    Short Term Goals: Week 1:  OT Short Term Goal 1 (Week 1): Pt will wash LUE with RUE with MIN A to demonstrate improved coordination/grasp of R hand OT Short Term Goal 2 (Week 1): Pt will complete BSC/toilet transfer wiht MIN A OT Short Term Goal 3 (Week 1): Pt will advance pants past hips wiht min A OT Short Term Goal 4 (Week 1): Pt will groom in standing at sink for 1 grooming items and MIN A for standing balance OT Short Term Goal 5 (Week 1): Pt will thread 1UE into sleeve of shirt with supervision  Skilled Therapeutic Interventions/Progress Updates:    Upon entering the room, pt seated in recliner chair with RN present setting up food tray. Pt eating meal with built up utensil while therapist discussed goals for today and progress towards long term goals. OT assisted pt outside via wheelchair for energy conservation. Pt overcome with happiness since being outside for the first time this hospital course. Pt verbalizing his own goals for therapy and OT discussed recommendations for outpatient OT at discharge with pt in agreement. Pt ambulating 75' on uneven surface with use of RW and min A for balance. Pt required min cues for forward gaze. Pt returning to wheelchair secondary to fatigue and returned to room at end of session. Pt transferred back into recliner chair with mod A sit>stand and min A stand pivot transfer with RW. Soft call bell placed where pt could reach and chair alarm activated.   Therapy Documentation Precautions:  Precautions Precautions: Fall, Other (comment) Precaution Comments: per previous therapy documention cervical precautions Restrictions Weight Bearing Restrictions: No General:   Vital Signs: Therapy Vitals Pulse Rate: 61 BP: (!) 108/52 Patient  Position (if appropriate): Sitting Oxygen Therapy SpO2: 100 % Pain: Pain Assessment Pain Scale: 0-10 Pain Score: 4  Pain Type: Acute pain;Neuropathic pain Pain Location: Shoulder Pain Orientation: Right Pain Descriptors / Indicators: Throbbing Pain Onset: On-going Patients Stated Pain Goal: 2 Pain Intervention(s): Medication (See eMAR) ADL:   Vision   Perception    Praxis   Exercises:   Other Treatments:    See Function Navigator for Current Functional Status.   Therapy/Group: Individual Therapy  [name] 06/14/2018, 9:38 AM

## 2018-06-14 NOTE — Progress Notes (Signed)
Physical Therapy Session Note  Patient Details  Name: Gregory Hurley MRN: [id number] Date of Birth: Mar 04, 1946  Today's Date: 06/14/2018 PT Individual Time: 1010-1125 PT Individual Time Calculation (min): 75 min   Short Term Goals: Week 1:  PT Short Term Goal 1 (Week 1): Pt will transfer with min assist and LRAD PT Short Term Goal 2 (Week 1): Pt will ambulate 100' with LRAD and min assist +1 PT Short Term Goal 3 (Week 1): Pt will negotiate 4 steps with 2 rails and mod assist.  PT Short Term Goal 4 (Week 1): Pt will propel w/c 100' with min assist.   Skilled Therapeutic Interventions/Progress Updates:    Pt received seated in recliner in room finishing having staples removed by RN, agreeable to PT. No complaints of pain. Sit to stand with min A to RW. Ambulation 3 x 90 ft with RW and min A for balance, focus on RUE grip on RW with gait. Pt reports BUE fatigue quickly with gait and are limiting factors for increasing distance. Sit to stand 3 x 5 reps from progressively lower surface to RW with CGA. Standing 4" step taps with BUE support on RW and min A for balance, focus on RLE control. Seated UE fine motor task placing yellow clothespins on basketball net with focus on use of RUE with assist from LUE. Pt fatigues quickly with UE task. Pt left seated in recliner in room with needs in reach and chair alarm in place.  Therapy Documentation Precautions:  Precautions Precautions: Fall, Other (comment) Precaution Comments: per previous therapy documention cervical precautions Restrictions Weight Bearing Restrictions: No  See Function Navigator for Current Functional Status.   Therapy/Group: Individual Therapy  Excell Seltzer, PT, DPT  06/14/2018, 11:24 AM

## 2018-06-14 NOTE — Progress Notes (Signed)
Staples removed ; steris placed and non adherent dressing  placed for protection not to  rub on patient's collar. Patient tolerated.

## 2018-06-14 NOTE — Plan of Care (Signed)
  Problem: Consults Goal: RH SPINAL CORD INJURY PATIENT EDUCATION Description  See Patient Education module for education specifics. Mod I  Outcome: Progressing Goal: Skin Care Protocol Initiated - if Braden Score 18 or less Description If consults are not indicated, leave blank or document N/A Outcome: Progressing Goal: Nutrition Consult-if indicated Outcome: Progressing   Problem: SCI BOWEL ELIMINATION Goal: RH STG MANAGE BOWEL WITH ASSISTANCE Description STG Manage Bowel with Assistance.min assist  Outcome: Progressing Goal: RH STG SCI MANAGE BOWEL WITH MEDICATION WITH ASSISTANCE Description STG SCI Manage bowel with medication with assistance.min assist  Outcome: Progressing Goal: RH STG SCI MANAGE BOWEL PROGRAM W/ASSIST OR AS APPROPRIATE Description STG SCI Manage bowel program w/ min assist or as appropriate.  Outcome: Progressing   Problem: SCI BLADDER ELIMINATION Goal: RH STG MANAGE BLADDER WITH ASSISTANCE Description STG Manage Bladder With Assistance min assist  Outcome: Progressing   Problem: RH SKIN INTEGRITY Goal: RH STG SKIN FREE OF INFECTION/BREAKDOWN Description Skin is free from infection entire stay on rehab  Outcome: Progressing Goal: RH STG ABLE TO PERFORM INCISION/WOUND CARE W/ASSISTANCE Description STG Able To Perform Incision/Wound Care With  [employer].  Outcome: Progressing   Problem: RH SAFETY Goal: RH STG ADHERE TO SAFETY PRECAUTIONS W/ASSISTANCE/DEVICE Description STG Adhere to Safety Precautions With Assistance/Device.min assist  Outcome: Progressing Goal: RH STG DECREASED RISK OF FALL WITH ASSISTANCE Description STG Decreased Risk of Fall With Assistance. Outcome: Progressing   Problem: RH PAIN MANAGEMENT Goal: RH STG PAIN MANAGED AT OR BELOW PT'S PAIN GOAL Description Zero pain  Outcome: Progressing

## 2018-06-14 NOTE — Progress Notes (Signed)
[place] PHYSICAL MEDICINE & REHABILITATION     PROGRESS NOTE    Subjective/Complaints: No new complaints. Only mild pain in right arm. Swelling in arm still, arm weak.   ROS: Patient denies fever, rash, sore throat, blurred vision, nausea, vomiting, diarrhea, cough, shortness of breath or chest pain, joint or back pain, headache, or mood change.      Objective:  No results found. Recent Labs    06/13/18 0521  WBC 5.1  HGB 7.9*  HCT 24.5*  PLT 206   Recent Labs    06/13/18 0521  NA 136  K 4.0  CL 105  GLUCOSE 93  BUN 13  CREATININE 1.19  CALCIUM 8.4*   CBG (last 3)  No results for input(s): GLUCAP in the last 72 hours.  Wt Readings from Last 3 Encounters:  06/07/18 75.8 kg (167 lb)  06/07/18 75.8 kg (167 lb)  05/31/18 77.1 kg (170 lb)     Intake/Output Summary (Last 24 hours) at 06/14/2018 0904 Last data filed at 06/14/2018 0630 Gross per 24 hour  Intake 530 ml  Output 650 ml  Net -120 ml    Vital Signs: Blood pressure (!) 108/52, pulse 61, temperature 97.8 F (36.6 C), temperature source Oral, resp. rate 18, height 5\' 11"  (1.803 m), weight 75.8 kg (167 lb), SpO2 100 %. Physical Exam:  Constitutional: No distress . Vital signs reviewed. HEENT: EOMI, oral membranes moist Neck: supple Cardiovascular: RRR without murmur. No JVD    Respiratory: CTA Bilaterally without wheezes or rales. Normal effort    GI: BS +, non-tender, non-distended  Musculoskeletal: minimal sensitivity RUE, edema RUE 1+  Neurologic Decreased light touch and pain sensation in both arms and legs   Motor right upper extremity is 4 out of 5 right biceps and deltoid,  tr out of 5 elbow flexion, wrist extension, hand intrinsics.  Left upper extremity is 4 out of 5 left deltoid and biceps, 2 out of 5 triceps, 2+ out of 5 finger flexors and wrist extensors as well as hand intrinsics Lower extremities 3+ to 4- out of 5 proximal distal. Psychiatric: pleasant  Assessment/Plan: 1.   Functional and mobility deficits secondary to cervical central cord syndrome which require 3+ hours per day of interdisciplinary therapy in a comprehensive inpatient rehab setting. Physiatrist is providing close team supervision and 24 hour management of active medical problems listed below. Physiatrist and rehab team continue to assess barriers to discharge/monitor patient progress toward functional and medical goals.  Function:  Bathing Bathing position   Position: Wheelchair/chair at sink  Bathing parts Body parts bathed by patient: Right arm, Chest, Abdomen, Front perineal area, Right upper leg, Left upper leg Body parts bathed by helper: Left arm, Buttocks, Right lower leg, Left lower leg, Back  Bathing assist Assist Level: (mod A)      Upper Body Dressing/Undressing Upper body dressing   What is the patient wearing?: Pull over shirt/dress     Pull over shirt/dress - Perfomed by patient: Thread/unthread left sleeve, Pull shirt over trunk Pull over shirt/dress - Perfomed by helper: Thread/unthread right sleeve, Put head through opening        Upper body assist Assist Level: Touching or steadying assistance(Pt > 75%)      Lower Body Dressing/Undressing Lower body dressing   What is the patient wearing?: Pants, Ted Hose, Non-skid slipper socks     Pants- Performed by patient: Thread/unthread left pants leg Pants- Performed by helper: Thread/unthread right pants leg, Pull pants up/down, Fasten/unfasten  pants   Non-skid slipper socks- Performed by helper: Don/doff right sock, Don/doff left sock               TED Hose - Performed by helper: Don/doff right TED hose, Don/doff left TED hose  Lower body assist Assist for lower body dressing: (max A)      Toileting Toileting Toileting activity did not occur: Safety/medical concerns Toileting steps completed by patient: Adjust clothing prior to toileting Toileting steps completed by helper: Adjust clothing prior to  toileting, Performs perineal hygiene, Adjust clothing after toileting    Toileting assist Assist level: Touching or steadying assistance (Pt.75%)   Transfers Chair/bed transfer Chair/bed transfer activity did not occur: N/A Chair/bed transfer method: Stand pivot Chair/bed transfer assist level: Touching or steadying assistance (Pt > 75%) Chair/bed transfer assistive device: Armrests, Walker Mechanical lift: Chiropractor     Max distance: 164 Assist level: Touching or steadying assistance (Pt > 75%)   Wheelchair          Cognition Comprehension Comprehension assist level: Understands complex 90% of the time/cues 10% of the time  Expression Expression assist level: Expresses basic needs/ideas: With no assist  Social Interaction Social Interaction assist level: Interacts appropriately 90% of the time - Needs monitoring or encouragement for participation or interaction.  Problem Solving Problem solving assist level: Solves basic problems with no assist  Memory Memory assist level: Recognizes or recalls 90% of the time/requires cueing < 10% of the time  Medical Problem List and Plan: 1. Tetraparesis, secondary to Central cord syndrome  Continue CIR PT OT  -resting WHO RUE at night 2. DVT Prophylaxis/Anticoagulation: Pharmaceutical:Lovenox 3. Pain Management:Oxycodone prn. Continue Neurontin at bedtime 4. Mood:LCSW to follow for evaluation and support. 5. Neuropsych: This patientiscapable of making decisions on hisown behalf. 6. Skin/Wound Care:routine pressure relief measures. 7. Fluids/Electrolytes/Nutrition:    Good po intake 8. HTN: Poorly controlled at baseline with SBP up to 200's. Monitor BID. Continue norvasc, coreg bid and clonidine   Vitals:   06/14/18 0823 06/14/18 0825  BP: (!) 108/52 (!) 108/52  Pulse:  61  Resp:    Temp:    SpO2:  100%  controlled 9. Right ear infection?: continue ciprodex bid.  10. Constipation: continue  Miralax daily and  suppository daily. Last bm 6/25, Needs bm today 11. PTSD with anxiety/depression/nightmares: Managed with Paxil for mood stabilization and trazodone/melatonin for sleep.  12. Emphysema: On albuterol prn. 13. ABLA: Continue to monitor--most recent H/H--8.5/26.6 on 06/10/2018  -hgb 7.9 6/26  -Iron supplementation  -Check stool guaiac x 1 (need sample)  -recheck hgb tomorrow 14. Hyponatremia: Improving from 127-->133-->134 6/21   -up to 136  6/26   LOS (Days) 7 A FACE TO FACE EVALUATION WAS PERFORMED  Meredith Staggers, MD 06/14/2018 9:04 AM

## 2018-06-14 NOTE — Plan of Care (Addendum)
  Problem: SCI BOWEL ELIMINATION Goal: RH STG MANAGE BOWEL WITH ASSISTANCE Description STG Manage Bowel with Assistance.min assist  06/14/2018 1112 by Ander Slade, RN Outcome: Not Progressing; patient on bowel program     Problem: SCI BOWEL ELIMINATION Goal: RH STG SCI MANAGE BOWEL WITH MEDICATION WITH ASSISTANCE Description STG SCI Manage bowel with medication with assistance.min assist  06/14/2018 1112 by Ander Slade, RN Outcome: Not Progressing; pt claims normally he goes every 4 days with bowel program and with miralax pt claims he just had small bm's. Linna Hoff PA made aware

## 2018-06-15 ENCOUNTER — Inpatient Hospital Stay (HOSPITAL_COMMUNITY): Payer: PPO | Admitting: Occupational Therapy

## 2018-06-15 ENCOUNTER — Inpatient Hospital Stay (HOSPITAL_COMMUNITY): Payer: PPO | Admitting: Physical Therapy

## 2018-06-15 DIAGNOSIS — Z7409 Other reduced mobility: Secondary | ICD-10-CM

## 2018-06-15 LAB — CBC
HCT: 24.9 % — ABNORMAL LOW (ref 39.0–52.0)
HEMOGLOBIN: 7.9 g/dL — AB (ref 13.0–17.0)
MCH: 27.2 pg (ref 26.0–34.0)
MCHC: 31.7 g/dL (ref 30.0–36.0)
MCV: 85.9 fL (ref 78.0–100.0)
Platelets: 196 10*3/uL (ref 150–400)
RBC: 2.9 MIL/uL — ABNORMAL LOW (ref 4.22–5.81)
RDW: 14.9 % (ref 11.5–15.5)
WBC: 4.8 10*3/uL (ref 4.0–10.5)

## 2018-06-15 NOTE — Progress Notes (Signed)
Physical Therapy Session Note  Patient Details  Name: Gregory Hurley MRN: [id number] Date of Birth: 06-Aug-1946  Today's Date: 06/15/2018 PT Individual Time: 1400-1500 PT Individual Time Calculation (min): 60 min   Short Term Goals: Week 1:  PT Short Term Goal 1 (Week 1): Pt will transfer with min assist and LRAD PT Short Term Goal 2 (Week 1): Pt will ambulate 100' with LRAD and min assist +1 PT Short Term Goal 3 (Week 1): Pt will negotiate 4 steps with 2 rails and mod assist.  PT Short Term Goal 4 (Week 1): Pt will propel w/c 100' with min assist.   Skilled Therapeutic Interventions/Progress Updates:    Pt received supine in bed, agreeable to PT. No complaints of pain. Pt is CGA for bed mobility with use of bedrail. Sit to stand with min A to RW. Ambulation 2 x 150 ft with RW with close SBA to CGA. Sit to stand 4 x 5 reps from progressively lower mat with CGA to SBA, decreased assist needed with increase in repetitions. Seated balance EOM: ball kicks with focus on stopping with ball with alt LE and then kicking it forwards. Standing 4" and 6" step taps with BUE support and CGA, focus on RLE clearance on step. Standing alt L/R cone taps with BUE support on RW and CGA. Cone navigation with RW and close SBA to CGA, pt exhibits good safety with RW use and is able to navigate cones successfully. Assisted pt back to bed at end of session, pt left sidelying in bed with needs in reach, bed alarm in place.  Therapy Documentation Precautions:  Precautions Precautions: Fall, Other (comment) Precaution Comments: per previous therapy documention cervical precautions Restrictions Weight Bearing Restrictions: No  See Function Navigator for Current Functional Status.   Therapy/Group: Individual Therapy  Excell Seltzer, PT, DPT  06/15/2018, 4:10 PM

## 2018-06-15 NOTE — Progress Notes (Signed)
[place] PHYSICAL MEDICINE & REHABILITATION     PROGRESS NOTE    Subjective/Complaints: Up in chair. In good spirits. Very complimentary of rehab team.   ROS: Patient denies fever, rash, sore throat, blurred vision, nausea, vomiting, diarrhea, cough, shortness of breath or chest pain, joint or back pain, headache, or mood change.   Objective:  No results found. Recent Labs    06/13/18 0521 06/15/18 0538  WBC 5.1 4.8  HGB 7.9* 7.9*  HCT 24.5* 24.9*  PLT 206 196   Recent Labs    06/13/18 0521  NA 136  K 4.0  CL 105  GLUCOSE 93  BUN 13  CREATININE 1.19  CALCIUM 8.4*   CBG (last 3)  No results for input(s): GLUCAP in the last 72 hours.  Wt Readings from Last 3 Encounters:  06/07/18 75.8 kg (167 lb)  06/07/18 75.8 kg (167 lb)  05/31/18 77.1 kg (170 lb)     Intake/Output Summary (Last 24 hours) at 06/15/2018 0853 Last data filed at 06/15/2018 0205 Gross per 24 hour  Intake 360 ml  Output 1450 ml  Net -1090 ml    Vital Signs: Blood pressure 100/60, pulse 60, temperature 98.4 F (36.9 C), temperature source Oral, resp. rate 18, height 5\' 11"  (1.803 m), weight 75.8 kg (167 lb), SpO2 100 %. Physical Exam:  Constitutional: No distress . Vital signs reviewed. HEENT: EOMI, oral membranes moist Neck: supple Cardiovascular: RRR without murmur. No JVD    Respiratory: CTA Bilaterally without wheezes or rales. Normal effort    GI: BS +, non-tender, non-distended  Musculoskeletal: minimal sensitivity RUE, trace RUE edema  Neurologic Decreased light touch and pain sensation in both arms and legs   Motor right upper extremity is 4 out of 5 right biceps and deltoid,  Tr to 1- out of 5 elbow flexion, wrist extension, hand intrinsics.  Left upper extremity is 4 out of 5 left deltoid and biceps, 2 out of 5 triceps, 2+ out of 5 finger flexors and wrist extensors as well as hand intrinsics Lower extremities 3+ to 4- out of 5 proximal distal. Psychiatric:  pleasant  Assessment/Plan: 1.  Functional and mobility deficits secondary to cervical central cord syndrome which require 3+ hours per day of interdisciplinary therapy in a comprehensive inpatient rehab setting. Physiatrist is providing close team supervision and 24 hour management of active medical problems listed below. Physiatrist and rehab team continue to assess barriers to discharge/monitor patient progress toward functional and medical goals.  Function:  Bathing Bathing position   Position: Wheelchair/chair at sink  Bathing parts Body parts bathed by patient: Right arm, Chest, Abdomen, Front perineal area, Right upper leg, Left upper leg Body parts bathed by helper: Left arm, Buttocks, Right lower leg, Left lower leg, Back  Bathing assist Assist Level: (mod A)      Upper Body Dressing/Undressing Upper body dressing   What is the patient wearing?: Pull over shirt/dress     Pull over shirt/dress - Perfomed by patient: Thread/unthread left sleeve, Pull shirt over trunk Pull over shirt/dress - Perfomed by helper: Thread/unthread right sleeve, Put head through opening        Upper body assist Assist Level: Touching or steadying assistance(Pt > 75%)      Lower Body Dressing/Undressing Lower body dressing   What is the patient wearing?: Pants, Ted Hose, Non-skid slipper socks     Pants- Performed by patient: Thread/unthread left pants leg Pants- Performed by helper: Thread/unthread right pants leg, Pull pants up/down, Fasten/unfasten  pants   Non-skid slipper socks- Performed by helper: Don/doff right sock, Don/doff left sock               TED Hose - Performed by helper: Don/doff right TED hose, Don/doff left TED hose  Lower body assist Assist for lower body dressing: (max A)      Toileting Toileting Toileting activity did not occur: Safety/medical concerns Toileting steps completed by patient: Adjust clothing prior to toileting Toileting steps completed by helper:  Adjust clothing prior to toileting, Performs perineal hygiene, Adjust clothing after toileting    Toileting assist Assist level: Touching or steadying assistance (Pt.75%)   Transfers Chair/bed transfer Chair/bed transfer activity did not occur: N/A Chair/bed transfer method: Stand pivot Chair/bed transfer assist level: Touching or steadying assistance (Pt > 75%) Chair/bed transfer assistive device: Armrests, Walker Mechanical lift: Maximove   Locomotion Ambulation     Max distance: 90' Assist level: Touching or steadying assistance (Pt > 75%)   Wheelchair          Cognition Comprehension Comprehension assist level: Understands basic 90% of the time/cues < 10% of the time  Expression Expression assist level: Expresses basic 90% of the time/requires cueing < 10% of the time.  Social Interaction Social Interaction assist level: Interacts appropriately 90% of the time - Needs monitoring or encouragement for participation or interaction.  Problem Solving Problem solving assist level: Solves basic 90% of the time/requires cueing < 10% of the time  Memory Memory assist level: Recognizes or recalls 90% of the time/requires cueing < 10% of the time  Medical Problem List and Plan: 1. Tetraparesis, secondary to Central cord syndrome  Continue CIR PT OT  -resting WHO RUE at night 2. DVT Prophylaxis/Anticoagulation: Pharmaceutical:Lovenox 3. Pain Management:Oxycodone prn. Continue Neurontin at bedtime 4. Mood:LCSW to follow for evaluation and support. 5. Neuropsych: This patientiscapable of making decisions on hisown behalf. 6. Skin/Wound Care:routine pressure relief measures. 7. Fluids/Electrolytes/Nutrition:    Excellent PO intake 8. HTN: Poorly controlled at baseline with SBP up to 200's. Monitor BID. Continue norvasc, coreg bid and clonidine   Vitals:   06/15/18 0457 06/15/18 0758  BP: (!) 159/58 100/60  Pulse: 60 60  Resp: 18   Temp: 98.4 F (36.9 C)   SpO2: 100%    controlled 6/28 9. Right ear infection?: continue ciprodex bid.  10. Constipation: continue Miralax daily and  suppository daily. Last bm 6/25, Needs bm today 11. PTSD with anxiety/depression/nightmares: Managed with Paxil for mood stabilization and trazodone/melatonin for sleep.  12. Emphysema: On albuterol prn. 13. ABLA: Continue to monitor-   -hgb 7.9 6/26 and again 7.9 7/28  -Iron supplementation  -Check stool guaiac x 1 (still need sample)  -recheck hgb Monday 14. Hyponatremia: Improving from 127-->133-->134 6/21   -up to 136  6/26   LOS (Days) [place] EVALUATION WAS PERFORMED  Meredith Staggers, MD 06/15/2018 8:53 AM

## 2018-06-15 NOTE — Progress Notes (Signed)
Patient remains on sleep chart; patient slept several hours throughout shift; turned and toileted this shift as often as needed, meds as ordered. No BM this shift; collar on and intact. Will continue to monitor.

## 2018-06-15 NOTE — Progress Notes (Signed)
Physical Therapy Session Note  Patient Details  Name: Gregory Hurley MRN: [id number] Date of Birth: January 26, 1946  Today's Date: 06/15/2018 PT Individual Time: 1000-1100 PT Individual Time Calculation (min): 60 min   Short Term Goals: Week 1:  PT Short Term Goal 1 (Week 1): Pt will transfer with min assist and LRAD PT Short Term Goal 2 (Week 1): Pt will ambulate 100' with LRAD and min assist +1 PT Short Term Goal 3 (Week 1): Pt will negotiate 4 steps with 2 rails and mod assist.  PT Short Term Goal 4 (Week 1): Pt will propel w/c 100' with min assist.   Skilled Therapeutic Interventions/Progress Updates:    Pt received seated in recliner in room, agreeable to PT. No complaints of pain. Sit to stand with min A to RW throughout therapy session. SPT with RW and CGA. Ambulation 2 x 90 ft, 1 x 150 ft with RW and CGA. Manual w/c propulsion x 50 ft with LUE and BLE with SBA before onset of LE fatigue. Ascend/descend 2 x 4 stairs with 2 handrails and min A, step-to gait pattern. Standing balance in // bars on foam: normal stance, Romberg stance, modified tandem stance 3 x 30 sec each with no UE support and close SBA to CGA. Fwd/backward amb in // bars with CGA; sidesteps in // bars with CGA, focus on RLE control. Pt left seated in recliner in room with needs in reach and chair alarm in place.  Therapy Documentation Precautions:  Precautions Precautions: Fall, Other (comment) Precaution Comments: per previous therapy documention cervical precautions Restrictions Weight Bearing Restrictions: No  See Function Navigator for Current Functional Status.   Therapy/Group: Individual Therapy  Excell Seltzer, PT, DPT  06/15/2018, 12:15 PM

## 2018-06-15 NOTE — Progress Notes (Signed)
Occupational Therapy Session Note  Patient Details  Name: Gregory Hurley MRN: [id number] Date of Birth: 1946/07/29  Today's Date: 06/15/2018 OT Individual Time: 1275-1700 OT Individual Time Calculation (min): 71 min   Short Term Goals: Week 1:  OT Short Term Goal 1 (Week 1): Pt will wash LUE with RUE with MIN Gregory to demonstrate improved coordination/grasp of R hand OT Short Term Goal 2 (Week 1): Pt will complete BSC/toilet transfer wiht MIN Gregory OT Short Term Goal 3 (Week 1): Pt will advance pants past hips wiht min Gregory OT Short Term Goal 4 (Week 1): Pt will groom in standing at sink for 1 grooming items and MIN Gregory for standing balance OT Short Term Goal 5 (Week 1): Pt will thread 1UE into sleeve of shirt with supervision  Skilled Therapeutic Interventions/Progress Updates:    Pt greeted in recliner with RN present to administer medicine. He requested to go outdoors. After completing toilet transfer/toileting at ambulatory level with steady assist using RW, he was escorted outside. Pt ambulated with RW and steady assist over uneven terrain with min manual cues to keep Rt hand grasp on device. Able to complete 1 lap around fountain without rest! For remainder of session, worked on standing balance, sensation normalization, and UE NMR via card game activity. Used adaptive card holder while pt maintained R UE grasp on RW for weightbearing. Pt unable to grasp individual cards with Lt due to sensation/pincer strength deficits, and therefore retrieved small piles from deck. Longest standing window without rest 6 minutes. Afterwards pt was escorted back to room. He ambulated back to recliner using RW with steady assist. He was left in recliner with all needs within reach.   Mod Gregory all sit<stands with min vcs for technique during session. Able to ambulate/maintain standing balance with steady assist.  Therapy Documentation Precautions:  Precautions Precautions: Fall, Other (comment) Precaution Comments: per  previous therapy documention cervical precautions Restrictions Weight Bearing Restrictions: No Pain: Min c/o back pain when standing. During rest breaks, guided pt through gentle yoga based stretches to help. "This is great. I'll do these in bed."  Pain Assessment Pain Scale: 0-10 Pain Score: 0-No pain ADL:      See Function Navigator for Current Functional Status.   Therapy/Group: Individual Therapy  Gregory Hurley Gregory Hurley 06/15/2018, 12:19 PM

## 2018-06-16 ENCOUNTER — Inpatient Hospital Stay (HOSPITAL_COMMUNITY): Payer: PPO | Admitting: Physical Therapy

## 2018-06-16 ENCOUNTER — Inpatient Hospital Stay (HOSPITAL_COMMUNITY): Payer: PPO | Admitting: Occupational Therapy

## 2018-06-16 DIAGNOSIS — R0989 Other specified symptoms and signs involving the circulatory and respiratory systems: Secondary | ICD-10-CM

## 2018-06-16 DIAGNOSIS — D62 Acute posthemorrhagic anemia: Secondary | ICD-10-CM

## 2018-06-16 DIAGNOSIS — K5903 Drug induced constipation: Secondary | ICD-10-CM

## 2018-06-16 DIAGNOSIS — I169 Hypertensive crisis, unspecified: Secondary | ICD-10-CM

## 2018-06-16 NOTE — Plan of Care (Signed)
  Problem: Consults Goal: RH SPINAL CORD INJURY PATIENT EDUCATION Description  See Patient Education module for education specifics. Mod I  Outcome: Progressing Goal: Skin Care Protocol Initiated - if Braden Score 18 or less Description If consults are not indicated, leave blank or document N/A Outcome: Progressing   Problem: SCI BOWEL ELIMINATION Goal: RH STG SCI MANAGE BOWEL PROGRAM W/ASSIST OR AS APPROPRIATE Description STG SCI Manage bowel program w/ min assist or as appropriate.  Outcome: Progressing   Problem: SCI BLADDER ELIMINATION Goal: RH STG MANAGE BLADDER WITH ASSISTANCE Description STG Manage Bladder With Assistance min assist  Outcome: Progressing   Problem: RH SKIN INTEGRITY Goal: RH STG SKIN FREE OF INFECTION/BREAKDOWN Description Skin is free from infection entire stay on rehab  Outcome: Progressing Goal: RH STG ABLE TO PERFORM INCISION/WOUND CARE W/ASSISTANCE Description STG Able To Perform Incision/Wound Care With  Max Assistance.   Outcome: Progressing   Problem: RH SAFETY Goal: RH STG ADHERE TO SAFETY PRECAUTIONS W/ASSISTANCE/DEVICE Description STG Adhere to Safety Precautions With Assistance/Device.min assist  Outcome: Progressing Goal: RH STG DECREASED RISK OF FALL WITH ASSISTANCE Description STG Decreased Risk of Fall With Assistance. Outcome: Progressing   Problem: RH PAIN MANAGEMENT Goal: RH STG PAIN MANAGED AT OR BELOW PT'S PAIN GOAL Description <3/10 pain   Outcome: Progressing   Problem: SCI BOWEL ELIMINATION Goal: RH STG MANAGE BOWEL WITH ASSISTANCE Description STG Manage Bowel with Assistance.min assist  Outcome: Not Progressing Goal: RH STG SCI MANAGE BOWEL WITH MEDICATION WITH ASSISTANCE Description STG SCI Manage bowel with medication with assistance.min assist  Outcome: Not Progressing

## 2018-06-16 NOTE — Progress Notes (Signed)
Occupational Therapy Session Note  Patient Details  Name: Guerino Caporale MRN: [id number] Date of Birth: 1946/01/25  Today's Date: 06/16/2018 OT Group Time: 1100-1200 OT Group Time Calculation (min): 60 min  Skilled Therapeutic Interventions/Progress Updates:    Pt participated in therapeutic w/c level dance group with focus on UE/LE strengthening, activity tolerance, and social participation for carryover during self care tasks. Pt was guided through various dance-based exercises involving UB/LB and trunk. Modifications made to emphasize Rt NMR. Pt frequently incorporated simultaneous UE/LE involvement for increased challenge. He opted to remain seated vs standing as he wanted to focus on R UE. Pt actively requesting songs, engaging with other members, smiling, and participating. At end of session RT escorted pt back to room.    Therapy Documentation Precautions:  Precautions Precautions: Fall, Other (comment) Precaution Comments: per previous therapy documention cervical precautions Restrictions Weight Bearing Restrictions: No Vital Signs: Therapy Vitals Pulse Rate: (!) 55 BP: (!) 104/49 Patient Position (if appropriate): Sitting Oxygen Therapy SpO2: 99 % O2 Device: Room Air Pain: Pain Assessment Pain Scale: 0-10 Pain Score: 4  Pain Type: Acute pain Pain Location: Ear Pain Orientation: Right Pain Descriptors / Indicators: Aching Pain Frequency: Intermittent Pain Onset: On-going Patients Stated Pain Goal: 0 Pain Intervention(s): Medication (See eMAR)(reordered ear gtts) ADL:      See Function Navigator for Current Functional Status.   Therapy/Group: Group Therapy  Gagan Dillion A Nahomi Hegner 06/16/2018, 12:46 PM

## 2018-06-16 NOTE — Progress Notes (Addendum)
[place] PHYSICAL MEDICINE & REHABILITATION     PROGRESS NOTE    Subjective/Complaints: Patient seen lying in bed this morning.  He slept fairly poor sleep chart.  Patient states he slept well overnight but has ear pain due to an ear infection prior to admission.  ROS: + Ear pain. Denies CP, S OB, nausea, vomiting, diarrhea.  Objective:  No results found. Recent Labs    06/15/18 0538  WBC 4.8  HGB 7.9*  HCT 24.9*  PLT 196   No results for input(s): NA, K, CL, GLUCOSE, BUN, CREATININE, CALCIUM in the last 72 hours.  Invalid input(s): CO CBG (last 3)  No results for input(s): GLUCAP in the last 72 hours.  Wt Readings from Last 3 Encounters:  06/07/18 75.8 kg (167 lb)  06/07/18 75.8 kg (167 lb)  05/31/18 77.1 kg (170 lb)     Intake/Output Summary (Last 24 hours) at 06/16/2018 1633 Last data filed at 06/16/2018 1300 Gross per 24 hour  Intake 840 ml  Output 1600 ml  Net -760 ml    Vital Signs: Blood pressure (!) 135/51, pulse 70, temperature 98.5 F (36.9 C), temperature source Oral, resp. rate 20, height 5\' 11"  (1.803 m), weight 75.8 kg (167 lb), SpO2 99 %. Physical Exam:  Constitutional: No distress . Vital signs reviewed. HENT: Normocephalic.  Atraumatic.   Neck: + Soft collar Eyes: EOMI. No discharge. Cardiovascular: RRR. No JVD. Respiratory: CTA Bilaterally. Normal effort. GI: BS +. Non-distended. Musc: No edema or tenderness in extremities. Neurologic Alert.   Motor: right upper extremity is shoulder abduction 4-/5, elbow flexion 3-/5, elbow extension, wrist extension, handgrip 2/5 Left upper extremity is 4/5 left deltoid and biceps, 2/5 triceps, finger flexors, wrist extensors, hand intrinsics Bilateral lower extremities 4-/5 proximal distal. Psychiatric: pleasant  Assessment/Plan: 1.  Functional and mobility deficits secondary to cervical central cord syndrome which require 3+ hours per day of interdisciplinary therapy in a comprehensive inpatient  rehab setting. Physiatrist is providing close team supervision and 24 hour management of active medical problems listed below. Physiatrist and rehab team continue to assess barriers to discharge/monitor patient progress toward functional and medical goals.  Function:  Bathing Bathing position   Position: Wheelchair/chair at sink  Bathing parts Body parts bathed by patient: Right arm, Chest, Abdomen, Front perineal area, Right upper leg, Left upper leg Body parts bathed by helper: Left arm, Buttocks, Right lower leg, Left lower leg, Back  Bathing assist Assist Level: (mod A)      Upper Body Dressing/Undressing Upper body dressing   What is the patient wearing?: Pull over shirt/dress     Pull over shirt/dress - Perfomed by patient: Thread/unthread left sleeve, Pull shirt over trunk Pull over shirt/dress - Perfomed by helper: Thread/unthread right sleeve, Put head through opening        Upper body assist Assist Level: Touching or steadying assistance(Pt > 75%)      Lower Body Dressing/Undressing Lower body dressing   What is the patient wearing?: Pants     Pants- Performed by patient: Thread/unthread left pants leg Pants- Performed by helper: Pull pants up/down   Non-skid slipper socks- Performed by helper: Don/doff right sock, Don/doff left sock               TED Hose - Performed by helper: Don/doff right TED hose, Don/doff left TED hose  Lower body assist Assist for lower body dressing: Touching or steadying assistance (Pt > 75%)      Toileting Toileting Toileting activity  did not occur: Safety/medical concerns Toileting steps completed by patient: Adjust clothing prior to toileting Toileting steps completed by helper: Adjust clothing prior to toileting, Performs perineal hygiene, Adjust clothing after toileting    Toileting assist Assist level: Touching or steadying assistance (Pt.75%)   Transfers Chair/bed transfer Chair/bed transfer activity did not occur:  N/A Chair/bed transfer method: Stand pivot Chair/bed transfer assist level: Touching or steadying assistance (Pt > 75%) Chair/bed transfer assistive device: Armrests, Walker Mechanical lift: Maximove   Locomotion Ambulation     Max distance: 150' Assist level: Supervision or verbal cues   Wheelchair   Type: Manual Max wheelchair distance: 50' Assist Level: Supervision or verbal cues  Cognition Comprehension Comprehension assist level: Follows basic conversation/direction with no assist  Expression Expression assist level: Expresses basic needs/ideas: With no assist  Social Interaction Social Interaction assist level: Interacts appropriately with others with medication or extra time (anti-anxiety, antidepressant).  Problem Solving Problem solving assist level: Solves basic problems with no assist  Memory Memory assist level: Recognizes or recalls 90% of the time/requires cueing < 10% of the time  Medical Problem List and Plan: 1. Tetraparesis, secondary to Central cord syndrome  Continue CIR  -resting WHO RUE at night  Notes reviewed, images reviewed -CT head unremarkable for acute intracranial process, labs reviewed 2. DVT Prophylaxis/Anticoagulation: Pharmaceutical:Lovenox 3. Pain Management:Oxycodone prn. Continue Neurontin at bedtime 4. Mood:LCSW to follow for evaluation and support. 5. Neuropsych: This patientiscapable of making decisions on hisown behalf. 6. Skin/Wound Care:routine pressure relief measures. 7. Fluids/Electrolytes/Nutrition:  8. HTN: Poorly controlled at baseline with SBP up to 200's. Monitor BID. Continue norvasc, coreg bid and clonidine   Vitals:   06/16/18 0924 06/16/18 1513  BP: (!) 104/49 (!) 135/51  Pulse: (!) 55 70  Resp:  20  Temp:    SpO2: 99% 99%   Labile on 6/29 9. Right ear infection?:   Diagnosed PTA per patient  Continue ciprodex .  10. Constipation: continue Miralax daily and  suppository daily.  11. PTSD with  anxiety/depression/nightmares: Managed with Paxil for mood stabilization and trazodone/melatonin for sleep.  12. Emphysema: On albuterol prn. 13. ABLA: Continue to monitor-   -hgb 7.9 on 6/28  -Iron supplementation  -Check stool guaiac x 1 (still need sample)  -recheck hgb Monday 14. Hyponatremia: Resolved  Sodium 136 on 6/26  LOS (Days) 9 A FACE TO FACE EVALUATION WAS PERFORMED  Kseniya Grunden Lorie Phenix, MD 06/16/2018 4:33 PM

## 2018-06-16 NOTE — Progress Notes (Signed)
Physical Therapy Weekly Progress Note  Patient Details  Name: Render Sossamon MRN: [id number] Date of Birth: 12/29/1945  Beginning of progress report period: June 08, 2018 End of progress report period: June 16, 2018  Today's Date: 06/16/2018 PT Individual Time: 0800-0845 PT Individual Time Calculation (min): 45 min   Patient has met 3 of 4 short term goals. Pt has not met STG for w/c mobility but will likely not require a w/c at d/c for mobility as he is ambulatory.  Patient continues to demonstrate the following deficits muscle weakness and decreased coordination and therefore will continue to benefit from skilled PT intervention to increase functional independence with mobility.  Patient progressing toward long term goals..  Continue plan of care.  PT Short Term Goals Week 1:  PT Short Term Goal 1 (Week 1): Pt will transfer with min assist and LRAD PT Short Term Goal 1 - Progress (Week 1): Met PT Short Term Goal 2 (Week 1): Pt will ambulate 100' with LRAD and min assist +1 PT Short Term Goal 2 - Progress (Week 1): Met PT Short Term Goal 3 (Week 1): Pt will negotiate 4 steps with 2 rails and mod assist.  PT Short Term Goal 3 - Progress (Week 1): Met PT Short Term Goal 4 (Week 1): Pt will propel w/c 100' with min assist.  PT Short Term Goal 4 - Progress (Week 1): Not met(Pt is able to propel w/c x 50 ft before onset of fatigue) Week 2:  PT Short Term Goal 1 (Week 2): = LTG due to ELOS  Skilled Therapeutic Interventions/Progress Updates:  Pt received seated in recliner in room, agreeable to PT. No complaints of pain. Pt performs sit to stand with min A progressing to SBA to RW throughout therapy session as his legs warm up. Ambulation 3 x 100 ft with RW and SBA to CGA. Ascend/descend 12 stairs with 2 handrails and min A, step-to gait pattern. Ambulation with RW through obstacle course: cone weaving and object step-overs with CGA. Standing alternating L/R cone taps with BUE support and CGA.  Pt left seated in recliner in room with needs in reach, chair alarm in place at end of session.  Therapy Documentation Precautions:  Precautions Precautions: Fall, Other (comment) Precaution Comments: per previous therapy documention cervical precautions Restrictions Weight Bearing Restrictions: No  See Function Navigator for Current Functional Status.  Therapy/Group: Individual Therapy  Taylor Turkalo, PT, DPT  06/16/2018, 9:44 AM  

## 2018-06-17 ENCOUNTER — Inpatient Hospital Stay (HOSPITAL_COMMUNITY): Payer: PPO | Admitting: Occupational Therapy

## 2018-06-17 DIAGNOSIS — I169 Hypertensive crisis, unspecified: Secondary | ICD-10-CM

## 2018-06-17 LAB — OCCULT BLOOD X 1 CARD TO LAB, STOOL: FECAL OCCULT BLD: POSITIVE — AB

## 2018-06-17 MED ORDER — HYDRALAZINE HCL 25 MG PO TABS
25.0000 mg | ORAL_TABLET | Freq: Three times a day (TID) | ORAL | Status: DC
  Administered 2018-06-17 – 2018-06-22 (×16): 25 mg via ORAL
  Filled 2018-06-17 (×16): qty 1

## 2018-06-17 NOTE — Progress Notes (Signed)
Occupational Therapy Weekly Progress Note  Patient Details  Name: Gregory Hurley MRN: [id number] Date of Birth: 06/18/1946  Beginning of progress report period:06/08/18  End of progress report period: 06/17/18  Today's Date: 06/17/2018 OT Individual Time: 7096-2836 OT Individual Time Calculation (min): 55 min   Patient has met 5 of 5 short term goals.   Pt has made functional progress at time of report. At time of evaluation, he required Max-Total A for self care tasks due to UE paresis, and now he can complete these tasks with Min A using adaptive strategies and AE. He completes functional transfers at ambulatory level with Min-Mod A, depending on height of transfer surface. At time of eval, he was completing functional stand pivot transfers with Mod A. Pt is continuing to gain functional movement in both UEs, with his Lt stronger than Rt at this time. Due to persisting UE strength/coordination deficits, he still requires assist for donning footwear and anticipate he will need this assist at d/c. Therefore dressing goals have been downgraded to [place]. Toileting has also been downgraded. He will have this support at home. During next report period, tx focus will be on d/c planning and LTG achievement for safe transition home with dtr.   Patient continues to demonstrate the following deficits: muscle weakness and muscle paralysis, decreased cardiorespiratoy endurance, abnormal tone, unbalanced muscle activation and decreased coordination and decreased standing balance and decreased balance strategies and therefore will continue to benefit from skilled OT intervention to enhance overall performance with BADL.  Patient not progressing toward long term goals.  See goal revision..  Continue plan of care.  OT Short Term Goals Week 1:  OT Short Term Goal 1 (Week 1): Pt will wash LUE with RUE with MIN A to demonstrate improved coordination/grasp of R hand OT Short Term Goal 1 - Progress (Week 1): Met OT  Short Term Goal 2 (Week 1): Pt will complete BSC/toilet transfer wiht MIN A OT Short Term Goal 2 - Progress (Week 1): Met OT Short Term Goal 3 (Week 1): Pt will advance pants past hips wiht min A OT Short Term Goal 3 - Progress (Week 1): Met OT Short Term Goal 4 (Week 1): Pt will groom in standing at sink for 1 grooming items and MIN A for standing balance OT Short Term Goal 4 - Progress (Week 1): Met OT Short Term Goal 5 (Week 1): Pt will thread 1UE into sleeve of shirt with supervision OT Short Term Goal 5 - Progress (Week 1): Met Week 2:  OT Short Term Goal 1 (Week 2): STGs=LTGs due to ELOS  Skilled Therapeutic Interventions/Progress Updates:    Pt greeted in recliner. Amenable to shower. Tx focus on UE NMR, functional transfers, and sit<stands during BADL routine. All functional transfers completed using RW at ambulatory level with steady assist (Mod A sit<stand from recliner initially, Min A from TTB and w/c later on). He bathed at sit<stand level from TTB once neck incision was covered. Used bath mitt for R UE. He was able to use the Lt at dominant level with minimal wash cloth dropping. Dressing completed in w/c afterwards sit<stand at sink. Worked on increasing independence with footwear by teaching him 1 handed technique with LEs propped up on bed. He still lacks the hand coordination/strength to don footwear via figure 4 or with this adaptive method, and therefore required assist. With increased time, he was able to thread LEs into LB garments and pull them up in standing with supervision. At end  of session pt ambulated short distance back to recliner with RW and was left with all needs within reach.   Therapy Documentation Precautions:  Precautions Precautions: Fall, Other (comment) Precaution Comments: per previous therapy documention cervical precautions Restrictions Weight Bearing Restrictions: No Pain: Pain Assessment Pain Score: 2  ADL:      See Function Navigator for  Current Functional Status.   Therapy/Group: Individual Therapy  Gregory Hurley 06/17/2018, 12:41 PM

## 2018-06-17 NOTE — Progress Notes (Signed)
[facility] PHYSICAL MEDICINE & REHABILITATION     PROGRESS NOTE    Subjective/Complaints: Patient seen sitting up in his chair this morning. He states he got tired of lying in bed. He states he slept well overnight. He states his ear feels better. He is looking forward to relative rest day today.  ROS: denies CP, S OB, nausea, vomiting, diarrhea.  Objective:  No results found. Recent Labs    06/15/18 0538  WBC 4.8  HGB 7.9*  HCT 24.9*  PLT 196   No results for input(s): NA, K, CL, GLUCOSE, BUN, CREATININE, CALCIUM in the last 72 hours.  Invalid input(s): CO CBG (last 3)  No results for input(s): GLUCAP in the last 72 hours.  Wt Readings from Last 3 Encounters:  06/07/18 75.8 kg (167 lb)  06/07/18 75.8 kg (167 lb)  05/31/18 77.1 kg (170 lb)     Intake/Output Summary (Last 24 hours) at 06/17/2018 0759 Last data filed at 06/17/2018 0539 Gross per 24 hour  Intake 720 ml  Output 1000 ml  Net -280 ml    Vital Signs: Blood pressure (!) 150/69, pulse 65, temperature 98.4 F (36.9 C), temperature source Oral, resp. rate 16, height 5\' 11"  (1.803 m), weight 75.8 kg (167 lb), SpO2 99 %. Physical Exam:  Constitutional: No distress . Vital signs reviewed. HENT: Normocephalic.  Atraumatic.   Neck: + Soft collar Eyes: EOMI. No discharge. Cardiovascular: RRR. No JVD. Respiratory: CTA Bilaterally. Normal effort. GI: BS +. Non-distended. Musc: No edema or tenderness in extremities. Neurologic Alert.   Motor: right upper extremity is shoulder abduction 4-/5, elbow flexion 3-/5, elbow extension, wrist extension, handgrip 2/5 (unchanged) Left upper extremity is 4/5 left deltoid and biceps, 2/5 triceps, finger flexors, wrist extensors, hand intrinsics Bilateral lower extremities 4-/5 proximal distal. Psychiatric: pleasant. Normal mood. Normal behavior.  Assessment/Plan: 1.  Functional and mobility deficits secondary to cervical central cord syndrome which require 3+ hours per  day of interdisciplinary therapy in a comprehensive inpatient rehab setting. Physiatrist is providing close team supervision and 24 hour management of active medical problems listed below. Physiatrist and rehab team continue to assess barriers to discharge/monitor patient progress toward functional and medical goals.  Function:  Bathing Bathing position   Position: Wheelchair/chair at sink  Bathing parts Body parts bathed by patient: Right arm, Chest, Abdomen, Front perineal area, Right upper leg, Left upper leg Body parts bathed by helper: Left arm, Buttocks, Right lower leg, Left lower leg, Back  Bathing assist Assist Level: (mod A)      Upper Body Dressing/Undressing Upper body dressing   What is the patient wearing?: Pull over shirt/dress     Pull over shirt/dress - Perfomed by patient: Thread/unthread left sleeve, Pull shirt over trunk Pull over shirt/dress - Perfomed by helper: Thread/unthread right sleeve, Put head through opening        Upper body assist Assist Level: Touching or steadying assistance(Pt > 75%)      Lower Body Dressing/Undressing Lower body dressing   What is the patient wearing?: Pants     Pants- Performed by patient: Thread/unthread left pants leg Pants- Performed by helper: Pull pants up/down   Non-skid slipper socks- Performed by helper: Don/doff right sock, Don/doff left sock               TED Hose - Performed by helper: Don/doff right TED hose, Don/doff left TED hose  Lower body assist Assist for lower body dressing: Touching or steadying assistance (Pt > 75%)  Toileting Toileting Toileting activity did not occur: Safety/medical concerns Toileting steps completed by patient: Adjust clothing prior to toileting Toileting steps completed by helper: Adjust clothing prior to toileting, Performs perineal hygiene, Adjust clothing after toileting    Toileting assist Assist level: Touching or steadying assistance (Pt.75%)    Transfers Chair/bed transfer Chair/bed transfer activity did not occur: N/A Chair/bed transfer method: Stand pivot Chair/bed transfer assist level: Touching or steadying assistance (Pt > 75%) Chair/bed transfer assistive device: Armrests, Walker Mechanical lift: Maximove   Locomotion Ambulation     Max distance: 150' Assist level: Supervision or verbal cues   Wheelchair   Type: Manual Max wheelchair distance: 50' Assist Level: Supervision or verbal cues  Cognition Comprehension Comprehension assist level: Follows basic conversation/direction with no assist  Expression Expression assist level: Expresses basic needs/ideas: With no assist  Social Interaction Social Interaction assist level: Interacts appropriately with others with medication or extra time (anti-anxiety, antidepressant).  Problem Solving Problem solving assist level: Solves basic problems with no assist  Memory Memory assist level: Recognizes or recalls 90% of the time/requires cueing < 10% of the time  Medical Problem List and Plan: 1. Tetraparesis, secondary to Central cord syndrome  Continue CIR  Resting WHO RUE at night 2. DVT Prophylaxis/Anticoagulation: Pharmaceutical:Lovenox 3. Pain Management:Oxycodone prn. Continue Neurontin at bedtime 4. Mood:LCSW to follow for evaluation and support. 5. Neuropsych: This patientiscapable of making decisions on hisown behalf. 6. Skin/Wound Care:routine pressure relief measures. 7. Fluids/Electrolytes/Nutrition:  8. HTN: Poorly controlled at baseline with SBP up to 200's. Monitor BID. Continue norvasc, coreg bid and clonidine  Hydralazine 25 3 times a day started on 6/30   Vitals:   06/16/18 2016 06/17/18 0537  BP: (!) 181/64 (!) 150/69  Pulse: 62 65  Resp: 18 16  Temp: 98.2 F (36.8 C) 98.4 F (36.9 C)  SpO2: 100% 99%   Labile with hypertensive crisis overnight 9. Right ear infection?:   Diagnosed PTA per patient  Continue ciprodex .  10.  Constipation: continue Miralax daily and  suppository daily.  11. PTSD with anxiety/depression/nightmares: Managed with Paxil for mood stabilization and trazodone/melatonin for sleep.  12. Emphysema: On albuterol prn. 13. ABLA: Continue to monitor-   -hgb 7.9 on 6/28  -Iron supplementation  -Hemoccult positive  -recheck hgb Tomorrow 14. Hyponatremia: Resolved  Sodium 136 on 6/26  LOS (Days) 10 A FACE TO FACE EVALUATION WAS PERFORMED  Ankit Lorie Phenix, MD 06/17/2018 7:59 AM

## 2018-06-17 NOTE — Plan of Care (Signed)
Goals downgraded due to slow progress 06/17/18

## 2018-06-18 ENCOUNTER — Inpatient Hospital Stay (HOSPITAL_COMMUNITY): Payer: PPO | Admitting: Occupational Therapy

## 2018-06-18 ENCOUNTER — Inpatient Hospital Stay (HOSPITAL_COMMUNITY): Payer: PPO

## 2018-06-18 ENCOUNTER — Inpatient Hospital Stay (HOSPITAL_COMMUNITY): Payer: Medicare HMO | Admitting: Occupational Therapy

## 2018-06-18 DIAGNOSIS — S14129S Central cord syndrome at unspecified level of cervical spinal cord, sequela: Secondary | ICD-10-CM

## 2018-06-18 DIAGNOSIS — Z736 Limitation of activities due to disability: Secondary | ICD-10-CM

## 2018-06-18 LAB — BASIC METABOLIC PANEL
ANION GAP: 5 (ref 5–15)
BUN: 13 mg/dL (ref 8–23)
CALCIUM: 8.3 mg/dL — AB (ref 8.9–10.3)
CO2: 25 mmol/L (ref 22–32)
Chloride: 107 mmol/L (ref 98–111)
Creatinine, Ser: 1.16 mg/dL (ref 0.61–1.24)
GLUCOSE: 91 mg/dL (ref 70–99)
Potassium: 3.9 mmol/L (ref 3.5–5.1)
Sodium: 137 mmol/L (ref 135–145)

## 2018-06-18 LAB — CBC
HCT: 25.9 % — ABNORMAL LOW (ref 39.0–52.0)
Hemoglobin: 8.1 g/dL — ABNORMAL LOW (ref 13.0–17.0)
MCH: 27.2 pg (ref 26.0–34.0)
MCHC: 31.3 g/dL (ref 30.0–36.0)
MCV: 86.9 fL (ref 78.0–100.0)
PLATELETS: 181 10*3/uL (ref 150–400)
RBC: 2.98 MIL/uL — ABNORMAL LOW (ref 4.22–5.81)
RDW: 15.1 % (ref 11.5–15.5)
WBC: 3.9 10*3/uL — AB (ref 4.0–10.5)

## 2018-06-18 NOTE — Progress Notes (Signed)
Occupational Therapy Session Note  Patient Details  Name: Gregory Hurley MRN: [id number] Date of Birth: February 23, 1946  Today's Date: 06/18/2018 OT Individual Time: 1435-1530 OT Individual Time Calculation (min): 55 min   Short Term Goals: Week 2:  OT Short Term Goal 1 (Week 2): STGs=LTGs due to ELOS  Skilled Therapeutic Interventions/Progress Updates:    Pt greeted supine in bed. Requesting to use the restroom. Mod A sit<stand from recliner, and steady assist ambulation with RW to toilet. He was able to complete clothing mgt x2 and power up from low toilet with steady assist and bilateral UE support. Once in w/c, pt was escorted to dayroom. For remainder of session worked on [place] (per pt request). Had pt unstack, flip, and restack cones to focus on grasp/release and supination/pronation abilities. He did well by draping hand over cones to move them. Activity was upgraded to complete with larger, unweighted cones. With repetition and visual feedback, he was able to meet task demands without L UE assist, and also flip cones over in one motion with inclusion of 1-2 digits! With repetition and vcs, he also improved shoulder hiking tendencies. At end of tx he was returned to room and ambulated to recliner with RW and steady assist (Mod A for sit<stand). He was left with all needs at session exit.    Therapy Documentation Precautions:  Precautions Precautions: Fall, Other (comment) Precaution Comments: per previous therapy documention cervical precautions Restrictions Weight Bearing Restrictions: No Vital Signs: Therapy Vitals Temp: 97.9 F (36.6 C) Temp Source: Oral Pulse Rate: (!) 57 Resp: 16 BP: (!) 152/62 Patient Position (if appropriate): Lying Oxygen Therapy SpO2: 100 % O2 Device: Room Air Pain: No c/o pain during session    ADL:   :    See Function Navigator for Current Functional Status.   Therapy/Group: Individual Therapy  Gaelen Brager A Rickey Farrier 06/18/2018, 3:55 PM

## 2018-06-18 NOTE — Progress Notes (Signed)
Occupational Therapy Session Note  Patient Details  Name: Gregory Hurley MRN: [id number] Date of Birth: August 22, 1946  Today's Date: 06/18/2018 OT Individual Time: 0845-1000 OT Individual Time Calculation (min): 75 min    Short Term Goals: Week 2:  OT Short Term Goal 1 (Week 2): STGs=LTGs due to ELOS  Skilled Therapeutic Interventions/Progress Updates:    Pt seen for OT session focusing on functional use of R UE and neuro re-ed with table top activity. Pt sitting up in w/c upon arrival, agreeable to tx session. He voiced some discomfort in neck, however reports getting pain meds prior to therapist arrival. Pt desiring to work on R UE this session, declining need for bathing/dressing needs.  He required mod A to stand from low recliner and ambulated to therapy gym with guarding assist using RW.  Completed various gross/grasp activities from table top level during session, demonstrating ability to functionally grasp plastic cups and medium size pegs with increased time and concentration. Pt displays and voices difficulties with proprioceptual tasks, unable to determine amount of pressure being applied to plastic cups. Fine motor skills addressed using small animal figurines. Pt reports relying on vision as compensatory strategy due to decreased feeling/sensation of items in R hand.  Pt without ability to complete in-hand manipulation skills for transition/translation activity.  Pt ambulated back to room in same manner as described abve, left seated in recliner with all needs in reach and bed alarm on.   Therapy Documentation Precautions:  Precautions Precautions: Fall, Other (comment) Precaution Comments: per previous therapy documention cervical precautions Restrictions Weight Bearing Restrictions: No Pain:    See Function Navigator for Current Functional Status.   Therapy/Group: Individual Therapy  Brockton Mckesson L 06/18/2018, 7:09 AM

## 2018-06-18 NOTE — Progress Notes (Signed)
Physical Therapy Session Note  Patient Details  Name: Gregory Hurley MRN: [id number] Date of Birth: 07-06-1946  Today's Date: 06/18/2018 PT Individual Time: 1045-1200 PT Individual Time Calculation (min): 75 min   Short Term Goals: Week 1:  PT Short Term Goal 1 (Week 1): Pt will transfer with min assist and LRAD PT Short Term Goal 1 - Progress (Week 1): Met PT Short Term Goal 2 (Week 1): Pt will ambulate 100' with LRAD and min assist +1 PT Short Term Goal 2 - Progress (Week 1): Met PT Short Term Goal 3 (Week 1): Pt will negotiate 4 steps with 2 rails and mod assist.  PT Short Term Goal 3 - Progress (Week 1): Met PT Short Term Goal 4 (Week 1): Pt will propel w/c 100' with min assist.  PT Short Term Goal 4 - Progress (Week 1): Not met(Pt is able to propel w/c x 50 ft before onset of fatigue) Week 2:  PT Short Term Goal 1 (Week 2): = LTG due to ELOS     Skilled Therapeutic Interventions/Progress Updates:  Recliner> w/c using stand pivot transfer with RW, min guard assist.  Pt needed 2 attempts to come to standing, due to not scooting far enough out on seat.  W/c propulsion on level tile x 50' using bil UEs, supervision and extra time; theraband on rims of w/c for ijncreased friction.  Pt limited by R hand/wrist function.  NuStep at level 4 x 5 minutes for neuro re-ed via alternating reciprocal movements x 4 extremities, rated 12 on Borg scale.  Stand pivot w/c> mat with RW, min guard assist, with successful stand on first trial, after VCs fot scoot forward in the seat and place feet under knees.  neuromuscular re-education via multimodal cues and demo for reciprocal scooting, and improved forward wt shift to stand. Sustained stretch bil heel cords and hamstrings standing on wedge with bil UE support, x 2 minutes.  Standing with RW : 10 x 1 heel raises, mini squats.     Therapeutic activity in sitting on high mat, without use of UEs , to elevate hips> stand, x 5 x 2.    Gait training with RW  on level tile x 90' with close supervision.  Up/down 12 steps 2 rails, using self selected sequence correctly without cues on 6" high steps, leading with L foot to ascend and R foot to descend, and using self selected L foot to descend 3" high steps without R knee buckling, and step through method with ascending.  Pt left resting in w/c with seat alarm set and all needs within reach.    Therapy Documentation Precautions:  Precautions Precautions: Fall, Other (comment) Precaution Comments: per previous therapy documention cervical precautions Restrictions Weight Bearing Restrictions: No  Pain: Pain Assessment Pain Scale: 0-10 Score 0; premedicated     See Function Navigator for Current Functional Status.   Therapy/Group: Individual Therapy  Gregory Hurley 06/18/2018, 12:11 PM

## 2018-06-18 NOTE — Progress Notes (Signed)
Pt refused bowel program this am. Pt educated on purpose of bowel program. Pt states that daily suppository does not produce results. He states that scheduled bowel medications are more effective.

## 2018-06-18 NOTE — Progress Notes (Signed)
[place] PHYSICAL MEDICINE & REHABILITATION     PROGRESS NOTE    Subjective/Complaints:  No issues overnite, still feeling weak in Right hand, feels numb in both hands and feet  ROS: denies CP, S OB, nausea, vomiting, diarrhea.  Objective:  No results found. Recent Labs    06/18/18 0611  WBC 3.9*  HGB 8.1*  HCT 25.9*  PLT 181   Recent Labs    06/18/18 0611  NA 137  K 3.9  CL 107  GLUCOSE 91  BUN 13  CREATININE 1.16  CALCIUM 8.3*   CBG (last 3)  No results for input(s): GLUCAP in the last 72 hours.  Wt Readings from Last 3 Encounters:  06/07/18 75.8 kg (167 lb)  06/07/18 75.8 kg (167 lb)  05/31/18 77.1 kg (170 lb)     Intake/Output Summary (Last 24 hours) at 06/18/2018 0859 Last data filed at 06/18/2018 0745 Gross per 24 hour  Intake 960 ml  Output 1700 ml  Net -740 ml    Vital Signs: Blood pressure (!) 145/69, pulse 60, temperature 98.3 F (36.8 C), resp. rate 17, height 5\' 11"  (1.803 m), weight 75.8 kg (167 lb), SpO2 99 %. Physical Exam:  Constitutional: No distress . Vital signs reviewed. HENT: Normocephalic.  Atraumatic.   Neck: + Soft collar Eyes: EOMI. No discharge. Cardiovascular: RRR. No JVD. Respiratory: CTA Bilaterally. Normal effort. GI: BS +. Non-distended. Musc: No edema or tenderness in extremities. Neurologic Alert.   Motor: right upper extremity is shoulder abduction 4-/5, elbow flexion 3-/5, elbow extension, wrist extension, handgrip 2/5  Left upper extremity is 4/5 left deltoid and biceps, 3-/5 triceps, finger flexors, wrist extensors, hand intrinsics Sensation identifies LT in BUE Bilateral lower extremities 4-/5 proximal distal. Psychiatric: pleasant. Normal mood. Normal behavior.  Assessment/Plan: 1.  Functional and mobility deficits secondary to cervical central cord syndrome which require 3+ hours per day of interdisciplinary therapy in a comprehensive inpatient rehab setting. Physiatrist is providing close team supervision  and 24 hour management of active medical problems listed below. Physiatrist and rehab team continue to assess barriers to discharge/monitor patient progress toward functional and medical goals.  Function:  Bathing Bathing position   Position: Shower  Bathing parts Body parts bathed by patient: Right arm, Chest, Abdomen, Front perineal area, Right upper leg, Left upper leg, Left arm, Buttocks, Right lower leg, Left lower leg Body parts bathed by helper: Back  Bathing assist Assist Level: Touching or steadying assistance(Pt > 75%)      Upper Body Dressing/Undressing Upper body dressing   What is the patient wearing?: Pull over shirt/dress     Pull over shirt/dress - Perfomed by patient: Thread/unthread left sleeve, Put head through opening, Thread/unthread right sleeve Pull over shirt/dress - Perfomed by helper: Pull shirt over trunk        Upper body assist Assist Level: Touching or steadying assistance(Pt > 75%)      Lower Body Dressing/Undressing Lower body dressing   What is the patient wearing?: Pants, Underwear, Non-skid slipper socks Underwear - Performed by patient: Thread/unthread right underwear leg, Thread/unthread left underwear leg, Pull underwear up/down   Pants- Performed by patient: Thread/unthread right pants leg, Thread/unthread left pants leg, Pull pants up/down Pants- Performed by helper: Pull pants up/down   Non-skid slipper socks- Performed by helper: Don/doff right sock, Don/doff left sock               TED Hose - Performed by helper: Don/doff right TED hose, Don/doff left TED hose  Lower body assist Assist for lower body dressing: Touching or steadying assistance (Pt > 75%)      Toileting Toileting Toileting activity did not occur: Safety/medical concerns Toileting steps completed by patient: Adjust clothing prior to toileting Toileting steps completed by helper: Adjust clothing prior to toileting, Performs perineal hygiene, Adjust clothing after  toileting    Toileting assist Assist level: Touching or steadying assistance (Pt.75%)   Transfers Chair/bed transfer Chair/bed transfer activity did not occur: N/A Chair/bed transfer method: Stand pivot Chair/bed transfer assist level: Touching or steadying assistance (Pt > 75%) Chair/bed transfer assistive device: Armrests, Walker Mechanical lift: Maximove   Locomotion Ambulation     Max distance: 150' Assist level: Supervision or verbal cues   Wheelchair   Type: Manual Max wheelchair distance: 50' Assist Level: Supervision or verbal cues  Cognition Comprehension Comprehension assist level: Follows complex conversation/direction with no assist  Expression Expression assist level: Expresses complex ideas: With no assist  Social Interaction Social Interaction assist level: Interacts appropriately with others with medication or extra time (anti-anxiety, antidepressant).  Problem Solving Problem solving assist level: Solves basic problems with no assist  Memory Memory assist level: Recognizes or recalls 90% of the time/requires cueing < 10% of the time  Medical Problem List and Plan: 1. Tetraparesis, secondary to Central cord syndrome  Continue CIR, PT, OT  Resting WHO RUE at night 2. DVT Prophylaxis/Anticoagulation: Pharmaceutical:Lovenox 3. Pain Management:Oxycodone prn. Continue Neurontin at bedtime 4. Mood:LCSW to follow for evaluation and support. 5. Neuropsych: This patientiscapable of making decisions on hisown behalf. 6. Skin/Wound Care:routine pressure relief measures. 7. Fluids/Electrolytes/Nutrition:  8. HTN: Poorly controlled at baseline with SBP up to 200's. Monitor BID. Continue norvasc, coreg bid and clonidine  Hydralazine 25 3 times a day started on 6/30, monitor BPs   Vitals:   06/17/18 2004 06/18/18 0624  BP: (!) 153/65 (!) 145/69  Pulse: 67 60  Resp: 19 17  Temp: 98.6 F (37 C) 98.3 F (36.8 C)  SpO2: 98% 99%   Fair control this am 7/1 9.  Right ear infection?:   Diagnosed PTA per patient  Continue ciprodex .  10. Constipation: continue Miralax daily and  suppository daily.  11. PTSD with anxiety/depression/nightmares: Managed with Paxil for mood stabilization and trazodone/melatonin for sleep.  12. Emphysema: On albuterol prn. 13. ABLA: Continue to monitor-   -hgb 7.9 on 6/28, 8.1 on 7/1  -Iron supplementation  -Hemoccult positive,  14. Hyponatremia: Resolved  Sodium 136 on 6/26. 137 on 7/1  LOS (Days) [place] EVALUATION WAS PERFORMED  Charlett Blake, MD 06/18/2018 8:59 AM

## 2018-06-19 ENCOUNTER — Inpatient Hospital Stay (HOSPITAL_COMMUNITY): Payer: Medicare HMO | Admitting: Occupational Therapy

## 2018-06-19 ENCOUNTER — Inpatient Hospital Stay (HOSPITAL_COMMUNITY): Payer: PPO

## 2018-06-19 ENCOUNTER — Inpatient Hospital Stay (HOSPITAL_COMMUNITY): Payer: PPO | Admitting: Occupational Therapy

## 2018-06-19 ENCOUNTER — Inpatient Hospital Stay (HOSPITAL_COMMUNITY): Payer: PPO | Admitting: Physical Therapy

## 2018-06-19 MED ORDER — GENERIC EXTERNAL MEDICATION
Status: DC
Start: ? — End: 2018-06-19

## 2018-06-19 NOTE — Progress Notes (Signed)
Physical Therapy Session Note  Patient Details  Name: Gregory Hurley MRN: [id number] Date of Birth: [birth year]  Today's Date: 06/19/2018 PT Individual Time: 0901-1000 PT Individual Time Calculation (min): 59 min   Short Term Goals: Week 2:  PT Short Term Goal 1 (Week 2): = LTG due to ELOS  Skilled Therapeutic Interventions/Progress Updates:    Pt seated in recliner upon PT arrival, agreeable to therapy tx and denies pain. Pt performed sit<>stand from recliner with min assist and ambulated to dayroom x 100 ft with RW and min assist. Pt worked on R hand fine motor coordination in order to string blocks, picking blocks up with R hand while holding string with L hand. Therapist performed R finger extension, wrist extension and supination stretches. Pt ambulated to the gym x 150 ft with RW and min assist. Pt worked on standing balance and UE fine motor coordination to complete peg board puzzle, verbal cues for strategies. Pt ambulated to rehab apartment with RW and min assist, transferred on and off bed min assist. Pt ambulated kitchen and worked on dynamic balance/UE use to open appliances, cabinets and peanut butter jar, therapist providing verbal cues for techniques. Pt transported back to room and left seated with needs in reach.   Therapy Documentation Precautions:  Precautions Precautions: Fall, Other (comment) Precaution Comments: per previous therapy documention cervical precautions Restrictions Weight Bearing Restrictions: No   See Function Navigator for Current Functional Status.   Therapy/Group: Individual Therapy  Netta Corrigan, PT, DPT 06/19/2018, 7:50 AM

## 2018-06-19 NOTE — Patient Care Conference (Signed)
Inpatient RehabilitationTeam Conference and Plan of Care Update Date: 06/19/2018   Time: 10:15 AM    Patient Name: Gregory Hurley      Medical Record Number: [id number]  Date of Birth: 09-29-1946 Sex: Male         Room/Bed: 4W08C/4W08C-01 Payor Info: Payor: [employer] MEDICARE / Plan: [employer] MEDICARE HMO / Product Type: *No Product type* /    Admitting Diagnosis: Central cord Syndrome  Admit Date/Time:  06/07/2018  3:57 PM Admission Comments: No comment available   Primary Diagnosis:  <principal problem not specified> Principal Problem: <principal problem not specified>  Patient Active Problem List   Diagnosis Date Noted  . Hypertensive crisis   . Labile blood pressure   . Drug induced constipation   . Acute blood loss anemia   . Central cord syndrome ([place]) 06/07/2018  . Syncope 07/07/2017  . HTN (hypertension) 07/07/2017  . Depression 07/07/2017  . CKD (chronic kidney disease), stage III ([place]) 07/07/2017  . Hyponatremia 07/07/2017  . Hypokalemia 07/07/2017    Expected Discharge Date: Expected Discharge Date: 06/22/18  Team Members Present: Physician leading conference: Dr. Delice Lesch Social Worker Present: Lennart Pall, LCSW Nurse Present: Leonette Nutting, RN PT Present: Dwyane Dee, PT OT Present: Roanna Epley, Verdie Mosher, OT SLP Present: Charolett Bumpers, SLP PPS Coordinator present : Daiva Nakayama, RN, CRRN     Current Status/Progress Goal Weekly Team Focus  Medical   Still has poor RUE fxn, standing balance improving, BPs control ongoing  reduce fall risk, maximize neuro recovery  D/C planning   Bowel/Bladder   Continent of bowel/bladder; bowel program. LBM 7/1   to  remain continent of bowel/bladder with min assist  Assess bowel/bladder function q shift and as needed   Swallow/Nutrition/ Hydration             ADL's   min A sit <>stand from low surface, min A for UB and LB dressing, mod A for functional transfers, functional transfers/mobility with supervision -  steady assist  mod A for toileting, min A for dressing goals, supervision/set up for all other goals  self care retraining, B UE strengthening and FMC, safety, balance, functional transfers/mobility   Mobility   close supervision for all mobility, occasional min guard with transfers from lower surfaces  supervision, ambulatory with RW  balance, LE strengthening, activity tolerance   Communication             Safety/Cognition/ Behavioral Observations            Pain   No complain of pain  <2  Assess and treat pain q shift and as needed   Skin   surgical incision to the back of the nect  Skin to be free of breakdown/infection with min assist  Assess skin q shift and as needed    Rehab Goals Patient on target to meet rehab goals: Yes *See Care Plan and progress notes for long and short-term goals.     Barriers to Discharge  Current Status/Progress Possible Resolutions Date Resolved   Physician    Decreased caregiver support     progressing toward goals  Cont rehab, see above      Nursing                  PT                    OT                  SLP  SW                Discharge Planning/Teaching Needs:  Pt to d/c home with family providing 24/7 assistance.  Will schedule family ed this week   Team Discussion:  MD adjusting BP meds;  Cont b/b but concern he is refusing some portions of bowel program.  clost to goals with OT of supervision bath/ min assist dsg.  Close to supervision / min assist with mobility.  Pt must have assist at home with toileting hygeine.  Ready for d/c end of week.  SW to schedule family ed.  Revisions to Treatment Plan:  NA    Continued Need for Acute Rehabilitation Level of Care: The patient requires daily medical management by a physician with specialized training in physical medicine and rehabilitation for the following conditions: Daily direction of a multidisciplinary physical rehabilitation program to ensure safe treatment while  eliciting the highest outcome that is of practical value to the patient.: Yes Daily medical management of patient stability for increased activity during participation in an intensive rehabilitation regime.: Yes Daily analysis of laboratory values and/or radiology reports with any subsequent need for medication adjustment of medical intervention for : Neurological problems;Post surgical problems  Bernarr Longsworth 06/21/2018, 10:29 AM

## 2018-06-19 NOTE — Progress Notes (Addendum)
Physical Therapy Session Note  Patient Details  Name: Gregory Hurley MRN: [id number] Date of Birth: Sep 03, 1946  Today's Date: 06/19/2018 PT Individual Time: 1314-3888 PT Individual Time Calculation (min): 45 min   Short Term Goals: Week 2:  PT Short Term Goal 1 (Week 2): = LTG due to ELOS  Skilled Therapeutic Interventions/Progress Updates:    Pt reported no pain prior to treatment session onset. Pt performs ambulatory transfers outside with RW working on navigation of uneven terrain in distracting environment for inc challenge with functional mobility task. Pt consistently performed amb transfers providing steadying A and verbal cues for safe placement of RW to prevent fall risk potential. Pt x2 sets of ~10 ft from w/c<>bench. Pt performed x12 STS from outside bench<> RW with proper hand placement and sequencing of steps to achieve stand. Pt occasionally required VC's to maintain forward trunk flexion to successfully perform lifting to stand. PT provided supervision assist with task. Pt demo'd improved carry over t/o session but requires reinforcement in b/w treatment session days for STS from w/c<>RW. Pt performed 2 sets/ 10 reps standing squats using RW for UE assist. Pt required multimodal cues for proper eccentric lower and posturing for improved exercise performance. Pt required steadying assist to lift and lower onto elevated toilet seat for safety consideration with voiding. Pt able to don/ doff pants and walk with RW to the sink for proper hygiene. Pt was left in recliner with chair alarm activated, soft call bell and tray table in reach and all needs met.   Therapy Documentation Precautions:  Precautions Precautions: Fall, Other (comment) Precaution Comments: per previous therapy documention cervical precautions Restrictions Weight Bearing Restrictions: No   See Function Navigator for Current Functional Status.   Therapy/Group: Individual Therapy  Floreen Comber 06/19/2018, 4:21 PM

## 2018-06-19 NOTE — Progress Notes (Signed)
Occupational Therapy Session Note  Patient Details  Name: Gregory Hurley MRN: [id number] Date of Birth: 28-Jan-1946  Today's Date: 06/19/2018 OT Individual Time: 1829-9371 OT Individual Time Calculation (min): 30 min    Short Term Goals: Week 1:  OT Short Term Goal 1 (Week 1): Pt will wash LUE with RUE with MIN A to demonstrate improved coordination/grasp of R hand OT Short Term Goal 1 - Progress (Week 1): Met OT Short Term Goal 2 (Week 1): Pt will complete BSC/toilet transfer wiht MIN A OT Short Term Goal 2 - Progress (Week 1): Met OT Short Term Goal 3 (Week 1): Pt will advance pants past hips wiht min A OT Short Term Goal 3 - Progress (Week 1): Met OT Short Term Goal 4 (Week 1): Pt will groom in standing at sink for 1 grooming items and MIN A for standing balance OT Short Term Goal 4 - Progress (Week 1): Met OT Short Term Goal 5 (Week 1): Pt will thread 1UE into sleeve of shirt with supervision OT Short Term Goal 5 - Progress (Week 1): Met Week 2:  OT Short Term Goal 1 (Week 2): STGs=LTGs due to ELOS      Skilled Therapeutic Interventions/Progress Updates:    Pt received in recliner and pt stated he would like to focus on his R arm strength. Pt demonstrated the ability to lift R arm to 80-90 degrees but could not extend elbow against gravity or extend fingers.  Pt used RW to walk to w.c and pt taken to gym. Focused on tricep extension with gravity eliminated on table top with sliding elbow out and in and then extending arm forward and back.   To facilitate finger and wrist extension, estim applied to wrist extensors for 8 min at intensity 25.  Pt tolerated well and stated he liked the feeling of the stimulation. Pt focused on active extension during each stimulation phase. Pt taken back to room and transferred back to recliner. Chair alarm on and all needs met.  Therapy Documentation Precautions:  Precautions Precautions: Fall, Other (comment) Precaution Comments: per previous therapy  documention cervical precautions Restrictions Weight Bearing Restrictions: No  Pain: Pain Assessment Pain Score: 0-No pain ADL:  See Function Navigator for Current Functional Status.   Therapy/Group: Individual Therapy  [place] 06/19/2018, 1:06 PM

## 2018-06-19 NOTE — Progress Notes (Signed)
[place] PHYSICAL MEDICINE & REHABILITATION     PROGRESS NOTE    Subjective/Complaints:  States his diarrhea has improved.  Still feels numb in both hands and both feet.  ROS: denies CP, S OB, nausea, vomiting, diarrhea.  Objective:  No results found. Recent Labs    06/18/18 0611  WBC 3.9*  HGB 8.1*  HCT 25.9*  PLT 181   Recent Labs    06/18/18 0611  NA 137  K 3.9  CL 107  GLUCOSE 91  BUN 13  CREATININE 1.16  CALCIUM 8.3*   CBG (last 3)  No results for input(s): GLUCAP in the last 72 hours.  Wt Readings from Last 3 Encounters:  06/07/18 75.8 kg (167 lb)  06/07/18 75.8 kg (167 lb)  05/31/18 77.1 kg (170 lb)     Intake/Output Summary (Last 24 hours) at 06/19/2018 0917 Last data filed at 06/19/2018 0400 Gross per 24 hour  Intake 480 ml  Output 775 ml  Net -295 ml    Vital Signs: Blood pressure (!) 143/66, pulse 65, temperature 98.2 F (36.8 C), resp. rate 17, height 5\' 11"  (1.803 m), weight 75.8 kg (167 lb), SpO2 99 %. Physical Exam:  Constitutional: No distress . Vital signs reviewed. HENT: Normocephalic.  Atraumatic.   Neck: + Soft collar Eyes: EOMI. No discharge. Cardiovascular: RRR. No JVD. Respiratory: CTA Bilaterally. Normal effort. GI: BS +. Non-distended. Musc: No edema or tenderness in extremities. Neurologic Alert.   Motor: right upper extremity is shoulder abduction 4-/5, elbow flexion 3-/5, elbow extension, wrist extension, handgrip 2/5  Left upper extremity is 4/5 left deltoid and biceps, 3-/5 triceps, finger flexors, wrist extensors, hand intrinsics Sensation identifies LT in BUE Bilateral lower extremities 4-/5 proximal distal. Psychiatric: pleasant. Normal mood. Normal behavior.  Assessment/Plan: 1.  Functional and mobility deficits secondary to cervical central cord syndrome which require 3+ hours per day of interdisciplinary therapy in a comprehensive inpatient rehab setting. Physiatrist is providing close team supervision and 24  hour management of active medical problems listed below. Physiatrist and rehab team continue to assess barriers to discharge/monitor patient progress toward functional and medical goals.  Function:  Bathing Bathing position   Position: Shower  Bathing parts Body parts bathed by patient: Right arm, Chest, Abdomen, Front perineal area, Right upper leg, Left upper leg, Left arm, Buttocks, Right lower leg, Left lower leg Body parts bathed by helper: Back  Bathing assist Assist Level: Touching or steadying assistance(Pt > 75%)      Upper Body Dressing/Undressing Upper body dressing   What is the patient wearing?: Pull over shirt/dress     Pull over shirt/dress - Perfomed by patient: Thread/unthread left sleeve, Put head through opening, Thread/unthread right sleeve Pull over shirt/dress - Perfomed by helper: Pull shirt over trunk        Upper body assist Assist Level: Touching or steadying assistance(Pt > 75%)      Lower Body Dressing/Undressing Lower body dressing   What is the patient wearing?: Pants, Underwear, Non-skid slipper socks Underwear - Performed by patient: Thread/unthread right underwear leg, Thread/unthread left underwear leg, Pull underwear up/down   Pants- Performed by patient: Thread/unthread right pants leg, Thread/unthread left pants leg, Pull pants up/down Pants- Performed by helper: Pull pants up/down   Non-skid slipper socks- Performed by helper: Don/doff right sock, Don/doff left sock               TED Hose - Performed by helper: Don/doff right TED hose, Don/doff left TED hose  Lower body assist Assist for lower body dressing: Touching or steadying assistance (Pt > 75%)      Toileting Toileting Toileting activity did not occur: Safety/medical concerns Toileting steps completed by patient: Adjust clothing prior to toileting Toileting steps completed by helper: Adjust clothing prior to toileting, Performs perineal hygiene, Adjust clothing after  toileting Toileting Assistive Devices: Grab bar or rail  Toileting assist Assist level: Touching or steadying assistance (Pt.75%)   Transfers Chair/bed transfer Chair/bed transfer activity did not occur: N/A Chair/bed transfer method: Stand pivot Chair/bed transfer assist level: Touching or steadying assistance (Pt > 75%) Chair/bed transfer assistive device: Armrests, Walker Mechanical lift: Chiropractor     Max distance: 90 Assist level: Supervision or verbal cues   Wheelchair   Type: Manual Max wheelchair distance: 50' Assist Level: Supervision or verbal cues  Cognition Comprehension Comprehension assist level: Understands basic 90% of the time/cues < 10% of the time  Expression Expression assist level: Expresses basic 90% of the time/requires cueing < 10% of the time.  Social Interaction Social Interaction assist level: Interacts appropriately 90% of the time - Needs monitoring or encouragement for participation or interaction.  Problem Solving Problem solving assist level: Solves basic 90% of the time/requires cueing < 10% of the time  Memory Memory assist level: Recognizes or recalls 90% of the time/requires cueing < 10% of the time  Medical Problem List and Plan: 1. Tetraparesis, secondary to Central cord syndrome  Continue CIR, PT, OT, team conference today please see conference note  Resting WHO RUE at night 2. DVT Prophylaxis/Anticoagulation: Pharmaceutical:Lovenox 3. Pain Management:Oxycodone prn. Continue Neurontin at bedtime 4. Mood:LCSW to follow for evaluation and support. 5. Neuropsych: This patientiscapable of making decisions on hisown behalf. 6. Skin/Wound Care:routine pressure relief measures. 7. Fluids/Electrolytes/Nutrition:  8. HTN: Poorly controlled at baseline with SBP up to 200's. Monitor BID. Continue norvasc, coreg bid and clonidine  Hydralazine 25 3 times a day started on 6/30, monitor BPs   Vitals:   06/18/18 2035  06/19/18 0411  BP: (!) 152/57 (!) 143/66  Pulse: 72 65  Resp: 16 17  Temp: 98.2 F (36.8 C) 98.2 F (36.8 C)  SpO2: 100% 99%   Fair control this am 7/2 9. Right ear infection?:   Diagnosed PTA per patient  Continue ciprodex .  10. Constipation: continue Miralax daily and  suppository daily.  11. PTSD with anxiety/depression/nightmares: Managed with Paxil for mood stabilization and trazodone/melatonin for sleep.  12. Emphysema: On albuterol prn. 13. ABLA: Continue to monitor-   -hgb 7.9 on 6/28, 8.1 on 7/1  -Iron supplementation  -Hemoccult positive, continue to monitor    LOS (Days) 12 A FACE TO FACE EVALUATION WAS PERFORMED  Charlett Blake, MD 06/19/2018 9:17 AM

## 2018-06-19 NOTE — Progress Notes (Signed)
Occupational Therapy Session Note  Patient Details  Name: Gregory Hurley MRN: [id number] Date of Birth: [birth year]  Today's Date: 06/19/2018 OT Individual Time: 0702-0800 OT Individual Time Calculation (min): 58 min    Short Term Goals: Week 2:  OT Short Term Goal 1 (Week 2): STGs=LTGs due to ELOS   Skilled Therapeutic Interventions/Progress Updates:    Upon entering the room, pt supine in bed awaiting therapist arrival. Pt performed supine >sit with supervision and use of bed rails. Pt needing lifting assistance to stand from standard bed level. Pt ambulating with RW 10' to recliner chair with close supervision. Pt required set up A for breakfast tray and feeding self without use of built up utensils. OT educated pt on progress towards goals and discussed discharge recommendations for outpatient OT intervention. Pt verbalized understanding and agreement. Pt engaged in bathing and dressing from sit <>stand at wheelchair with overall min A for standing balance. Pt unable to don B socks. He did attempt to utilize R UE as well for functional task such as pulling up pants over B hips. Pt ambulating back to wheelchair at end of session in same manner as above. Call bell and all needed items within reach upon exiting the room. Chair alarm activated.   Therapy Documentation Precautions:  Precautions Precautions: Fall, Other (comment) Precaution Comments: per previous therapy documention cervical precautions Restrictions Weight Bearing Restrictions: No   Pain: Pain Assessment Pain Scale: 0-10 Pain Score: 0-No pain  See Function Navigator for Current Functional Status.   Therapy/Group: Individual Therapy  [name] 06/19/2018, 10:25 AM

## 2018-06-20 ENCOUNTER — Inpatient Hospital Stay (HOSPITAL_COMMUNITY): Payer: Medicare HMO | Admitting: Physical Therapy

## 2018-06-20 ENCOUNTER — Inpatient Hospital Stay (HOSPITAL_COMMUNITY): Payer: PPO | Admitting: Occupational Therapy

## 2018-06-20 ENCOUNTER — Inpatient Hospital Stay (HOSPITAL_COMMUNITY): Payer: Medicare HMO

## 2018-06-20 LAB — CBC
HEMATOCRIT: 27 % — AB (ref 39.0–52.0)
HEMOGLOBIN: 8.5 g/dL — AB (ref 13.0–17.0)
MCH: 27.2 pg (ref 26.0–34.0)
MCHC: 31.5 g/dL (ref 30.0–36.0)
MCV: 86.3 fL (ref 78.0–100.0)
Platelets: 164 10*3/uL (ref 150–400)
RBC: 3.13 MIL/uL — ABNORMAL LOW (ref 4.22–5.81)
RDW: 15.3 % (ref 11.5–15.5)
WBC: 4.4 10*3/uL (ref 4.0–10.5)

## 2018-06-20 LAB — BASIC METABOLIC PANEL
ANION GAP: 5 (ref 5–15)
BUN: 14 mg/dL (ref 8–23)
CO2: 25 mmol/L (ref 22–32)
Calcium: 8.4 mg/dL — ABNORMAL LOW (ref 8.9–10.3)
Chloride: 108 mmol/L (ref 98–111)
Creatinine, Ser: 1.21 mg/dL (ref 0.61–1.24)
GFR calc Af Amer: >60 mL/min (ref >60)
GFR calc non Af Amer: 58 mL/min — ABNORMAL LOW (ref >60)
GLUCOSE: 93 mg/dL (ref 70–99)
POTASSIUM: 3.9 mmol/L (ref 3.5–5.1)
Sodium: 138 mmol/L (ref 135–145)

## 2018-06-20 NOTE — Progress Notes (Addendum)
Physical Therapy Session Note  Patient Details  Name: Gregory Hurley MRN: [id number] Date of Birth: [birth year]  Today's Date: 06/20/2018 PT Individual Time: 0905-1005 PT Individual Time Calculation (min): 60 min   Short Term Goals: Week 1:  PT Short Term Goal 1 (Week 1): Pt will transfer with min assist and LRAD PT Short Term Goal 1 - Progress (Week 1): Met PT Short Term Goal 2 (Week 1): Pt will ambulate 100' with LRAD and min assist +1 PT Short Term Goal 2 - Progress (Week 1): Met PT Short Term Goal 3 (Week 1): Pt will negotiate 4 steps with 2 rails and mod assist.  PT Short Term Goal 3 - Progress (Week 1): Met PT Short Term Goal 4 (Week 1): Pt will propel w/c 100' with min assist.  PT Short Term Goal 4 - Progress (Week 1): Not met(Pt is able to propel w/c x 50 ft before onset of fatigue) Week 2:  PT Short Term Goal 1 (Week 2): = LTG due to ELOS      Skilled Therapeutic Interventions/Progress Updates:   Dr. Letta Pate removed dressing over cervical surgical site; steri strips in place.  He stated pt could shower without covering area.  Pt's RUE noted to be edematous.  PT instructed pt in retrograde massage, and elevating R hand on a pillow when in bed or sitting. Pt demonstrated the massage safely.   Seated without back support: 15 x 1 bil hip adduction bil heel raises, 10 x 1 R/L long arc quad knee extension with ankle pumps at end range.  Stand pivot with RW with close supervision.  Therapeutic activitites to facilitate transfers, use of RUE, RLE and dynamic standing balance: Reciprocal scooting wtihout use of hands on 20.5" high mat Sit>< stand without use of UEs. Standing matching playing cards on board in front of him, using L hand, 100% accuracy, 9/9 cards.  Standing removing cards from board with R hand, with min assist fading to supervision. Seated, wearing gloves donned by PT, cleaning cards using bil hands and placing back on board with R hand, supervision. STanding with LUE  support, kicking cones with R foot, focusing on knee flexion, speed and power. Standing with LUE support, kicking cones with L foot, focusing on R knee and hip stance stability; R knee generally flexed but occasionally hyperextended with full wt bearing when kicking with L foot.  Gait training on level tile with RW, close supervision.  Up/down 12 steps 2 rails, without cues; self selected step to on 6" high steps ascending, step through when ascending/descending 3" high steps and descending 6" high steps, without knee buckling.  Pt left resting in recliner with seat alarm set and needs at hand.    Therapy Documentation Precautions:  Precautions Precautions: Fall, Gregory (comment) Precaution Comments: per previous therapy documention cervical precautions Restrictions Weight Bearing Restrictions: No   Pain: pt denies Pain Assessment Pain Scale: 0-10 Pain Score: 0-No pain    See Function Navigator for Current Functional Status.   Therapy/Group: Individual Therapy  Gregory Hurley 06/20/2018, 10:13 AM

## 2018-06-20 NOTE — Progress Notes (Signed)
Occupational Therapy Session Note  Patient Details  Name: Gregory Hurley MRN: [id number] Date of Birth: 09-08-1946  Today's Date: 06/20/2018 OT Individual Time: 8208-1388 OT Individual Time Calculation (min): 75 min    Short Term Goals: Week 2:  OT Short Term Goal 1 (Week 2): STGs=LTGs due to ELOS  Skilled Therapeutic Interventions/Progress Updates:    Upon entering the room, pt seated in recliner chair with no c/o pain this session. Pt reports, " My R hand feels a lot better". Pt requests to eat breakfast before exiting the room. He needed set up A to open containers and cut food items but able to feed himself with utensils and use of L UE. Pt ambulated to wheelchair with supervision and use of RW. OT assisted pt outside to main entrance. Pt continued HEP for R hand with use of tan, soft theraputty with min verbal cues for proper technique. Pt standing from wheelchair with supervision and ambulating outside with use of RW on uneven surfaces 150' x 2 with close supervision. Pt needing to take seated rest break secondary to fatigue but experienced no LOB. OT assisted pt back to room via wheelchair secondary to fatigue. Pt transferred into recliner chair with supervision and use of RW. Chair alarm activated for safety. All needs within reach.   Therapy Documentation Precautions:  Precautions Precautions: Fall, Other (comment) Precaution Comments: per previous therapy documention cervical precautions Restrictions Weight Bearing Restrictions: No Pain: Pain Assessment Pain Scale: 0-10 Pain Score: 0-No pain  See Function Navigator for Current Functional Status.   Therapy/Group: Individual Therapy  [name] 06/20/2018, 11:06 AM

## 2018-06-20 NOTE — Progress Notes (Signed)
Physical Therapy Session Note  Patient Details  Name: Gregory Hurley MRN: [id number] Date of Birth: 03/04/1946  Today's Date: 06/20/2018 PT Individual Time: 1415-1530 PT Individual Time Calculation (min): 75 min   Short Term Goals: Week 2:  PT Short Term Goal 1 (Week 2): = LTG due to ELOS  Skilled Therapeutic Interventions/Progress Updates:    Pt reported no pain prior to treatment session onset. Pt performed bed mobility with no assist and increased time to perform supine> EOB using bed rails. Pt performed toiletting with steadying A for don/doff LE pants and underwear and cleansing perianal region. Pt performed x3 trials t/o treatment duration of amb with RW for 150 feet with supervision. PT initiated dynamic balance activities including corn hole, shuffle board, and horse shoes progressing from UE support> no UE with tasks> functional reaching with reciprocal stepping when tossing horse shoes. PT then initiated STS activity from the couch and the recliner for increased challenge during LE strengthening activity. Pt performed 1 set of 5 reps from each surface requiring min fading to mod A for boosting during fatigue onset. Pt able to demonstrate and verbalize correct sequencing of steps to perform stand but required boosting assist. PT implemented standing tolerance and balancing incorporating close pin activity with R UE for NMR. Pt further progressed Pt to standing gripping close pins while standing on foam to challenge ankle strategy while functionally reaching to place/ retrieve pins from hoop. Pt was returned to room with tray table and soft call bell in reach and all needs met.  Therapy Documentation Precautions:  Precautions Precautions: Fall, Other (comment) Precaution Comments: per previous therapy documention cervical precautions Restrictions Weight Bearing Restrictions: No   See Function Navigator for Current Functional Status.   Therapy/Group: Individual Therapy  Floreen Comber 06/20/2018, 4:50 PM

## 2018-06-20 NOTE — Progress Notes (Signed)
Social Work Patient ID: Gregory Hurley, male   DOB: [birth year], 72 y.o.   MRN: [id number]   Have reviewed team conference with pt and daughter.  Both pleased with progress and feeling prepared for d/c on Friday.  Have scheduled family ed to take place tomorrow morning 10-12 and team aware.  Will touch base with all in the morning as well.  Calle Schader, LCSW

## 2018-06-20 NOTE — Progress Notes (Signed)
[facility] PHYSICAL MEDICINE & REHABILITATION     PROGRESS NOTE    Subjective/Complaints:  Good BM this am, supp last noc, pt Supervison with sit to stand.  Per PT D/C date 7/5  ROS: denies CP, S OB, nausea, vomiting, diarrhea.  Objective:  No results found. Recent Labs    06/18/18 0611 06/20/18 0526  WBC 3.9* 4.4  HGB 8.1* 8.5*  HCT 25.9* 27.0*  PLT 181 164   Recent Labs    06/18/18 0611 06/20/18 0526  NA 137 138  K 3.9 3.9  CL 107 108  GLUCOSE 91 93  BUN 13 14  CREATININE 1.16 1.21  CALCIUM 8.3* 8.4*   CBG (last 3)  No results for input(s): GLUCAP in the last 72 hours.  Wt Readings from Last 3 Encounters:  06/07/18 75.8 kg (167 lb)  06/07/18 75.8 kg (167 lb)  05/31/18 77.1 kg (170 lb)     Intake/Output Summary (Last 24 hours) at 06/20/2018 0921 Last data filed at 06/20/2018 0733 Gross per 24 hour  Intake 860 ml  Output 1650 ml  Net -790 ml    Vital Signs: Blood pressure 133/68, pulse 66, temperature 99.4 F (37.4 C), temperature source Oral, resp. rate 19, height 5\' 11"  (1.803 m), weight 75.8 kg (167 lb), SpO2 100 %. Physical Exam:  Constitutional: No distress . Vital signs reviewed. HENT: Normocephalic.  Atraumatic.   Neck: + Soft collar Eyes: EOMI. No discharge. Cardiovascular: RRR. No JVD. Respiratory: CTA Bilaterally. Normal effort. GI: BS +. Non-distended. Musc: No edema or tenderness in extremities. Neurologic Alert.   Motor: right upper extremity is shoulder abduction 4-/5, elbow flexion 3-/5, elbow extension, wrist extension, handgrip 2/5  Left upper extremity is 4/5 left deltoid and biceps, 3-/5 triceps, finger flexors, wrist extensors, hand intrinsics Sensation identifies LT in BUE Bilateral lower extremities 4-/5 proximal distal. Psychiatric: pleasant. Normal mood. Normal behavior.  Assessment/Plan: 1.  Functional and mobility deficits secondary to cervical central cord syndrome which require 3+ hours per day of interdisciplinary  therapy in a comprehensive inpatient rehab setting. Physiatrist is providing close team supervision and 24 hour management of active medical problems listed below. Physiatrist and rehab team continue to assess barriers to discharge/monitor patient progress toward functional and medical goals.  Function:  Bathing Bathing position   Position: Wheelchair/chair at sink  Bathing parts Body parts bathed by patient: Right arm, Chest, Abdomen, Front perineal area, Right upper leg, Left upper leg, Left arm, Buttocks, Right lower leg, Left lower leg Body parts bathed by helper: Back  Bathing assist Assist Level: Touching or steadying assistance(Pt > 75%)      Upper Body Dressing/Undressing Upper body dressing   What is the patient wearing?: Pull over shirt/dress, Orthosis     Pull over shirt/dress - Perfomed by patient: Thread/unthread left sleeve, Put head through opening, Thread/unthread right sleeve Pull over shirt/dress - Perfomed by helper: Pull shirt over trunk     Orthosis activity level: Performed by helper  Upper body assist Assist Level: Touching or steadying assistance(Pt > 75%)      Lower Body Dressing/Undressing Lower body dressing   What is the patient wearing?: Pants, Underwear, Non-skid slipper socks Underwear - Performed by patient: Thread/unthread right underwear leg, Thread/unthread left underwear leg, Pull underwear up/down   Pants- Performed by patient: Thread/unthread right pants leg, Thread/unthread left pants leg, Pull pants up/down Pants- Performed by helper: Pull pants up/down   Non-skid slipper socks- Performed by helper: Don/doff right sock, Don/doff left sock  TED Hose - Performed by helper: Don/doff right TED hose, Don/doff left TED hose  Lower body assist Assist for lower body dressing: Touching or steadying assistance (Pt > 75%)      Toileting Toileting Toileting activity did not occur: Safety/medical concerns Toileting steps  completed by patient: Adjust clothing prior to toileting, Adjust clothing after toileting Toileting steps completed by helper: Adjust clothing prior to toileting, Performs perineal hygiene, Adjust clothing after toileting Toileting Assistive Devices: Grab bar or rail  Toileting assist Assist level: Touching or steadying assistance (Pt.75%)   Transfers Chair/bed transfer Chair/bed transfer activity did not occur: N/A Chair/bed transfer method: Stand pivot Chair/bed transfer assist level: Touching or steadying assistance (Pt > 75%) Chair/bed transfer assistive device: Armrests, Walker Mechanical lift: Chiropractor     Max distance: 20 Assist level: Touching or steadying assistance (Pt > 75%)   Wheelchair   Type: Manual Max wheelchair distance: 50' Assist Level: Supervision or verbal cues  Cognition Comprehension Comprehension assist level: Understands basic 90% of the time/cues < 10% of the time  Expression Expression assist level: Expresses basic 90% of the time/requires cueing < 10% of the time.  Social Interaction Social Interaction assist level: Interacts appropriately 90% of the time - Needs monitoring or encouragement for participation or interaction.  Problem Solving Problem solving assist level: Solves basic 90% of the time/requires cueing < 10% of the time  Memory Memory assist level: Recognizes or recalls 90% of the time/requires cueing < 10% of the time  Medical Problem List and Plan: 1. Tetraparesis, secondary to Central cord syndrome  Continue CIR, PT, OT,progressing well  Resting WHO RUE at night 2. DVT Prophylaxis/Anticoagulation: Pharmaceutical:Lovenox 3. Pain Management:Oxycodone prn. Continue Neurontin at bedtime 4. Mood:LCSW to follow for evaluation and support. 5. Neuropsych: This patientiscapable of making decisions on hisown behalf. 6. Skin/Wound Care:routine pressure relief measures. 7. Fluids/Electrolytes/Nutrition:  8. HTN:  Poorly controlled at baseline with SBP up to 200's. Monitor BID. Continue norvasc, coreg bid and clonidine  Hydralazine 25 3 times a day started on 6/30, monitor BPs   Vitals:   06/19/18 2012 06/20/18 0511  BP: (!) 128/54 133/68  Pulse: (!) 59 66  Resp: 18 19  Temp: 99.6 F (37.6 C) 99.4 F (37.4 C)  SpO2: 100% 100%  Good control this am 7/3 9. Right ear infection?:   Diagnosed PTA per patient  Continue ciprodex .  10. Constipation: continue Miralax daily and  suppository daily.  11. PTSD with anxiety/depression/nightmares: Managed with Paxil for mood stabilization and trazodone/melatonin for sleep.  12. Emphysema: On albuterol prn. 13. ABLA: Continue to monitor-   -hgb 7.9 on 6/28, 8.1 on 7/1  -Iron supplementation  -Hemoccult positive, continue to monitor    LOS (Days) [place] EVALUATION WAS PERFORMED  Charlett Blake, MD 06/20/2018 9:21 AM

## 2018-06-21 ENCOUNTER — Inpatient Hospital Stay (HOSPITAL_COMMUNITY): Payer: Medicare HMO | Admitting: Occupational Therapy

## 2018-06-21 ENCOUNTER — Ambulatory Visit (HOSPITAL_COMMUNITY): Payer: Medicare HMO | Admitting: Physical Therapy

## 2018-06-21 MED ORDER — GENERIC EXTERNAL MEDICATION
Status: DC
Start: ? — End: 2018-06-21

## 2018-06-21 NOTE — Progress Notes (Signed)
Physical Therapy Discharge Summary  Patient Details  Name: Gregory Hurley MRN: [id number] Date of Birth: 30-Aug-1946  Today's Date: 06/21/2018 PT Individual Time: 1100-1200 PT Individual Time Calculation (min): 60 min    Patient has met 8 of 8 long term goals due to improved activity tolerance, improved balance, improved postural control, increased strength, increased range of motion, ability to compensate for deficits, functional use of  right upper extremity, improved attention and improved coordination.  Patient to discharge at an ambulatory level Supervision.   Patient's care partner is independent to provide the necessary physical assistance at discharge.   Recommendation:  Patient will benefit from ongoing skilled PT services in outpatient setting to continue to advance safe functional mobility, address ongoing impairments in strength, coordination, motor control, balance, ambulation, stair negotiation, functional mobility activities and minimize fall risk.  Equipment: RW  Reasons for discharge: treatment goals met  Patient/family agrees with progress made and goals achieved: Yes  Skilled Physical Therapy:  Pt reported no pain prior to treatment session onset. PT provided family education regarding Pt's progress with therapy and proper mechanics for safe ambulation with Pt. Today's session focusing on safe ambulation with RW, stair negotiation, transfers, and floor recovery. PT educated and demonstrated proper body mechanics and positioning when walking next to Pt for 150 feet with SPV, proper mechanics on stairs with Pt, and how to assist Pt properly when he is performing all transfers. Pt daughter demonstrated each task being assessed correctly and verbalized understanding with regards to all safety considerations. PT verbalized and demonstrated proper mechanics during fall recovery task and provided mod A to help Pt from side lying on floor to achieve quadriped position. Pt was able to walk  on all fours to mat and received multimodal cues for correct extremity placement. PT provided mod A for boosting Pt from kneeling > mat for recovery. PT implemented R UE foam squeezing to assist with dec edema via muscle pumping action. PT answered all questions and concerns with daughter and Pt. PT returned Pt to room in recliner with tray table and call bell in reach and all needs met.    PT Discharge Precautions/Restrictions Precautions Precautions: Fall Restrictions Weight Bearing Restrictions: No Pain Pain Assessment Pain Scale: 0-10 Pain Score: 0-No pain Vision/Perception  Perception Perception: Within Functional Limits Praxis Praxis: Intact  Cognition Overall Cognitive Status: Within Functional Limits for tasks assessed Arousal/Alertness: Awake/alert Orientation Level: Oriented X4;Oriented to person;Oriented to place;Oriented to time Safety/Judgment: Appears intact Sensation Sensation Light Touch: Appears Intact Coordination Gross Motor Movements are Fluid and Coordinated: Yes Heel Shin Test: impaired due to dec flexibility on R LE Motor  Motor Motor: Tetraplegia Motor - Discharge Observations: tetraplegia with UEs>LE, and R side >L side for UEs and LEs  Mobility Bed Mobility Bed Mobility: Supine to Sit;Sit to Supine Supine to Sit: Independent with assistive device Sit to Supine: Independent with assistive device Transfers Transfers: Stand to Sit;Sit to Stand;Stand Pivot Transfers Sit to Stand: Supervision/Verbal cueing Stand to Sit: Supervision/Verbal cueing Stand Pivot Transfers: Supervision/Verbal cueing Transfer (Assistive device): Rolling walker Locomotion  Gait Ambulation: Yes Gait Assistance: Supervision/Verbal cueing Gait Distance (Feet): 150 Feet Assistive device: Rolling walker Gait Gait: Yes Stairs / Additional Locomotion Stairs: Yes Stairs Assistance: Supervision/Verbal cueing Stair Management Technique: Two rails Number of Stairs: 12 Height  of Stairs: 6 Wheelchair Mobility Wheelchair Mobility: No  Trunk/Postural Assessment  Cervical Assessment Cervical Assessment: (Cervical collar) Thoracic Assessment Thoracic Assessment: Within Functional Limits Lumbar Assessment Lumbar Assessment: Within Functional Limits Postural Control  Postural Control: Within Functional Limits  Balance Balance Balance Assessed: Yes Static Sitting Balance Static Sitting - Balance Support: No upper extremity supported;Feet supported Static Sitting - Level of Assistance: 5: Stand by assistance Dynamic Sitting Balance Dynamic Sitting - Balance Support: During functional activity;No upper extremity supported;Feet supported Dynamic Sitting - Level of Assistance: 5: Stand by assistance Static Standing Balance Static Standing - Balance Support: No upper extremity supported Static Standing - Level of Assistance: 5: Stand by assistance Static Standing - Comment/# of Minutes: Pt performed tossing of horse shoes with stand by assist with ability to maintain standing balance with minimial sway Dynamic Standing Balance Dynamic Standing - Balance Support: No upper extremity supported Dynamic Standing - Level of Assistance: 5: Stand by assistance Extremity Assessment  RUE Assessment RUE Assessment: Not tested LUE Assessment LUE Assessment: Not tested RLE Assessment RLE Assessment: Within Functional Limits Active Range of Motion (AROM) Comments: WFL LLE Assessment LLE Assessment: Within Functional Limits Active Range of Motion (AROM) Comments: wfl   See Function Navigator for Current Functional Status.  Floreen Comber 06/21/2018, 12:45 PM

## 2018-06-21 NOTE — Progress Notes (Signed)
[facility] & RE[facility]ABILIT[facility]     PROGRESS NOTE    Subjective/Complaints:  No issues overnite asking about washing the soft collar  ROS: denies CP, S OB, nausea, vomiting, diarrhea.  Objective:  No results found. Recent Labs    06/20/18 0526  WBC 4.4  HGB 8.5*  HCT 27.0*  PLT 164   Recent Labs    06/20/18 0526  NA 138  K 3.9  CL 108  GLUCOSE 93  BUN 14  CREATININE 1.21  CALCIUM 8.4*   CBG (last 3)  No results for input(s): GLUCAP in the last 72 hours.  Wt Readings from Last 3 Encounters:  06/07/18 75.8 kg (167 lb)  06/07/18 75.8 kg (167 lb)  05/31/18 77.1 kg (170 lb)     Intake/Output Summary (Last 24 hours) at 06/21/2018 1023 Last data filed at 06/21/2018 0102 Gross per 24 hour  Intake 720 ml  Output 1350 ml  Net -630 ml    Vital Signs: Blood pressure (!) 116/55, pulse (!) 59, temperature 99.9 F (37.7 C), temperature source Oral, resp. rate 16, height 5\' 11"  (1.803 m), weight 75.8 kg (167 lb), SpO2 100 %. Physical Exam:  Constitutional: No distress . Vital signs reviewed. HENT: Normocephalic.  Atraumatic.   Neck: + Soft collar Eyes: EOMI. No discharge. Cardiovascular: RRR. No JVD. Respiratory: CTA Bilaterally. Normal effort. GI: BS +. Non-distended. Musc: No edema or tenderness in extremities. Neurologic Alert.   Motor: right upper extremity is shoulder abduction 4-/5, elbow flexion 3-/5, elbow extension, wrist extension, handgrip 2/5  Left upper extremity is 4/5 left deltoid and biceps, 3-/5 triceps, finger flexors, wrist extensors, hand intrinsics Sensation identifies LT in BUE Bilateral lower extremities 4-/5 proximal distal. Psychiatric: pleasant. Normal mood. Normal behavior.  Assessment/Plan: 1.  Functional and mobility deficits secondary to cervical central cord syndrome which require 3+ hours per day of interdisciplinary therapy in a comprehensive inpatient rehab setting. Physiatrist is providing close team supervision  and 24 hour management of active medical problems listed below. Physiatrist and rehab team continue to assess barriers to discharge/monitor patient progress toward functional and medical goals.  Function:  Bathing Bathing position   Position: Wheelchair/chair at sink  Bathing parts Body parts bathed by patient: Right arm, Chest, Abdomen, Front perineal area, Right upper leg, Left upper leg, Left arm, Buttocks, Right lower leg, Left lower leg Body parts bathed by helper: Back  Bathing assist Assist Level: Touching or steadying assistance(Pt > 75%)      Upper Body Dressing/Undressing Upper body dressing   What is the patient wearing?: Pull over shirt/dress, Orthosis     Pull over shirt/dress - Perfomed by patient: Thread/unthread left sleeve, Put head through opening, Thread/unthread right sleeve Pull over shirt/dress - Perfomed by helper: Pull shirt over trunk     Orthosis activity level: Performed by helper  Upper body assist Assist Level: Touching or steadying assistance(Pt > 75%)      Lower Body Dressing/Undressing Lower body dressing   What is the patient wearing?: Pants, Underwear, Non-skid slipper socks Underwear - Performed by patient: Thread/unthread right underwear leg, Thread/unthread left underwear leg, Pull underwear up/down   Pants- Performed by patient: Thread/unthread right pants leg, Thread/unthread left pants leg, Pull pants up/down Pants- Performed by helper: Pull pants up/down   Non-skid slipper socks- Performed by helper: Don/doff right sock, Don/doff left sock               TED Hose - Performed by helper: Don/doff right TED hose,  Don/doff left TED hose  Lower body assist Assist for lower body dressing: Touching or steadying assistance (Pt > 75%)      Toileting Toileting Toileting activity did not occur: Safety/medical concerns Toileting steps completed by patient: Adjust clothing prior to toileting, Adjust clothing after toileting Toileting steps  completed by helper: Performs perineal hygiene Toileting Assistive Devices: Grab bar or rail  Toileting assist Assist level: Touching or steadying assistance (Pt.75%)   Transfers Chair/bed transfer Chair/bed transfer activity did not occur: N/A Chair/bed transfer method: Stand pivot Chair/bed transfer assist level: Touching or steadying assistance (Pt > 75%) Chair/bed transfer assistive device: Armrests, Walker Mechanical lift: Chiropractor     Max distance: 150 x3 Assist level: Supervision or verbal cues   Wheelchair   Type: Manual Max wheelchair distance: 50' Assist Level: Supervision or verbal cues  Cognition Comprehension Comprehension assist level: Understands basic 90% of the time/cues < 10% of the time  Expression Expression assist level: Expresses basic 90% of the time/requires cueing < 10% of the time.  Social Interaction Social Interaction assist level: Interacts appropriately 90% of the time - Needs monitoring or encouragement for participation or interaction.  Problem Solving Problem solving assist level: Solves basic 90% of the time/requires cueing < 10% of the time  Memory Memory assist level: Recognizes or recalls 90% of the time/requires cueing < 10% of the time  Medical Problem List and Plan: 1. Tetraparesis, secondary to Central cord syndrome  Continue CIR, PT, OT,progressing well  Resting WHO RUE at night 2. DVT Prophylaxis/Anticoagulation: Pharmaceutical:Lovenox 3. Pain Management:Oxycodone prn. Continue Neurontin at bedtime 4. Mood:LCSW to follow for evaluation and support. 5. Neuropsych: This patientiscapable of making decisions on hisown behalf. 6. Skin/Wound Care:routine pressure relief measures. 7. Fluids/Electrolytes/Nutrition:improved fluid intake to 1125ml  yesterday  8. HTN: Poorly controlled at baseline with SBP up to 200's. Monitor BID. Continue norvasc, coreg bid and clonidine  Hydralazine 25 3 times a day started  on 6/30, monitor BPs   Vitals:   06/21/18 0600 06/21/18 0833  BP: (!) 142/62 (!) 116/55  Pulse: 61 (!) 59  Resp: 16   Temp:    SpO2: 100%   Good control this am 7/4 9. Right ear infection?:   Diagnosed PTA per patient  Continue ciprodex .  10. Constipation: continue Miralax daily and  suppository daily.  11. PTSD with anxiety/depression/nightmares: Managed with Paxil for mood stabilization and trazodone/melatonin for sleep.  12. Emphysema: On albuterol prn. 13. ABLA: Continue to monitor-   -hgb 7.9 on 6/28, 8.1 on 7/1  -Iron supplementation  -Hemoccult positive, continue to monitor  Will order 2nd soft collar to allow for washing  LOS (Days) [place] EVALUATION WAS PERFORMED  Charlett Blake, MD 06/21/2018 10:23 AM

## 2018-06-21 NOTE — Progress Notes (Signed)
Occupational Therapy Discharge Summary  Patient Details  Name: Gregory Hurley MRN: [id number] Date of Birth: 1946-08-10  Today's Date: 06/21/2018 OT Individual Time: 1660-6301 and 1000-1056 OT Individual Time Calculation (min): 75 min and 56 min    Patient has met 8 of 9 long term goals due to improved activity tolerance, improved balance, ability to compensate for deficits, improved attention, improved awareness and improved coordination.  Patient to discharge at overall S- min A level.  Patient's care partner is independent to provide the necessary physical assistance at discharge.    Reasons goals not met: eating goal not met secondary to pt still needing set up A to open containers and cut up food  Recommendation:  Patient will benefit from ongoing skilled OT services in outpatient setting to continue to advance functional skills in the area of BADL and iADL.  Equipment: TTB  Reasons for discharge: treatment goals met  Patient/family agrees with progress made and goals achieved: Yes   Session 1: Upon entering the room, pt seated in recliner chair awaiting OT arrival. Pt agreeable to OT intervention. Pt reporting need for BM and ambulated with RW and supervision into bathroom. Pt able to perform clothing management with increased time and supervision but needing assistance for clothing management. Pt exiting bathroom and obtaining clothing items from dresser with overall supervision while standing. Pt returning to wheelchair and seated at sink for bathing and dressing with sit <>stand from sink with overall close supervision for safety. Pt did need assistance to don B socks but able to slip on B shoes and fasten. Pt returning to sit in recliner chair at end of session with breakfast tray set up. Call bell and all needed items within reach upon exiting the room.   Session 2: Pt's daughter, Clarene Critchley, present for hands on family education. OT providing education to caregiver regarding pt's  progress towards goals. Pt demonstrated HEP with paper handout and use of tan. Soft theraputty. OT demonstrated how to don resting hand splint and day time brace with caregiver returning demonstration. Pt ambulating to ADL apartment 150' RW and supervision provided by caregiver. OT demonstrated transfer onto TTB in tub shower with use of RW. Pt returned demonstration with supervision from caregiver. OT educated pt and caregiver on safety concerns and fall risk within the bathroom. OT also providing education to pt regarding energy conservation and handout provided. Pt ambulating back to room in same manner. No further questions at this time.   OT Discharge Precautions/Restrictions  Precautions Precautions: Fall Precaution Comments: per previous therapy documention cervical precautions Restrictions Weight Bearing Restrictions: No Vital Signs Therapy Vitals Temp: 99.8 F (37.7 C) Temp Source: Oral Pulse Rate: 60 Resp: 16 BP: (!) 124/54 Patient Position (if appropriate): Lying Oxygen Therapy SpO2: 100 % O2 Device: Room Air Pain Pain Assessment Pain Scale: 0-10 Pain Score: 0-No pain Vision Baseline Vision/History: Wears glasses Wears Glasses: At all times Patient Visual Report: No change from baseline Vision Assessment?: No apparent visual deficits Perception  Perception: Within Functional Limits Praxis Praxis: Intact Cognition Overall Cognitive Status: Within Functional Limits for tasks assessed Arousal/Alertness: Awake/alert Orientation Level: Oriented X4;Oriented to person;Oriented to place;Oriented to time Safety/Judgment: Appears intact Sensation Sensation Light Touch: Appears Intact Coordination Gross Motor Movements are Fluid and Coordinated: Yes Fine Motor Movements are Fluid and Coordinated: No Heel Shin Test: impaired due to dec flexibility on R LE Motor  Motor Motor: Tetraplegia Motor - Discharge Observations: tetraplegia with UEs>LE, and R side >L side for UEs  and LEs  Mobility  Bed Mobility Bed Mobility: Supine to Sit;Sit to Supine Rolling Left: Supervision/Verbal cueing Supine to Sit: Independent with assistive device Sit to Supine: Independent with assistive device Transfers Sit to Stand: Supervision/Verbal cueing Stand to Sit: Supervision/Verbal cueing  Trunk/Postural Assessment  Cervical Assessment Cervical Assessment: Exceptions to WFL(soft collar) Thoracic Assessment Thoracic Assessment: Within Functional Limits Lumbar Assessment Lumbar Assessment: Within Functional Limits Postural Control Postural Control: Within Functional Limits  Balance Balance Balance Assessed: Yes Static Sitting Balance Static Sitting - Balance Support: No upper extremity supported;Feet supported Static Sitting - Level of Assistance: 5: Stand by assistance Dynamic Sitting Balance Dynamic Sitting - Balance Support: During functional activity;No upper extremity supported;Feet supported Dynamic Sitting - Level of Assistance: 5: Stand by assistance Static Standing Balance Static Standing - Balance Support: No upper extremity supported Static Standing - Level of Assistance: 5: Stand by assistance Static Standing - Comment/# of Minutes: Pt performed tossing of horse shoes with stand by assist with ability to maintain standing balance with minimial sway Dynamic Standing Balance Dynamic Standing - Balance Support: No upper extremity supported Dynamic Standing - Level of Assistance: 5: Stand by assistance Extremity/Trunk Assessment RUE Assessment RUE Assessment: Exceptions to [facility] Active Range of Motion (AROM) Comments: 2-/5 throughout LUE Assessment LUE Assessment: Within Functional Limits   See Function Navigator for Current Functional Status.  [name], MS, OTR/L 06/21/2018, 1:59 PM

## 2018-06-22 MED ORDER — ACETAMINOPHEN 325 MG PO TABS: 325.0000 mg | ORAL_TABLET | ORAL | Status: DC | PRN

## 2018-06-22 MED ORDER — BISACODYL 10 MG RE SUPP: 10.0000 mg | Freq: Every day | RECTAL | 0 refills | Status: DC

## 2018-06-22 MED ORDER — AMLODIPINE BESYLATE 10 MG PO TABS: 10.0000 mg | ORAL_TABLET | Freq: Every day | ORAL | 0 refills | Status: DC

## 2018-06-22 MED ORDER — CLONIDINE HCL 0.2 MG PO TABS: 0.2000 mg | ORAL_TABLET | Freq: Two times a day (BID) | ORAL | 0 refills | Status: DC

## 2018-06-22 MED ORDER — POLYETHYLENE GLYCOL 3350 17 G PO PACK: 17.0000 g | PACK | Freq: Every day | ORAL | 0 refills | Status: DC

## 2018-06-22 MED ORDER — PAROXETINE HCL 40 MG PO TABS: 40.0000 mg | ORAL_TABLET | Freq: Every day | ORAL | 0 refills | Status: DC

## 2018-06-22 MED ORDER — TRAZODONE HCL 100 MG PO TABS: 100.0000 mg | ORAL_TABLET | Freq: Every day | ORAL | 0 refills | Status: DC

## 2018-06-22 MED ORDER — GABAPENTIN 100 MG PO CAPS: 100.0000 mg | ORAL_CAPSULE | Freq: Every day | ORAL | 0 refills | Status: DC

## 2018-06-22 MED ORDER — HYDRALAZINE HCL 25 MG PO TABS: 25.0000 mg | ORAL_TABLET | Freq: Three times a day (TID) | ORAL | 0 refills | Status: DC

## 2018-06-22 MED ORDER — CARVEDILOL 3.125 MG PO TABS: 3.1250 mg | ORAL_TABLET | Freq: Two times a day (BID) | ORAL | 0 refills | Status: DC

## 2018-06-22 MED ORDER — MELATONIN 3 MG PO TABS: 6.0000 mg | ORAL_TABLET | Freq: Every day | ORAL | 0 refills | Status: DC

## 2018-06-22 MED ORDER — LIDOCAINE 5 % EX PTCH: MEDICATED_PATCH | CUTANEOUS | 0 refills | Status: DC

## 2018-06-22 NOTE — Progress Notes (Signed)
Patient and daughter received discharge instructions from Algis Liming, PA-C with verbal understanding. Patient discharged to home with daughter and belongings.

## 2018-06-22 NOTE — Discharge Instructions (Signed)
Inpatient Rehab Discharge Instructions  Gregory Hurley Discharge date and time: 06/22/18   Activities/Precautions/ Functional Status: Activity: no lifting, driving, or strenuous exercise for till cleared by MD Diet: low fat, low cholesterol diet Wound Care: keep wound clean and dry Contact MD if you develop any problems with your incision/wound--redness, swelling, increase in pain, drainage or if you develop fever or chills.     Functional status:  ___ No restrictions     ___ Walk up steps independently _X__ 24/7 supervision/assistance   ___ Walk up steps with assistance ___ Intermittent supervision/assistance  ___ Bathe/dress independently ___ Walk with walker     _X__ Bathe/dress with assistance ___ Walk Independently    ___ Shower independently _X__ Walk with supervision.     ___ Shower with assistance _X__ No alcohol     ___ Return to work/school ________    Special Instructions:    My questions have been answered and I understand these instructions. I will adhere to these goals and the provided educational materials after my discharge from the hospital.  Patient/Caregiver Signature _______________________________ Date __________  Clinician Signature _______________________________________ Date __________  Please bring this form and your medication list with you to all your follow-up doctor's appointments.

## 2018-06-22 NOTE — Discharge Summary (Signed)
Physician Discharge Summary  Patient ID: Gregory Hurley MRN: [id number] DOB/AGE: March 11, 1946 72 y.o.  Admit date: 06/07/2018 Discharge date: 06/22/2018  Discharge Diagnoses:  Principal Problem:   Central cord syndrome [facility]) Active Problems:   HTN (hypertension)   Hyponatremia   Drug induced constipation   Acute blood loss anemia   PTSD (post-traumatic stress disorder)   Tetraplegia (HCC)   Discharged Condition: stable   Significant Diagnostic Studies: N/A  Labs:  Basic Metabolic Panel:  BMP Latest Ref Rng & Units 06/20/2018 06/18/2018 06/13/2018  Glucose 70 - 99 mg/dL 93 91 93  BUN 8 - 23 mg/dL '14 13 13  '$ Creatinine 0.61 - 1.24 mg/dL 1.21 1.16 1.19  Sodium 135 - 145 mmol/L 138 137 136  Potassium 3.5 - 5.1 mmol/L 3.9 3.9 4.0  Chloride 98 - 111 mmol/L 108 107 105  CO2 22 - 32 mmol/L '25 25 25  '$ Calcium 8.9 - 10.3 mg/dL 8.4(L) 8.3(L) 8.4(L)   CBC: CBC Latest Ref Rng & Units 06/20/2018 06/18/2018 06/15/2018  WBC 4.0 - 10.5 K/uL 4.4 3.9(L) 4.8  Hemoglobin 13.0 - 17.0 g/dL 8.5(L) 8.1(L) 7.9(L)  Hematocrit 39.0 - 52.0 % 27.0(L) 25.9(L) 24.9(L)  Platelets 150 - 400 K/uL 164 181 196   CBG: No results for input(s): GLUCAP in the last 168 hours.  Brief HPI:   Gregory Hurley is a 72 year old male with history of CKD, HTN, PTSD who was admitted to [facility] on 05/31/2018 after fall with numbness and tingling of bilateral upper and bilateral lower extremity.   He reported feeling lightheaded followed by unresponsive episode and was able to activate EMS approximately 2 hours later.  UDS was positive for cocaine, opiates and THC.  Work-up revealed ankle spine with C6-C7 fractures with instability, cord contusion C5-C6,  diffuse epidural hematoma from foramen magnum to thoracic spinal cord, right gluteus maximus hematoma and severe centrilobular emphysema.  He was taken to the OR for C3-T1 PSF with decompression by Dr. Binnie Rail. Hospital course was significant for acute hospital  hypoxic respiratory failure with hypercarbia, hyponatremia, ABLA as well as issues with right ear pain.  He was he continues to be limited by tetraplegia as well as problems with processing.  CIR was recommended due to functional deficits.   Hospital Course: Gregory Hurley was admitted to rehab 06/07/2018 for inpatient therapies to consist of PT and OT at least three hours five days a week. Past admission physiatrist, therapy team and rehab RN have worked together to provide customized collaborative inpatient rehab.  He was maintained on Lovenox for DVT prophylaxis due to tetraplegia and BLE screening dopplers were negative for DVT.  Blood pressures were monitored on twice daily and were reasonably controlled on home meds.  Respiratory status has been stable and no hypoxia noted with increase in activity.  Posterior neck incision is healing well without signs or symptoms of infection. Ear pain has resolved and ciprodex was discontinued at discharge.     He was started on a bowel program with MiraLAX as well as suppository daily am and he has been educated on importance on consistent bowel program.  Mood has been stable with Paxil on board to help with mood stabilization/PTSD symptoms.  Follow-up labs showed acute blood loss anemia with hemoglobin of 7.9 and neutropenia with WBC- 3.9.   This has been monitored and with iron supplements twice daily.  Follow-up CBC shows improvement in H&H and neutropenia has resolved.  Be met has been monitored serially and hyponatremia is resolving.  P.o. intake has been good and pain has been controlled with addition of Neurontin at bedtime.  He is currently using Tylenol on as needed basis for pain management.  He has made steady progress during his rehab stay and is currently at min assist level.   Rehab course: During patient's stay in rehab weekly team conferences were held to monitor patient's progress, set goals and discuss barriers to discharge. At admission, patient  required max to total assist with ADLs and mod assist with mobility. Speech therapy evaluation evaluation revealed functional cognitive abilities with baseline memory deficits therefore ST not indicated during the stay. He  has had improvement in activity tolerance, balance, postural control as well as ability to compensate for deficits. He has had improvement in functional use RUE/LUE  and RLE/LLE as well as improvement in awareness.  He is able to complete ADL tasks with increased time and supervision.  He does require assistance for clothing management and close supervision for safety with sit to stand.  He requires min assistance to don socks.  He requires supervision with verbal cues for transfers and to ambulate 150 feet with rolling walker.  He requires supervision to navigate 12 stairs with 2 railings.  Family education was completed with daughter regarding all aspects of care and safety.  Disposition:  Home  Diet: Low-fat low-cholesterol.    Special Instructions: 1. Continue daily bowel program. 2. No driving, lifting items over 5 pounds or strenuous activity until cleared by MD. 3.Keep neck incision clean and dry.  Contact MD for any problems with wound or for drain drainage, redness or increasing pain.   Discharge Instructions    Ambulatory referral to Physical Medicine Rehab   Complete by:  As directed    1-2 weeks transitional care appt     Allergies as of 06/22/2018      Reactions   Ace Inhibitors    Other reaction(s): Angioedema      Medication List    STOP taking these medications   aspirin 325 MG tablet   ciprofloxacin-dexamethasone OTIC suspension Commonly known as:  CIPRODEX   oxyCODONE 5 MG immediate release tablet Commonly known as:  Oxy IR/ROXICODONE   sennosides-docusate sodium 8.6-50 MG tablet Commonly known as:  SENOKOT-S     TAKE these medications   acetaminophen 325 MG tablet Commonly known as:  TYLENOL Take 1-2 tablets (325-650 mg total) by mouth  every 4 (four) hours as needed for mild pain. What changed:    how much to take  when to take this  reasons to take this   albuterol 108 (90 Base) MCG/ACT inhaler Commonly known as:  PROVENTIL HFA;VENTOLIN HFA Inhale into the lungs every 6 (six) hours as needed for wheezing or shortness of breath.   amLODipine 10 MG tablet Commonly known as:  NORVASC Take 1 tablet (10 mg total) by mouth daily.   bisacodyl 10 MG suppository Commonly known as:  DULCOLAX Place 1 suppository (10 mg total) rectally daily at 6 (six) AM. What changed:    when to take this  reasons to take this   carvedilol 3.125 MG tablet Commonly known as:  COREG Take 1 tablet (3.125 mg total) by mouth 2 (two) times daily with a meal.   cloNIDine 0.2 MG tablet Commonly known as:  CATAPRES Take 1 tablet (0.2 mg total) by mouth 2 (two) times daily.   gabapentin 100 MG capsule Commonly known as:  NEURONTIN Take 1 capsule (100 mg total) by mouth at bedtime.  hydrALAZINE 25 MG tablet Commonly known as:  APRESOLINE Take 1 tablet (25 mg total) by mouth every 8 (eight) hours.   latanoprost 0.005 % ophthalmic solution Commonly known as:  XALATAN Place 1 drop into both eyes at bedtime.   lidocaine 5 % Commonly known as:  LIDODERM Apply to left shoulder at 7 am and remove at 7 pm daily.  PURCHASE THIS OVER THE COUNTER. What changed:    how much to take  how to take this  when to take this  additional instructions   Melatonin 3 MG Tabs Take 2 tablets (6 mg total) by mouth at bedtime. IS OVER THE COUNTER. USED FOR SLEEP What changed:    how much to take  additional instructions   PARoxetine 40 MG tablet Commonly known as:  PAXIL Take 1 tablet (40 mg total) by mouth daily.   polyethylene glycol packet Commonly known as:  MIRALAX / GLYCOLAX Take 17 g by mouth daily.   traZODone 100 MG tablet Commonly known as:  DESYREL Take 1 tablet (100 mg total) by mouth at bedtime.   vitamin B-12 1000  MCG tablet Commonly known as:  CYANOCOBALAMIN Take 1,000 mcg by mouth daily.      Follow-up Information    Meredith Staggers, MD Follow up.   Specialty:  Physical Medicine and Rehabilitation Why:  office will call you with follow up appointment Contact information: [place] [address] [phone]        Dallie Piles, MD. Call in 1 day(s).   Specialty:  Orthopedic Surgery Why:  for post op appointment Contact information: [place] [address] [phone]        Merry Proud, PA-C. Call in 1 day(s).   Specialty:  Physician Assistant Why:  for post hospital follow up in a couple of weeks. Needs Hemocult repeated to monitor for occult blood Contact information: [place] [address] [phone]           Signed: Bary Leriche 06/25/2018, 4:14 PM

## 2018-06-22 NOTE — Progress Notes (Signed)
[facility]     PROGRESS NOTE    Subjective/Complaints: Cont of bladder this am on toilet  ROS: denies CP, SOB, nausea, vomiting, diarrhea.  Objective:  No results found. Recent Labs    06/20/18 0526  WBC 4.4  HGB 8.5*  HCT 27.0*  PLT 164   Recent Labs    06/20/18 0526  NA 138  K 3.9  CL 108  GLUCOSE 93  BUN 14  CREATININE 1.21  CALCIUM 8.4*   CBG (last 3)  No results for input(s): GLUCAP in the last 72 hours.  Wt Readings from Last 3 Encounters:  06/07/18 75.8 kg (167 lb)  06/07/18 75.8 kg (167 lb)  05/31/18 77.1 kg (170 lb)     Intake/Output Summary (Last 24 hours) at 06/22/2018 0921 Last data filed at 06/22/2018 0530 Gross per 24 hour  Intake 480 ml  Output 1150 ml  Net -670 ml    Vital Signs: Blood pressure (!) 152/70, pulse 63, temperature 98.1 F (36.7 C), temperature source Oral, resp. rate 17, height 5\' 11"  (1.803 m), weight 75.8 kg (167 lb), SpO2 100 %. Physical Exam:  Constitutional: No distress . Vital signs reviewed. HENT: Normocephalic.  Atraumatic.   Neck: + Soft collar Eyes: EOMI. No discharge. Cardiovascular: RRR. No JVD. Respiratory: CTA Bilaterally. Normal effort. GI: BS +. Non-distended. Musc: No edema or tenderness in extremities. Neurologic Alert.   Motor: right upper extremity is shoulder abduction 4-/5, elbow flexion 3-/5, elbow extension, wrist extension, handgrip 2/5  Left upper extremity is 4/5 left deltoid and biceps, 3-/5 triceps, finger flexors, wrist extensors, hand intrinsics Sensation identifies LT in BUE Bilateral lower extremities 4-/5 proximal distal. Psychiatric: pleasant. Normal mood. Normal behavior.  Assessment/Plan: 1.  Functional and mobility deficits secondary to cervical central cord syndrome  Stable for D/C today F/u PCP in 3-4 weeks F/u spine surgery 1-2 wk [facility]  F/u PM&R 2 weeks See D/C summary See D/C instructions  Function:  Bathing Bathing position    Position: Wheelchair/chair at sink  Bathing parts Body parts bathed by patient: Right arm, Chest, Abdomen, Front perineal area, Right upper leg, Left upper leg, Left arm, Buttocks, Right lower leg, Left lower leg Body parts bathed by helper: Back  Bathing assist Assist Level: Supervision or verbal cues      Upper Body Dressing/Undressing Upper body dressing   What is the patient wearing?: Pull over shirt/dress, Orthosis     Pull over shirt/dress - Perfomed by patient: Thread/unthread left sleeve, Put head through opening, Thread/unthread right sleeve, Pull shirt over trunk Pull over shirt/dress - Perfomed by helper: Pull shirt over trunk     Orthosis activity level: Performed by helper  Upper body assist Assist Level: Supervision or verbal cues      Lower Body Dressing/Undressing Lower body dressing   What is the patient wearing?: Pants, Underwear, Socks, Shoes Underwear - Performed by patient: Thread/unthread right underwear leg, Thread/unthread left underwear leg, Pull underwear up/down   Pants- Performed by patient: Thread/unthread right pants leg, Thread/unthread left pants leg, Pull pants up/down Pants- Performed by helper: Pull pants up/down   Non-skid slipper socks- Performed by helper: Don/doff right sock, Don/doff left sock   Socks - Performed by helper: Don/doff right sock, Don/doff left sock Shoes - Performed by patient: Don/doff right shoe, Don/doff left shoe, Fasten right, Fasten left         TED Hose - Performed by helper: Don/doff right TED hose, Don/doff left TED hose  Lower body assist Assist for lower body dressing: Touching or steadying assistance (Pt > 75%)      Toileting Toileting Toileting activity did not occur: Safety/medical concerns Toileting steps completed by patient: Adjust clothing prior to toileting, Adjust clothing after toileting Toileting steps completed by helper: Performs perineal hygiene Toileting Assistive Devices: Grab bar or rail   Toileting assist Assist level: (mod A)   Transfers Chair/bed transfer Chair/bed transfer activity did not occur: N/A Chair/bed transfer method: Stand pivot Chair/bed transfer assist level: Supervision or verbal cues Chair/bed transfer assistive device: Armrests, Environmental manager lift: Chiropractor     Max distance: 150 Assist level: Supervision or verbal cues   Wheelchair   Type: Manual Max wheelchair distance: 50' Assist Level: Supervision or verbal cues  Cognition Comprehension Comprehension assist level: Understands basic 90% of the time/cues < 10% of the time  Expression Expression assist level: Expresses basic 90% of the time/requires cueing < 10% of the time.  Social Interaction Social Interaction assist level: Interacts appropriately 90% of the time - Needs monitoring or encouragement for participation or interaction.  Problem Solving Problem solving assist level: Solves basic 90% of the time/requires cueing < 10% of the time  Memory Memory assist level: Recognizes or recalls 90% of the time/requires cueing < 10% of the time  Medical Problem List and Plan: 1. Tetraparesis, secondary to Central cord syndrome  Continue CIR, PT, OT,stable for D/C  Resting WHO RUE at night 2. DVT Prophylaxis/Anticoagulation: Pharmaceutical:Lovenox 3. Pain Management:Oxycodone prn. Continue Neurontin at bedtime 4. Mood:LCSW to follow for evaluation and support. 5. Neuropsych: This patientiscapable of making decisions on hisown behalf. 6. Skin/Wound Care:routine pressure relief measures. 7. Fluids/Electrolytes/Nutrition:improved fluid intake 8. HTN: Poorly controlled at baseline with SBP up to 200's. Monitor BID. Continue norvasc, coreg bid and clonidine  Hydralazine 25 3 times a day started on 6/30, monitor BPs   Vitals:   06/21/18 2131 06/22/18 0539  BP: (!) 158/98 (!) 152/70  Pulse: 61 63  Resp: 20 17  Temp: 98.1 F (36.7 C) 98.1 F (36.7 C)  SpO2:  100% 100%  Good control this am 7/4 9. Right ear infection?:   Diagnosed PTA per patient  Continue ciprodex .  10. Constipation: continue Miralax daily and  suppository daily.  11. PTSD with anxiety/depression/nightmares: Managed with Paxil for mood stabilization and trazodone/melatonin for sleep.  12. Emphysema: On albuterol prn. 13. ABLA: Continue to monitor-   -hgb 7.9 on 6/28, 8.1 on 7/1, improved to 8.5 on 7/4  -Iron supplementation  -Hemoccult positive, continue to monitor- OP follow up recommended    LOS (Days) [place] EVALUATION WAS PERFORMED  Charlett Blake, MD 06/22/2018 9:21 AM

## 2018-06-22 NOTE — Progress Notes (Signed)
Social Work  Discharge Note  The overall goal for the admission was met for:   Discharge location: Yes - home with daughter in [place]  Length of Stay: Yes - 15 days  Discharge activity level: Yes - supervision overall  Home/community participation: Yes  Services provided included: MD, RD, PT, OT, RN, TR, Pharmacy and SW  Financial Services: Medicare  Follow-up services arranged: Outpatient: PT, OT via [facility], DME: rolling walker, tub bench via [facility] and Patient/Family has no preference for HH/DME agencies  Comments (or additional information):  Patient/Family verbalized understanding of follow-up arrangements: Yes  Individual responsible for coordination of the follow-up plan: pt  Confirmed correct DME delivered: HOYLE, LUCY 06/22/2018    HOYLE, LUCY 

## 2018-06-25 ENCOUNTER — Telehealth: Payer: Self-pay | Admitting: Registered Nurse

## 2018-06-25 DIAGNOSIS — F4310 Post-traumatic stress disorder, unspecified: Secondary | ICD-10-CM

## 2018-06-25 DIAGNOSIS — G8250 Quadriplegia, unspecified: Secondary | ICD-10-CM

## 2018-06-25 NOTE — Telephone Encounter (Signed)
Transitional Care call Transitional Care Call Questions Answered by Daughter [name]  Patient name: DOB: Gregory Hurley  1. Are you/is patient experiencing any problems since coming home? No a. Are there any questions regarding any aspect of care? No 2. Are there any questions regarding medications administration/dosing? No a. Are meds being taken as prescribed? Yes b. "Patient should review meds with caller to confirm"  Medication List Reviewed 3. Have there been any falls? No 4. Has Home Health been to the house and/or have they contacted you? Attending Outpatient Rehabilitation at [facility]. a. If not, have you tried to contact them? No b. Can we help you contact them? NA 5. Are bowels and bladder emptying properly? Yes a. Are there any unexpected incontinence issues? No b. If applicable, is patient following bowel/bladder programs? NA 6. Any fevers, problems with breathing, unexpected pain? No 7. Are there any skin problems or new areas of breakdown? No 8. Has the patient/family member arranged specialty MD follow up (ie cardiology/neurology/renal/surgical/etc.)?  Ms. Frutoso Chase will be scheduling follow up appointments in the morning she states,  a. Can we help arrange? NA 9. Does the patient need any other services or support that we can help arrange? No 10. Are caregivers following through as expected in assisting the patient? Yes 11. Has the patient quit smoking, drinking alcohol, or using drugs as recommended? Ms. [name] states her father doesn't smoke, drink alcohol or use illicit drugs.   Appointment date/time 07/04/2018, arrival time 10:40 for 11:00 appointment  With [facility] ANP. At [place]

## 2018-07-04 ENCOUNTER — Encounter: Payer: Self-pay | Admitting: Registered Nurse

## 2018-07-04 ENCOUNTER — Telehealth: Payer: Self-pay

## 2018-07-04 ENCOUNTER — Encounter: Payer: Medicare HMO | Attending: Registered Nurse | Admitting: Registered Nurse

## 2018-07-04 VITALS — BP 122/75 | HR 64 | Resp 14 | Ht 71.0 in | Wt 166.0 lb

## 2018-07-04 DIAGNOSIS — F17210 Nicotine dependence, cigarettes, uncomplicated: Secondary | ICD-10-CM | POA: Insufficient documentation

## 2018-07-04 DIAGNOSIS — I10 Essential (primary) hypertension: Secondary | ICD-10-CM

## 2018-07-04 DIAGNOSIS — Z803 Family history of malignant neoplasm of breast: Secondary | ICD-10-CM | POA: Diagnosis not present

## 2018-07-04 DIAGNOSIS — I129 Hypertensive chronic kidney disease with stage 1 through stage 4 chronic kidney disease, or unspecified chronic kidney disease: Secondary | ICD-10-CM | POA: Diagnosis not present

## 2018-07-04 DIAGNOSIS — S14129A Central cord syndrome at unspecified level of cervical spinal cord, initial encounter: Secondary | ICD-10-CM | POA: Diagnosis not present

## 2018-07-04 DIAGNOSIS — F4310 Post-traumatic stress disorder, unspecified: Secondary | ICD-10-CM | POA: Diagnosis not present

## 2018-07-04 DIAGNOSIS — Z8042 Family history of malignant neoplasm of prostate: Secondary | ICD-10-CM | POA: Diagnosis not present

## 2018-07-04 DIAGNOSIS — N183 Chronic kidney disease, stage 3 (moderate): Secondary | ICD-10-CM | POA: Insufficient documentation

## 2018-07-04 DIAGNOSIS — Z800 Family history of malignant neoplasm of digestive organs: Secondary | ICD-10-CM | POA: Diagnosis not present

## 2018-07-04 DIAGNOSIS — X58XXXD Exposure to other specified factors, subsequent encounter: Secondary | ICD-10-CM | POA: Diagnosis not present

## 2018-07-04 DIAGNOSIS — S14129D Central cord syndrome at unspecified level of cervical spinal cord, subsequent encounter: Secondary | ICD-10-CM | POA: Diagnosis not present

## 2018-07-04 DIAGNOSIS — M961 Postlaminectomy syndrome, not elsewhere classified: Secondary | ICD-10-CM | POA: Diagnosis not present

## 2018-07-04 NOTE — Telephone Encounter (Signed)
Gregory Hurley from [facility] called stating that they did get a referral for pt but was contacted by Lorre Nick from Inpatient Rehab to disregard the referral because pt was going to Outpatient Rehab. She states according to Epic pt setup with [facility].

## 2018-07-04 NOTE — Progress Notes (Signed)
Subjective:    Patient ID: Gregory Hurley, male    DOB: 22-Jun-1946, 72 y.o.   MRN: [id number]  HPI; [name] is a 72 year old male who is here for transitional care visit in follow up of his  Central cord syndrome, s/p cervical post- laminectomy wearing soft neck collar, PTSD and HTN.  He has been home, awaiting Therapy. [name] daughter Gregory Hurley reports she has been calling [facility], she spoke to a Jolinda Croak from [place] stating [name] orders will be transferred to [facility] since he's living with his daughter. This provider placed a call to [facility] and spoke with Joen Laura who reports Lennart Pall told her to cancel their order with [facility] and he will be going to out patient therapy, she states. According to [place] [facility] discharge note therapy was scheduLed  with [facility] outpatient rehabilitation. This provider sent an e-mail to [facility] regarding the above, she Lorre Nick) has taken care of the therapy orderes and she spoke with Shelby Mattocks Mr. Bosher daughter per e-mail.   [name] denies any pain and reports he has a good appetite.   Daughter Gregory Hurley in room, all questions answered.    Pain Inventory Average Pain 0 Pain Right Now 0 My pain is tingling  In the last 24 hours, has pain interfered with the following? General activity 0 Relation with others 0 Enjoyment of life 0 What TIME of day is your pain at its worst? evening Sleep (in general) Fair  Pain is worse with: sitting Pain improves with: medication Relief from Meds: 6  Mobility walk with assistance use a walker how many minutes can you walk? 5 ability to climb steps?  yes do you drive?  no use a wheelchair Do you have any goals in this area?  yes  Function retired I need assistance with the following:  dressing, bathing, toileting, meal prep and household duties  Neuro/Psych weakness tingling trouble walking dizziness  Prior Studies Any changes since last visit?  no  Physicians involved  in your care Any changes since last visit?  no   Family History  Problem Relation Age of Onset  . Breast cancer Mother   . Colon cancer Father   . Prostate cancer Father    Social History   Socioeconomic History  . Marital status: Single    Spouse name: Not on file  . Number of children: Not on file  . Years of education: Not on file  . Highest education level: Not on file  Occupational History  . Not on file  Social Needs  . Financial resource strain: Not on file  . Food insecurity:    Worry: Not on file    Inability: Not on file  . Transportation needs:    Medical: Not on file    Non-medical: Not on file  Tobacco Use  . Smoking status: Current Every Day Smoker    Packs/day: 1.00    Years: 50.00    Pack years: 50.00    Types: Cigars  . Smokeless tobacco: Never Used  . Tobacco comment: 29 PACK YR HX SMOKING SO HIGH RISK LUNG CANCER  Substance and Sexual Activity  . Alcohol use: Yes    Alcohol/week: 0.0 oz    Comment: "reports no alcohol use in 5-7 days"  . Drug use: Yes    Comment: H/X OF CANNABIS ABUSE  . Sexual activity: Not on file  Lifestyle  . Physical activity:    Days per week: Not on  file    Minutes per session: Not on file  . Stress: Not on file  Relationships  . Social connections:    Talks on phone: Not on file    Gets together: Not on file    Attends religious service: Not on file    Active member of club or organization: Not on file    Attends meetings of clubs or organizations: Not on file    Relationship status: Not on file  Other Topics Concern  . Not on file  Social History Narrative  . Not on file   Past Surgical History:  Procedure Laterality Date  . NO PAST SURGERIES     Past Medical History:  Diagnosis Date  . Anemia   . CKD (chronic kidney disease), stage III ([place])   . Elevated alkaline phosphatase level   . History of fracture of clavicle   . History of fracture of rib   . Hypertension   . Pancytopenia ([place])   . PTSD  (post-traumatic stress disorder)    FOLLOWED AT [facility]  . Thrombocytopenia (HCC)    BP 122/75 (BP Location: Left Arm, Patient Position: Sitting, Cuff Size: Normal)   Pulse (!) 59   Resp 14   Ht 5\' 11"  (1.803 m)   Wt 166 lb (75.3 kg)   SpO2 98%   BMI 23.15 kg/m   Opioid Risk Score:   Fall Risk Score:  `1  Depression screen PHQ 2/9  Depression screen PHQ 2/9 07/04/2018  Decreased Interest 1  Down, Depressed, Hopeless 1  PHQ - 2 Score 2  Altered sleeping 2  Tired, decreased energy 2  Change in appetite 1  Feeling bad or failure about yourself  0  Trouble concentrating 1  Moving slowly or fidgety/restless 0  Suicidal thoughts 0  PHQ-9 Score 8  Difficult doing work/chores Somewhat difficult    Review of Systems  Constitutional: Negative.   HENT: Negative.   Eyes: Negative.   Respiratory: Negative.   Cardiovascular: Negative.   Gastrointestinal: Negative.   Endocrine: Negative.        High blood sugar  Genitourinary: Negative.   Musculoskeletal: Positive for gait problem.  Skin: Negative.   Allergic/Immunologic: Negative.   Neurological: Positive for dizziness and weakness.       Tingling   Hematological: Negative.        Objective:   Physical Exam  Constitutional: He is oriented to person, place, and time. He appears well-developed and well-nourished.  HENT:  Head: Normocephalic and atraumatic.  Neck: Normal range of motion. Neck supple.  Cardiovascular: Normal rate and regular rhythm.  Pulmonary/Chest: Effort normal and breath sounds normal.  Musculoskeletal:  Normal Muscle Bulk and Muscle Testing Reveals: Upper Extremities: Full ROM and Muscle Strength 5/5 Lower Extremities: Full ROM and Muscle Strength 5/5 Arises from Table with ease using walker for support Gait Steady with Normal Steps   Neurological: He is alert and oriented to person, place, and time.  Skin: Skin is warm and dry.  Psychiatric: He has a normal mood and affect. His behavior is  normal.  Nursing note and vitals reviewed.         Assessment & Plan:  1. Central Cord Syndrome/ S/P Cervical Post-Laminectomy: Awaiting Physical Therapy: F/U with Dr. Hetty Blend. 2. HTN: Continue current medication regimen. PCP Following.  3. PTSD: Continue Paxil: PCP Following.   40 minutes of face to face patient care time was spent during this visit. All questions were encouraged and answered.  F/U with  Dr. Naaman Plummer in 4-6 weeks.

## 2018-07-06 DIAGNOSIS — Z9181 History of falling: Secondary | ICD-10-CM | POA: Diagnosis not present

## 2018-07-06 DIAGNOSIS — R29898 Other symptoms and signs involving the musculoskeletal system: Secondary | ICD-10-CM | POA: Diagnosis not present

## 2018-07-06 DIAGNOSIS — M25642 Stiffness of left hand, not elsewhere classified: Secondary | ICD-10-CM | POA: Diagnosis not present

## 2018-07-06 DIAGNOSIS — R208 Other disturbances of skin sensation: Secondary | ICD-10-CM | POA: Diagnosis not present

## 2018-07-06 DIAGNOSIS — M25641 Stiffness of right hand, not elsewhere classified: Secondary | ICD-10-CM | POA: Diagnosis not present

## 2018-07-06 DIAGNOSIS — G8254 Quadriplegia, C5-C7 incomplete: Secondary | ICD-10-CM | POA: Diagnosis not present

## 2018-07-06 DIAGNOSIS — R279 Unspecified lack of coordination: Secondary | ICD-10-CM | POA: Diagnosis not present

## 2018-07-18 DIAGNOSIS — M25642 Stiffness of left hand, not elsewhere classified: Secondary | ICD-10-CM | POA: Diagnosis not present

## 2018-07-18 DIAGNOSIS — R208 Other disturbances of skin sensation: Secondary | ICD-10-CM | POA: Diagnosis not present

## 2018-07-18 DIAGNOSIS — R269 Unspecified abnormalities of gait and mobility: Secondary | ICD-10-CM | POA: Diagnosis not present

## 2018-07-18 DIAGNOSIS — M25641 Stiffness of right hand, not elsewhere classified: Secondary | ICD-10-CM | POA: Diagnosis not present

## 2018-07-18 DIAGNOSIS — G8254 Quadriplegia, C5-C7 incomplete: Secondary | ICD-10-CM | POA: Diagnosis not present

## 2018-07-18 DIAGNOSIS — R279 Unspecified lack of coordination: Secondary | ICD-10-CM | POA: Diagnosis not present

## 2018-07-18 DIAGNOSIS — Z9181 History of falling: Secondary | ICD-10-CM | POA: Diagnosis not present

## 2018-07-18 DIAGNOSIS — R29898 Other symptoms and signs involving the musculoskeletal system: Secondary | ICD-10-CM | POA: Diagnosis not present

## 2018-08-01 ENCOUNTER — Encounter: Payer: Medicare HMO | Attending: Registered Nurse | Admitting: Physical Medicine & Rehabilitation

## 2018-08-01 DIAGNOSIS — S14129D Central cord syndrome at unspecified level of cervical spinal cord, subsequent encounter: Secondary | ICD-10-CM | POA: Insufficient documentation

## 2018-08-01 DIAGNOSIS — Z803 Family history of malignant neoplasm of breast: Secondary | ICD-10-CM | POA: Insufficient documentation

## 2018-08-01 DIAGNOSIS — N183 Chronic kidney disease, stage 3 (moderate): Secondary | ICD-10-CM | POA: Insufficient documentation

## 2018-08-01 DIAGNOSIS — M961 Postlaminectomy syndrome, not elsewhere classified: Secondary | ICD-10-CM | POA: Insufficient documentation

## 2018-08-01 DIAGNOSIS — Z8042 Family history of malignant neoplasm of prostate: Secondary | ICD-10-CM | POA: Insufficient documentation

## 2018-08-01 DIAGNOSIS — F4310 Post-traumatic stress disorder, unspecified: Secondary | ICD-10-CM | POA: Insufficient documentation

## 2018-08-01 DIAGNOSIS — I129 Hypertensive chronic kidney disease with stage 1 through stage 4 chronic kidney disease, or unspecified chronic kidney disease: Secondary | ICD-10-CM | POA: Insufficient documentation

## 2018-08-01 DIAGNOSIS — Z800 Family history of malignant neoplasm of digestive organs: Secondary | ICD-10-CM | POA: Insufficient documentation

## 2018-08-01 DIAGNOSIS — F17210 Nicotine dependence, cigarettes, uncomplicated: Secondary | ICD-10-CM | POA: Insufficient documentation

## 2018-09-10 DIAGNOSIS — R310 Gross hematuria: Secondary | ICD-10-CM | POA: Diagnosis not present

## 2018-09-18 DIAGNOSIS — M4322 Fusion of spine, cervical region: Secondary | ICD-10-CM | POA: Diagnosis not present

## 2018-09-18 DIAGNOSIS — S14124D Central cord syndrome at C4 level of cervical spinal cord, subsequent encounter: Secondary | ICD-10-CM | POA: Diagnosis not present

## 2018-09-18 DIAGNOSIS — M542 Cervicalgia: Secondary | ICD-10-CM | POA: Diagnosis not present

## 2018-09-18 DIAGNOSIS — G8929 Other chronic pain: Secondary | ICD-10-CM | POA: Diagnosis not present

## 2018-09-18 DIAGNOSIS — Z981 Arthrodesis status: Secondary | ICD-10-CM | POA: Diagnosis not present

## 2018-09-18 DIAGNOSIS — G629 Polyneuropathy, unspecified: Secondary | ICD-10-CM | POA: Diagnosis not present

## 2018-09-18 DIAGNOSIS — G8250 Quadriplegia, unspecified: Secondary | ICD-10-CM | POA: Diagnosis not present

## 2018-09-18 DIAGNOSIS — M545 Low back pain: Secondary | ICD-10-CM | POA: Diagnosis not present

## 2018-09-18 DIAGNOSIS — M4810 Ankylosing hyperostosis [Forestier], site unspecified: Secondary | ICD-10-CM | POA: Diagnosis not present

## 2018-09-18 DIAGNOSIS — M47812 Spondylosis without myelopathy or radiculopathy, cervical region: Secondary | ICD-10-CM | POA: Diagnosis not present

## 2019-02-11 ENCOUNTER — Telehealth: Payer: Self-pay | Admitting: Physical Medicine & Rehabilitation

## 2019-06-02 IMAGING — CT CT T SPINE W/O CM
3 series · 11 of 33 positions shown, 13 images · non-contrast
Comparison: None.

CLINICAL DATA: Thoracolumbar pain.

EXAM:
CT THORACIC SPINE WITHOUT CONTRAST
TECHNIQUE: Multidetector CT images of the thoracic were obtained using the
standard protocol without intravenous contrast.

[Series 4: t spine soft · axial · 0.38mm/px · z∈[-382,-222]mm · 3 of 132 slices shown, 4 images]
[im 31/132  soft-tissue]
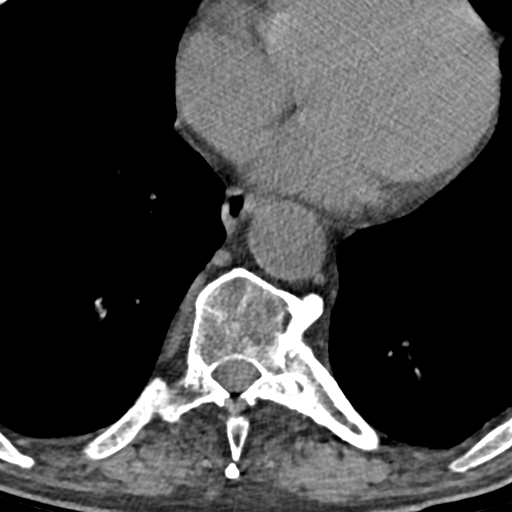
[im 31/132  bone]
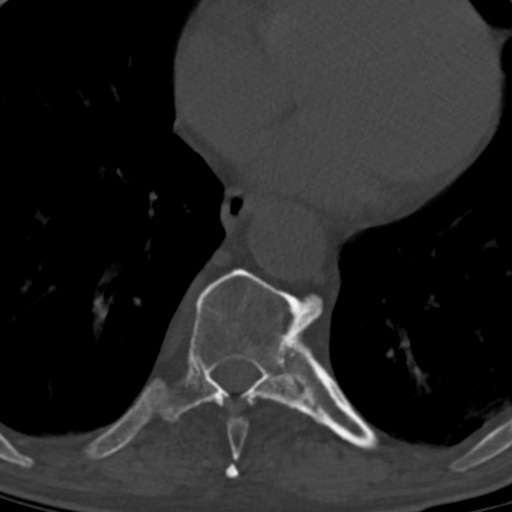
[im 71/132  bone]
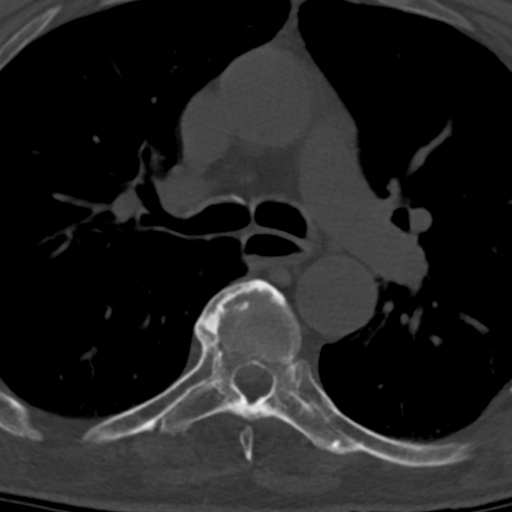
[im 111/132  bone]
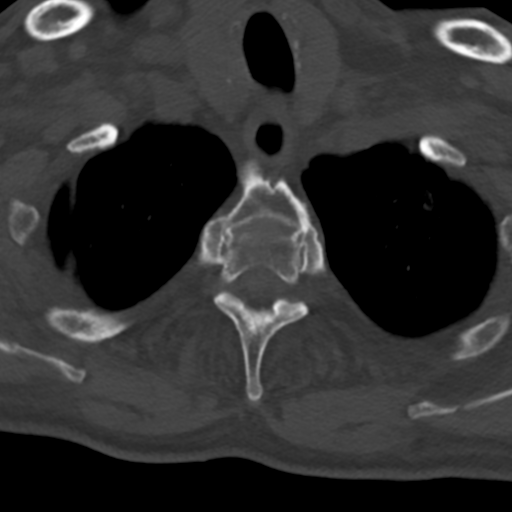

[Series 5: sagittal bone · sagittal · 0.39mm/px · 5 of 54 slices shown, 6 images]
[im 18/54  bone]
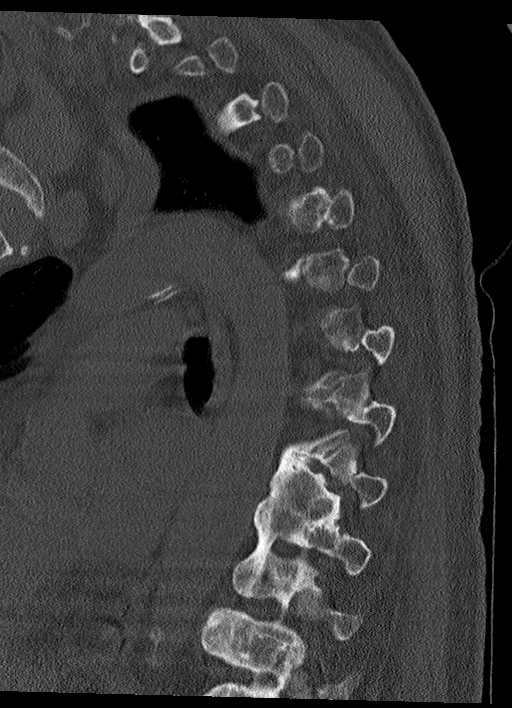
[im 23/54  bone]
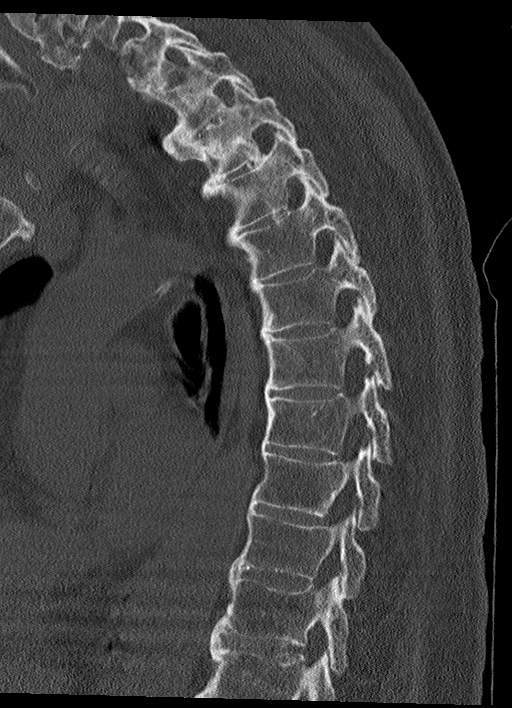
[im 27/54  soft-tissue]
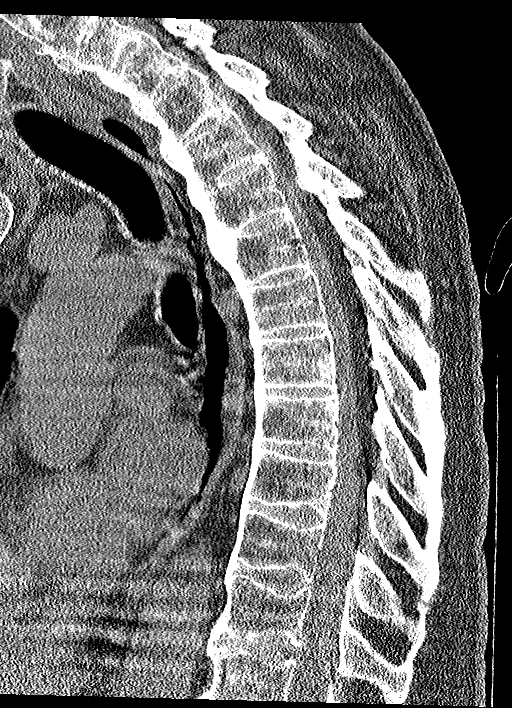
[im 27/54  bone]
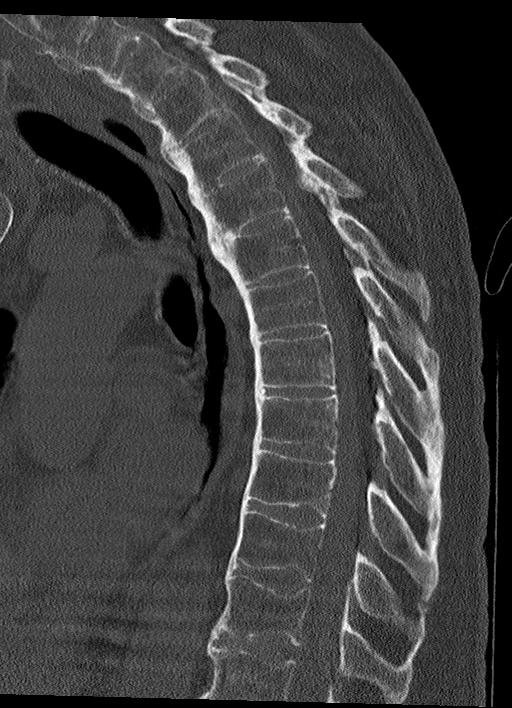
[im 31/54  bone]
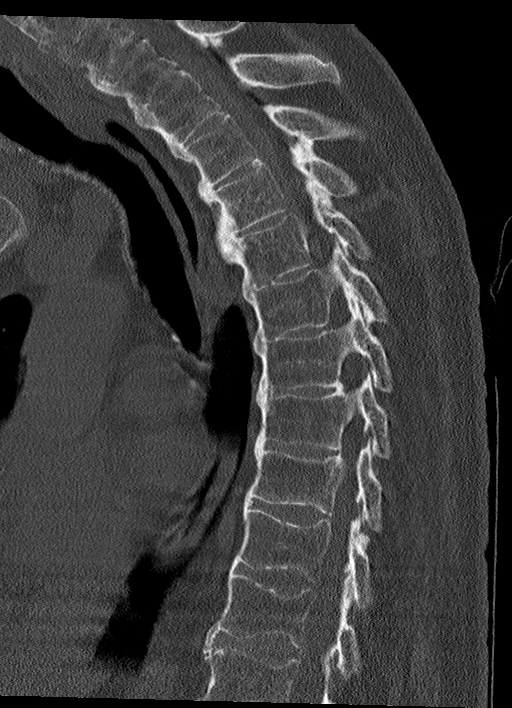
[im 36/54  bone]
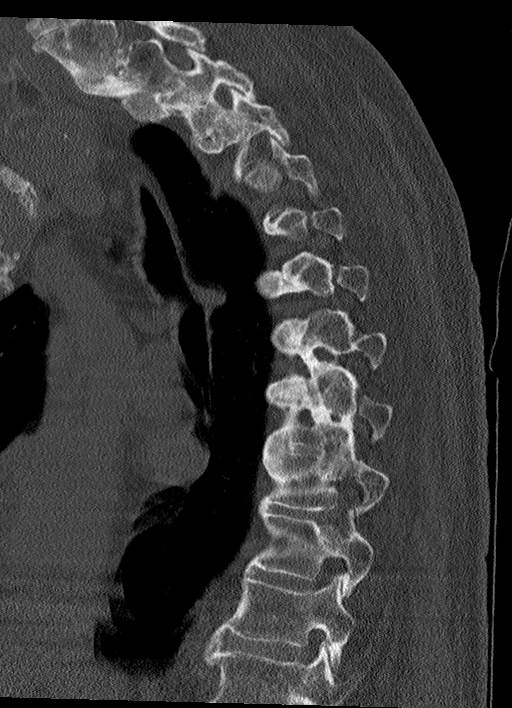

[Series 6: coronal bone · coronal · 0.21mm/px · 3 of 73 slices shown]
[im 15/73  bone]
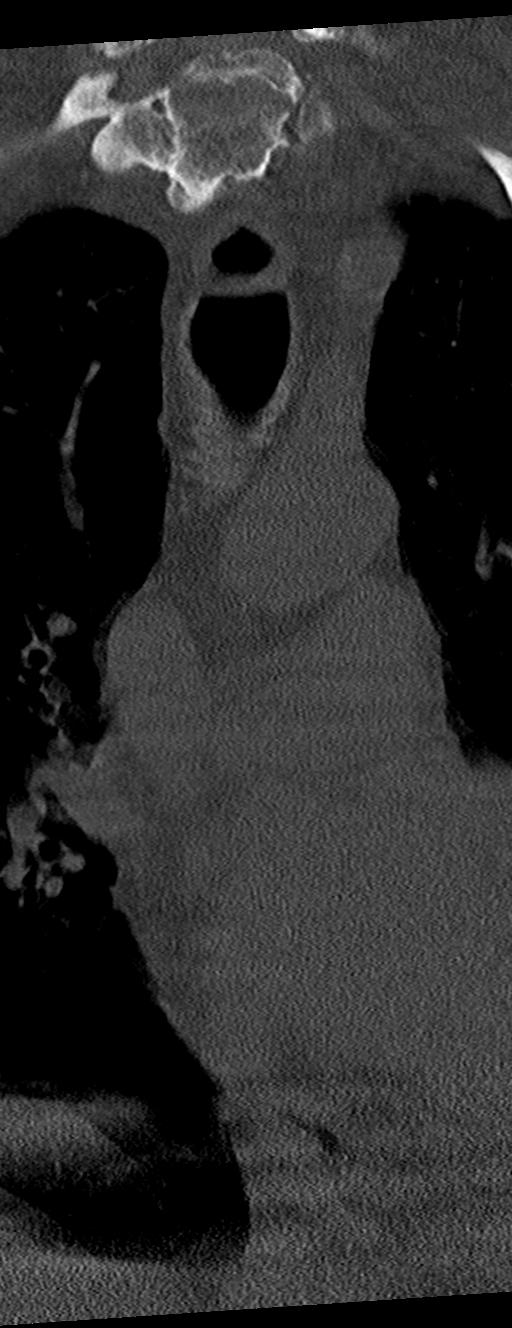
[im 29/73  bone]
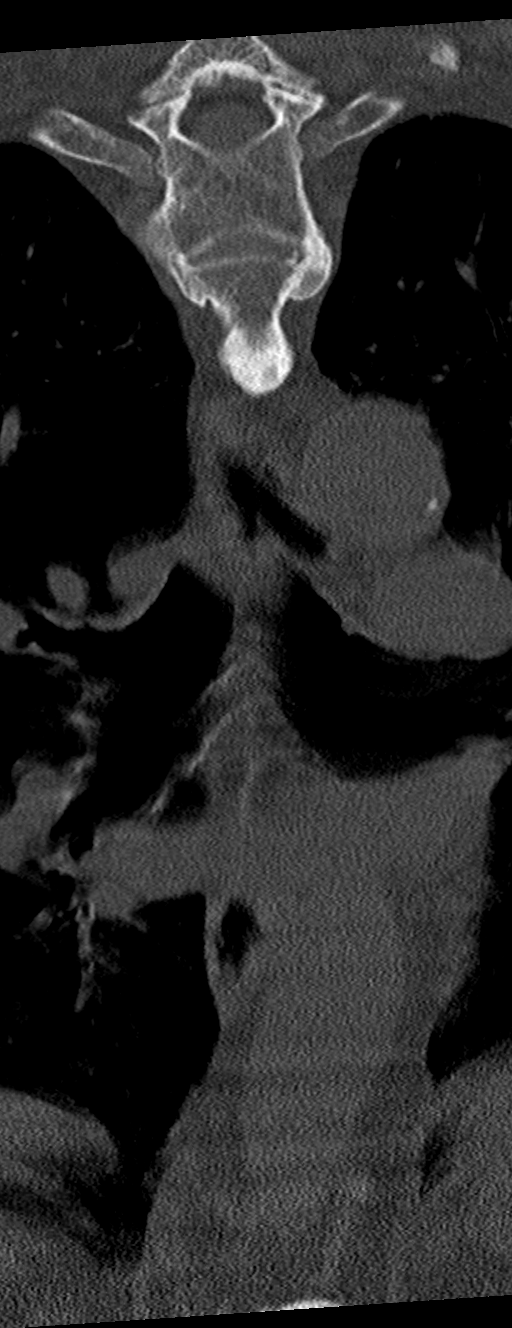
[im 44/73  bone]
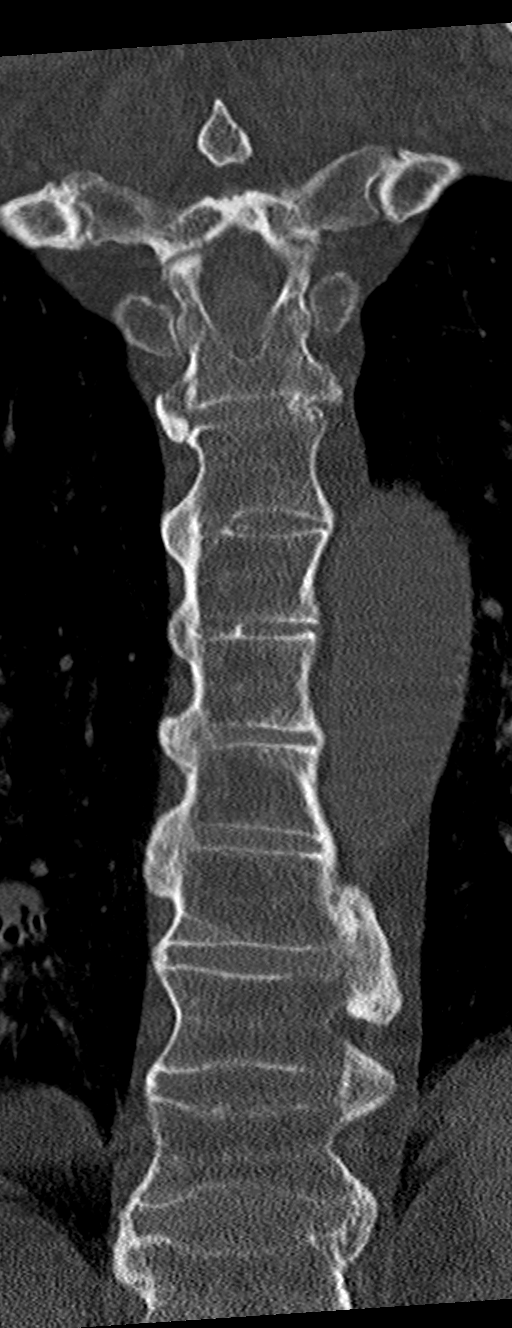

[11 of 33 positions shown; findings below may reference images not displayed]

FINDINGS: Alignment: Normal.

Vertebrae: No acute fracture or focal pathologic process.

Paraspinal and other soft tissues: No acute paraspinal abnormality.

Other: Aortic atherosclerosis. Coronary artery atherosclerosis in
the LAD. No focal consolidation or pleural effusion. Bilateral
centrilobular emphysema.

Disc levels: Syndesmophytes throughout the thoracic spine with more
bulky anterior bridging osteophytes of the cervicothoracic spine.
Ankylosis of the lower thoracic spinous processes. Partial ankylosis
of posterior elements from T2 through T6. No foraminal stenosis.
IMPRESSION: 1.  No acute osseous injury of the thoracic spine..
2. Findings suggestive of ankylosing spondylitis.

## 2019-06-02 IMAGING — CT CT HEAD W/O CM
4 of 8 series · 15 of 47 positions shown, 17 images · non-contrast
Comparison: Head and cervical spine CT 09/18/2009

CLINICAL DATA: Fall this afternoon with headache left-sided prior
to fall.

EXAM:
CT HEAD WITHOUT CONTRAST
CT CERVICAL SPINE WITHOUT CONTRAST
TECHNIQUE: Multidetector CT imaging of the head and cervical spine was
performed following the standard protocol without intravenous
contrast. Multiplanar CT image reconstructions of the cervical spine
were also generated.

[Series 3: head wo · axial · 0.43mm/px · z∈[-67,+38]mm · 6 of 31 slices shown, 8 images]
[im 5/31  brain]
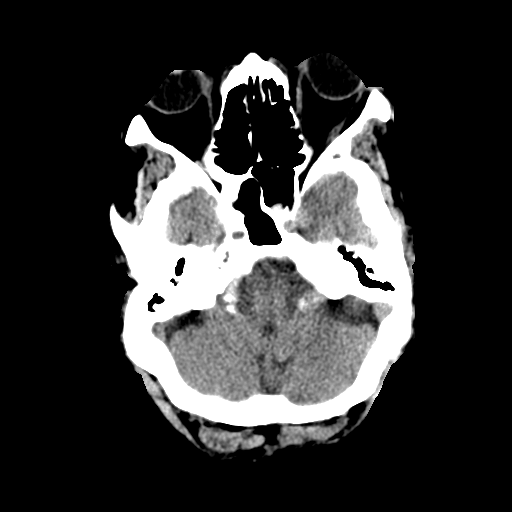
[im 5/31  bone]
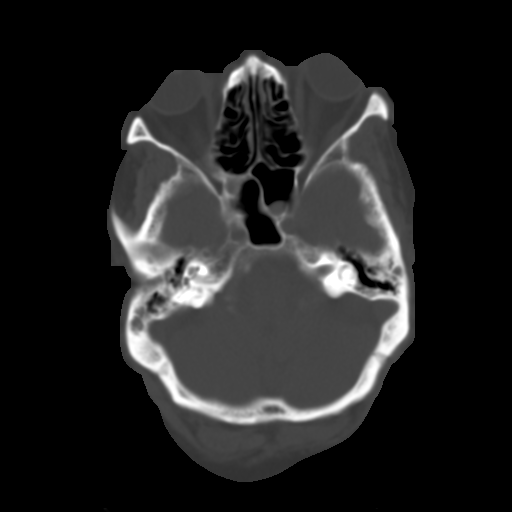
[im 9/31  brain]
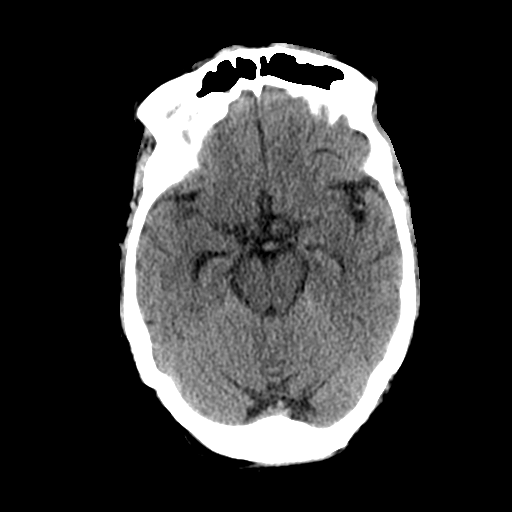
[im 13/31  brain]
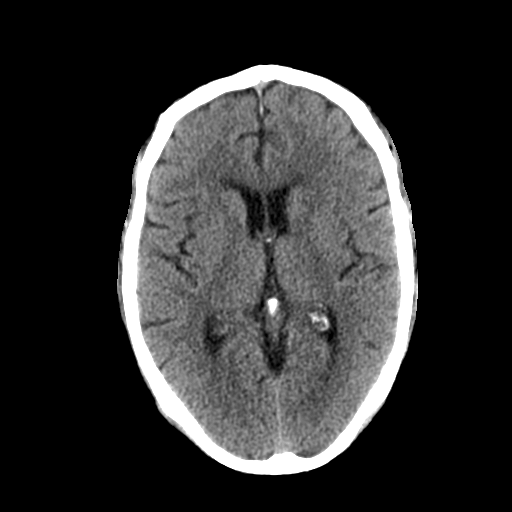
[im 18/31  brain]
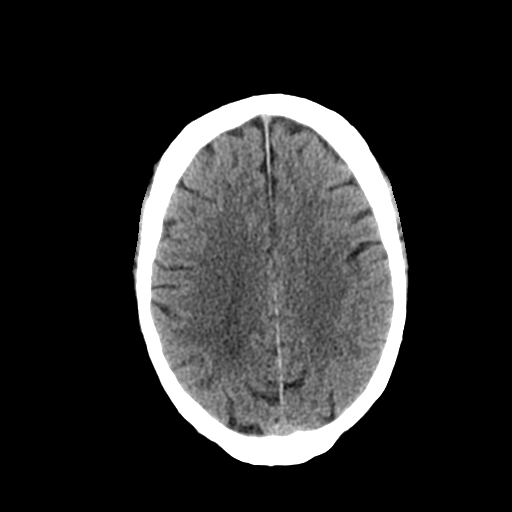
[im 22/31  brain]
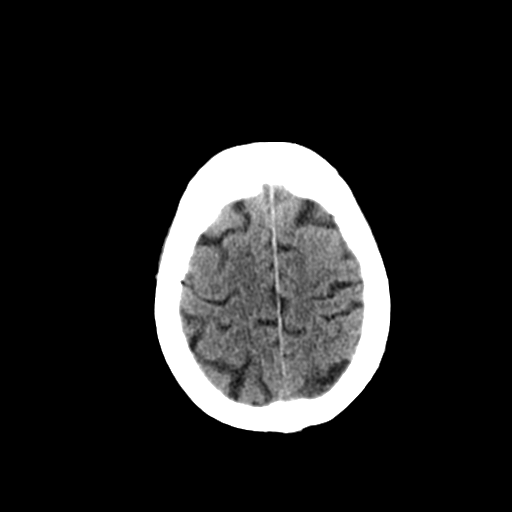
[im 22/31  bone]
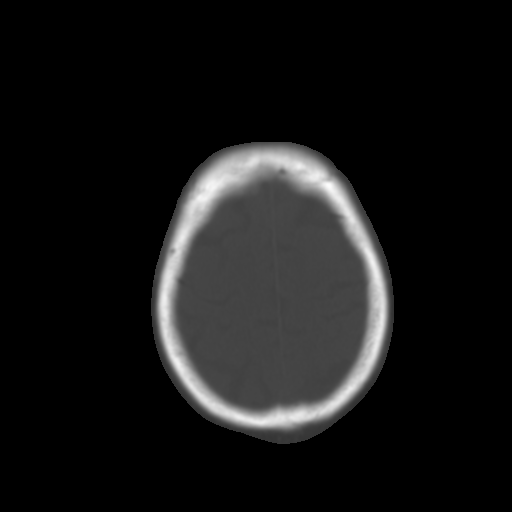
[im 26/31  brain]
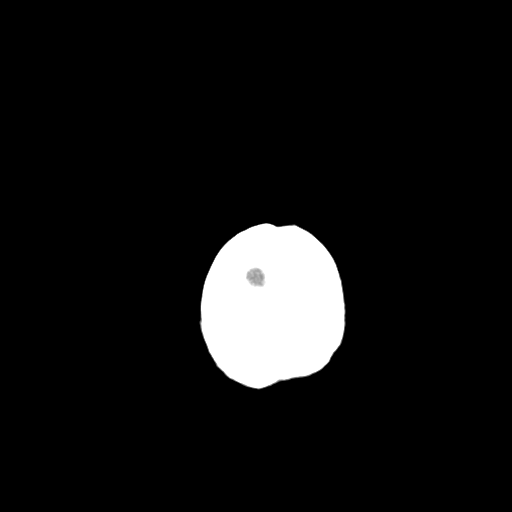

[Series 4: coronal soft tissue · coronal · 0.29mm/px · 3 of 59 slices shown]
[im 15/59  brain]
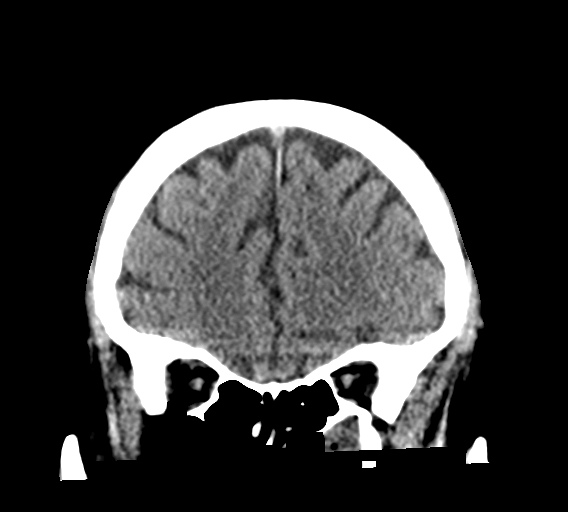
[im 30/59  brain]
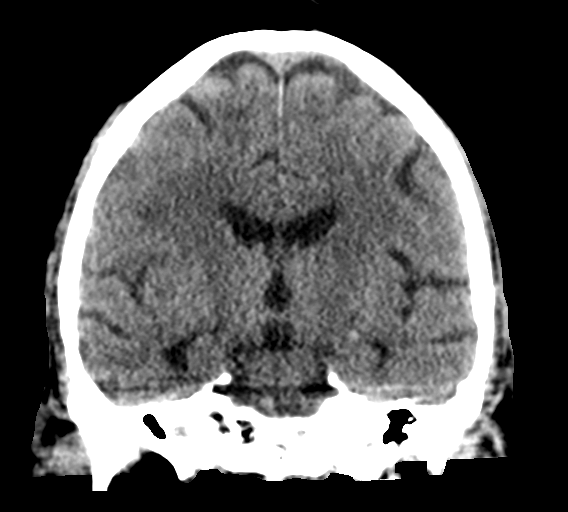
[im 44/59  brain]
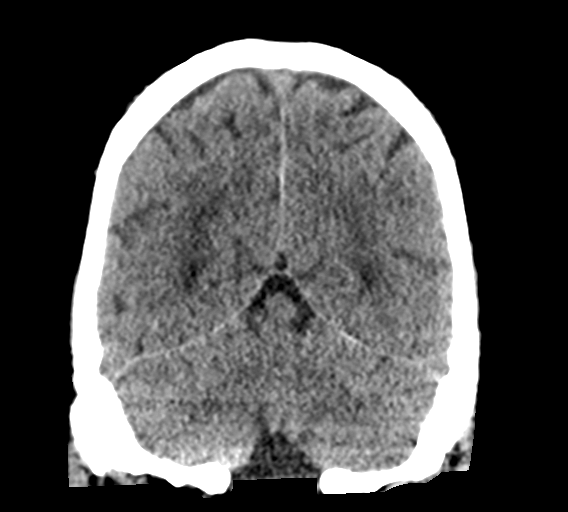

[Series 5: sagittal soft tissue · sagittal · 0.29mm/px · 1 of 46 slices shown]
[im 23/46  brain]
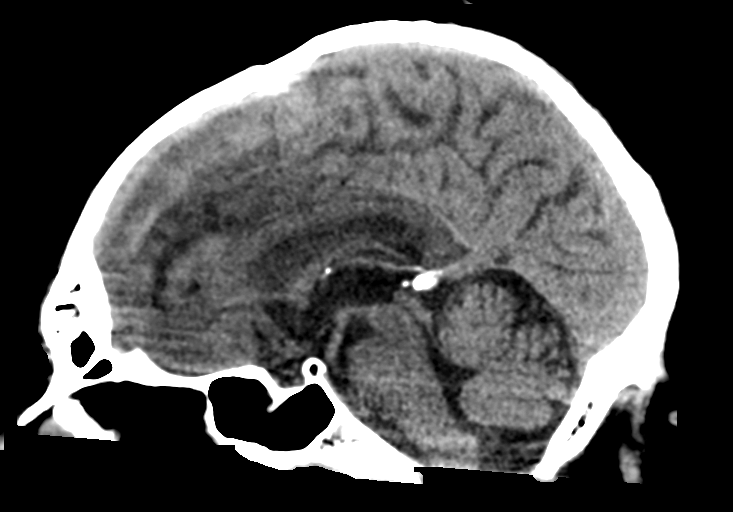

[Series 11: c spine soft · axial · 0.40mm/px · z∈[-216,-150]mm · 5 of 76 slices shown]
[im 9/76  brain]
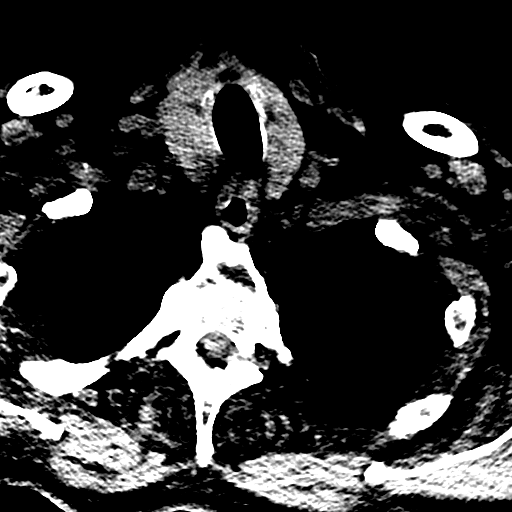
[im 17/76  brain]
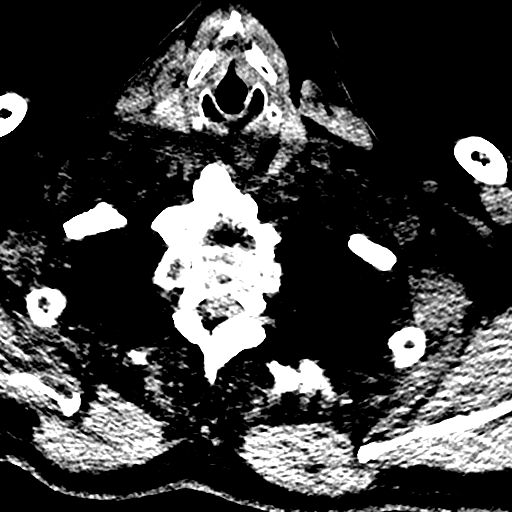
[im 26/76  brain]
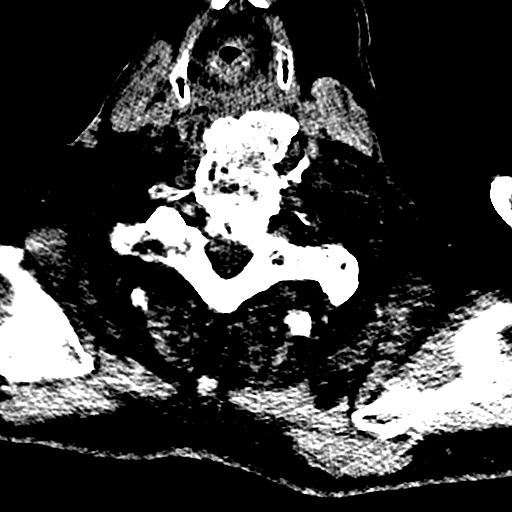
[im 34/76  brain]
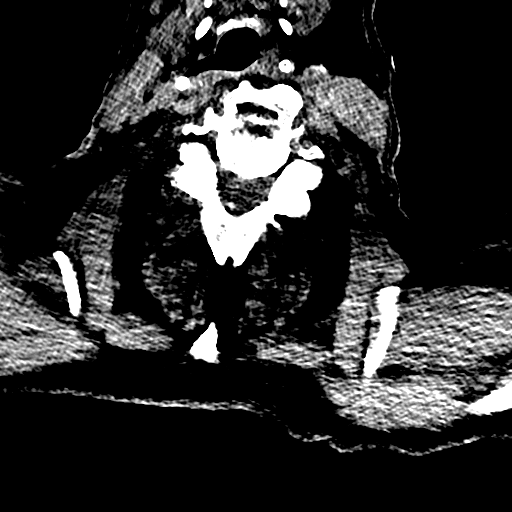
[im 42/76  brain]
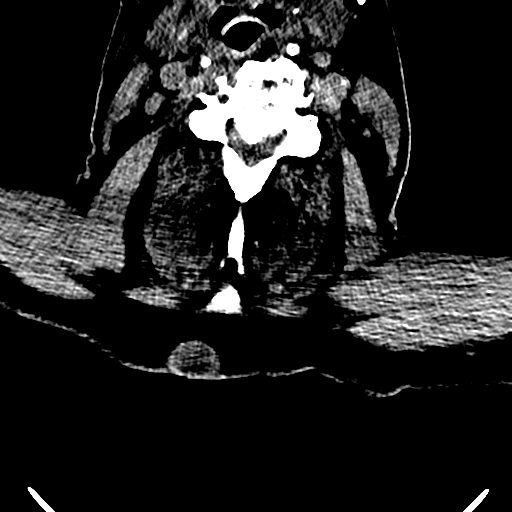

[15 of 47 positions shown; findings below may reference images not displayed]

FINDINGS: CT HEAD FINDINGS

Brain: Ventricles, cisterns and other CSF spaces are within normal.
There is mild chronic ischemic microvascular disease. There is no
mass, mass effect, shift of midline structures or acute hemorrhage.
No evidence of acute infarction.

Vascular: No hyperdense vessel or unexpected calcification.

Skull: Normal. Negative for fracture or focal lesion.

Sinuses/Orbits: Orbits are within normal. There is opacification
over the ethmoid air cells with mucosal membrane thickening
involving the sphenoid sinus and air-fluid level over the left
maxillary sinus. Opacification over the mastoid air cells.

Other: None.

CT CERVICAL SPINE FINDINGS

Alignment: Within normal.

Skull base and vertebrae: Vertebral body heights are maintained.
There is moderate spondylosis throughout the cervical spine with
prominent anterior osteophytes. Atlantoaxial articulation is within
normal. There is uncovertebral joint spurring and facet arthropathy.
There is a displaced fracture involving the base of the C6
transverse process extending into the left C6 lamina. There is a
minimally displaced fracture of the left C6-7 facet joint involving
the superior facet of C7 extending toward the base of the left C7
transverse process.. Moderate bilateral neural foraminal narrowing
at multiple levels due to adjacent bony spurring.

Soft tissues and spinal canal: Prevertebral soft tissues are within
normal. Mild central canal stenosis at the C4-5 and C5-6 levels.

Disc levels:  Mild disc space narrowing at the C5-6 and C6-7 levels.

Upper chest: Mild emphysematous disease.

Other: None.
IMPRESSION: No acute brain injury.

Mildly displaced fracture involving the base of the C6 spinous
process extending into the left lamina of C6. Minimally displaced
fracture involving the left superior C7 facet at the C6-7 facet
joint extending to the base of the left C7 transverse process.. No
free fragments within the canal.

Moderate spondylosis throughout the cervical spine with disc disease
at the C5-6 and C6-7 levels. Moderate multilevel bilateral neural
foraminal narrowing.

Moderate sinus inflammatory disease as described.

Critical Value/emergent results were called by telephone at the time
of interpretation on 05/31/2018 at [DATE] to Dr. QRI TWAHRI ,
who verbally acknowledged these results.

## 2019-06-02 IMAGING — CT CT L SPINE W/O CM
3 of 4 series · 12 of 33 positions shown, 14 images · non-contrast
Comparison: None.

CLINICAL DATA: Lumbosacral pain.

EXAM:
CT LUMBAR SPINE WITHOUT CONTRAST
TECHNIQUE: Multidetector CT imaging of the lumbar spine was performed without
intravenous contrast administration. Multiplanar CT image
reconstructions were also generated.

[Series 6: sagittal bone · sagittal · 0.29mm/px · 5 of 68 slices shown, 6 images]
[im 23/68  bone]
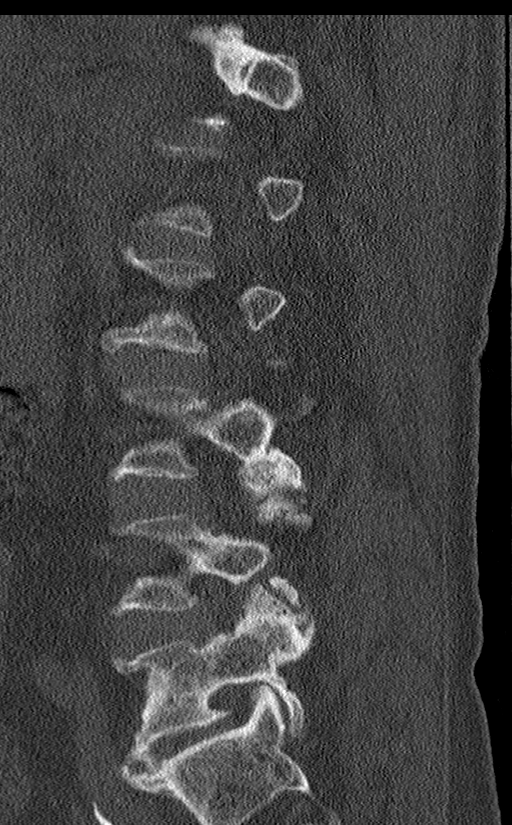
[im 28/68  bone]
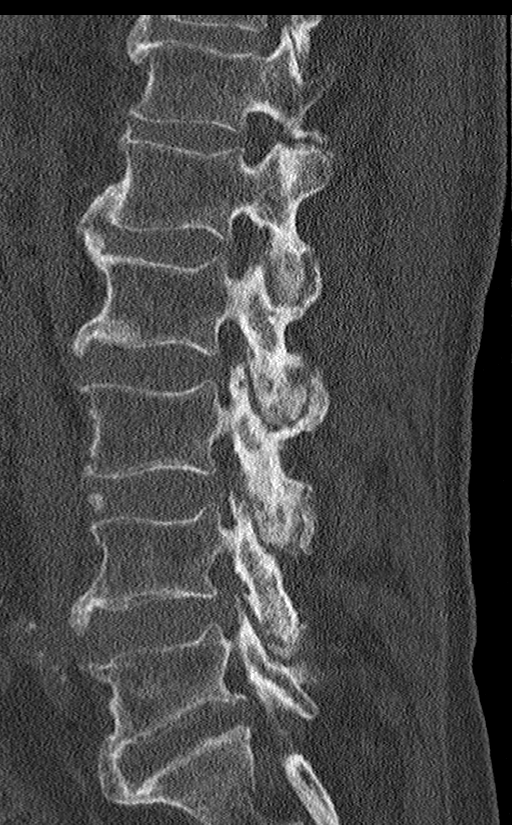
[im 34/68  soft-tissue]
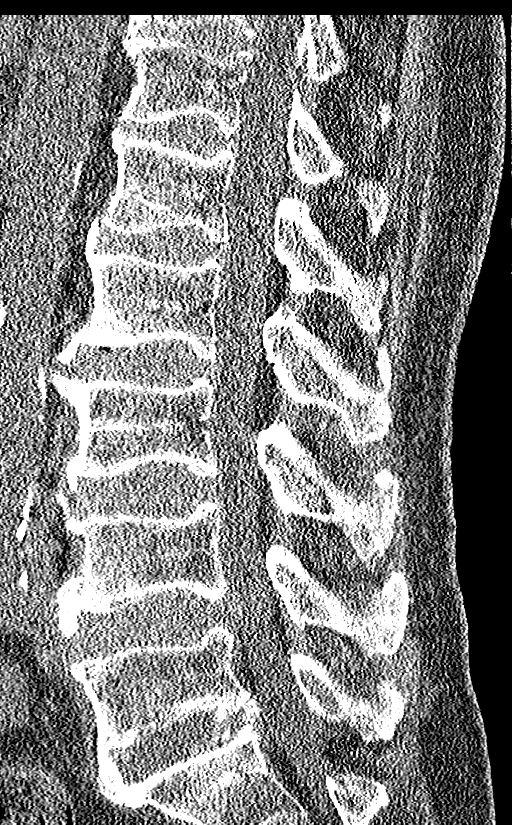
[im 34/68  bone]
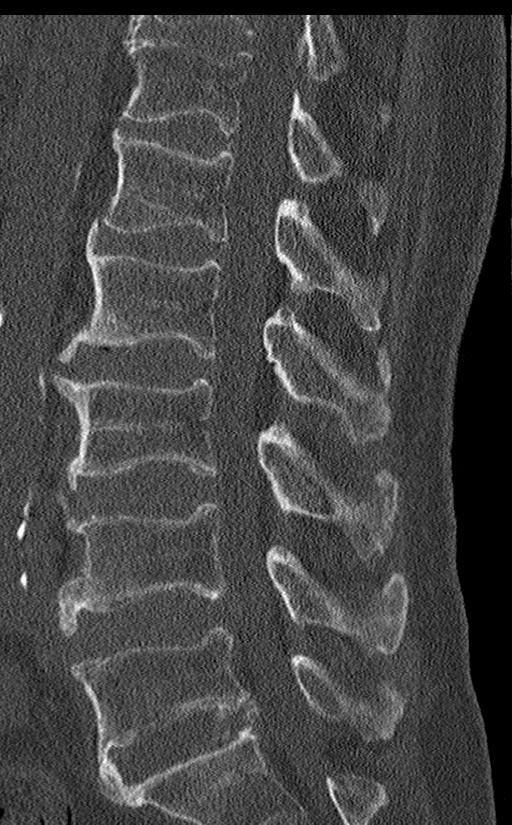
[im 40/68  bone]
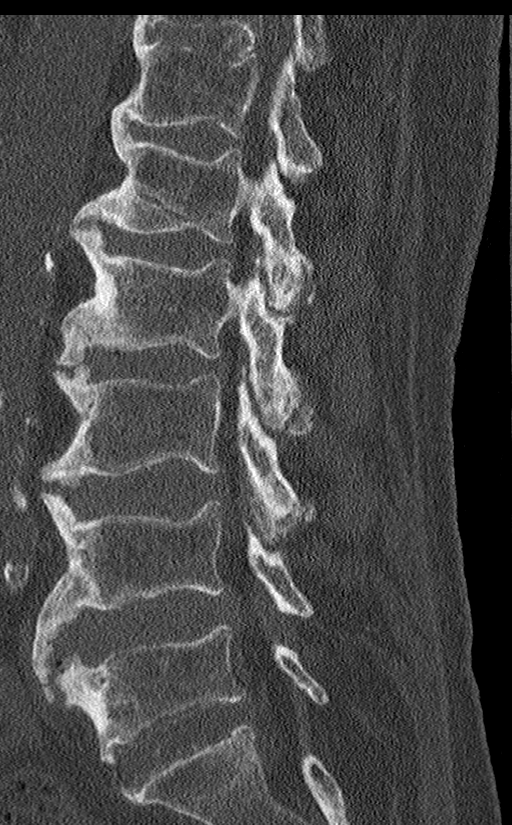
[im 45/68  bone]
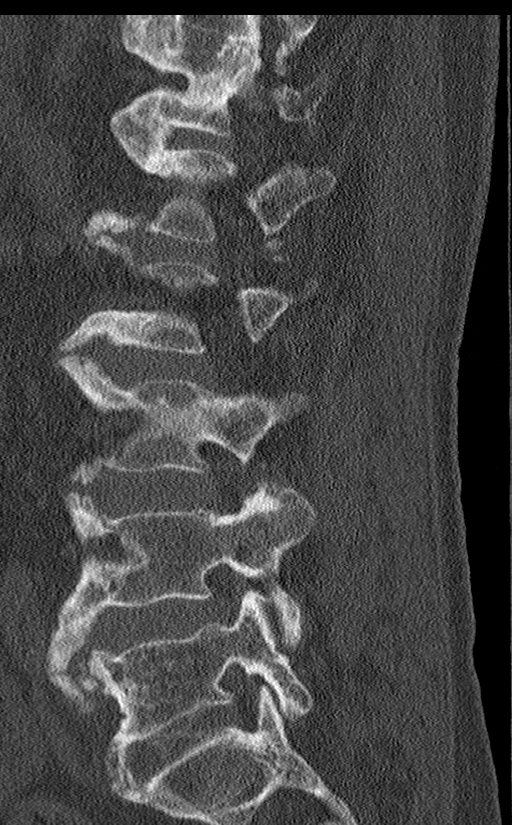

[Series 7: coronal bone · coronal · 0.32mm/px · 3 of 57 slices shown]
[im 12/57  bone]
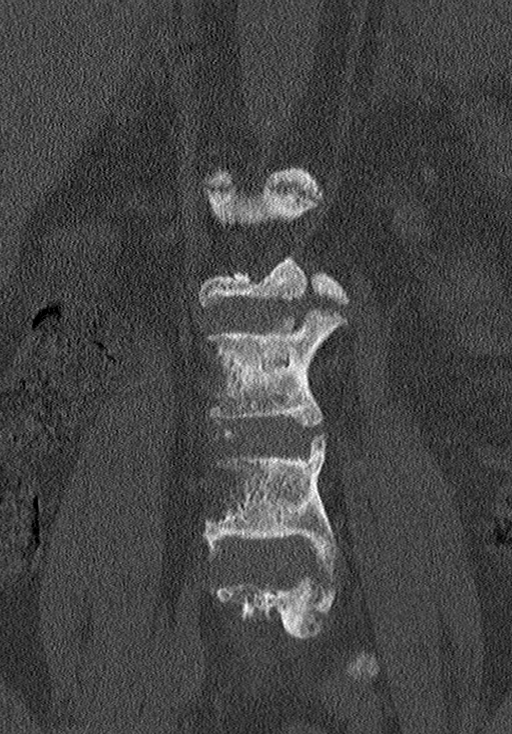
[im 23/57  bone]
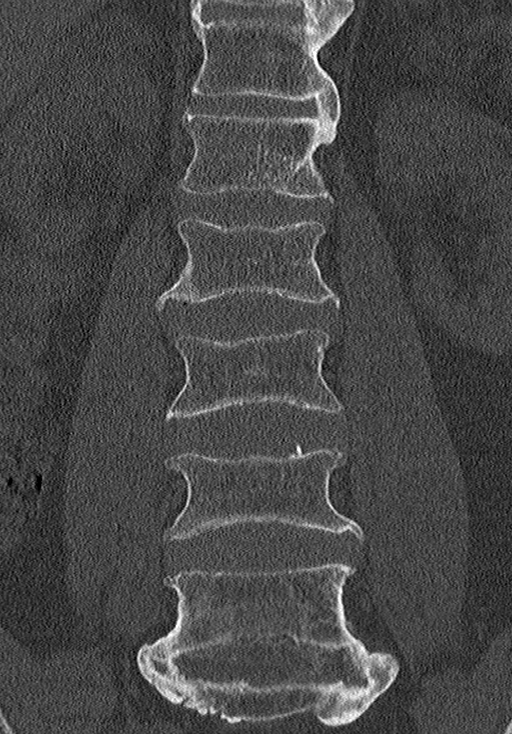
[im 34/57  bone]
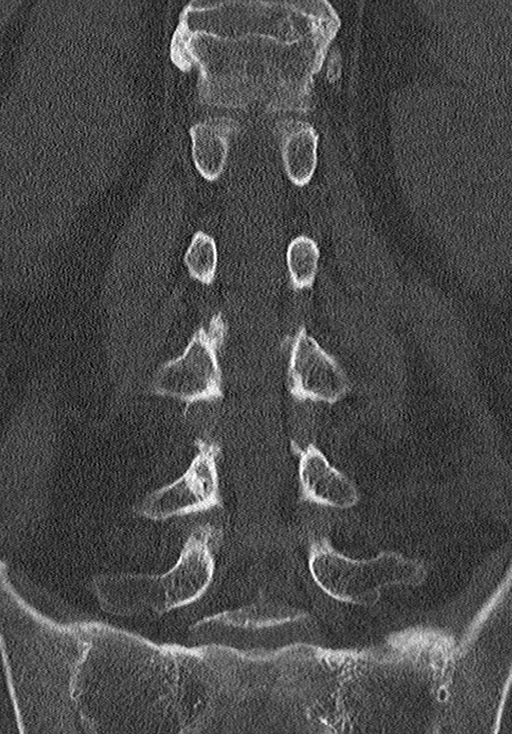

[Series 10: multi disc · axial · 0.23mm/px · z∈[-636,-451]mm · 4 of 118 slices shown, 5 images]
[im 20/118  soft-tissue]
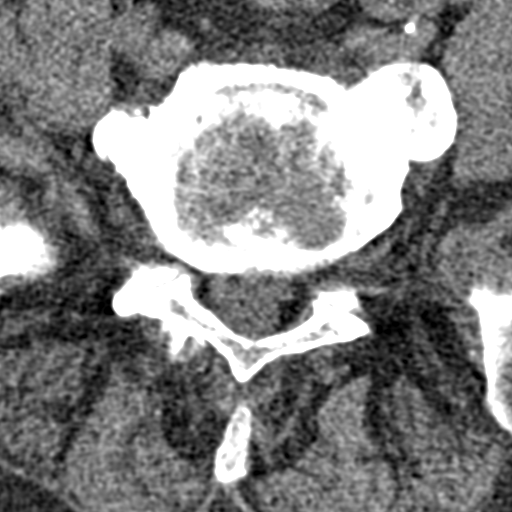
[im 20/118  bone]
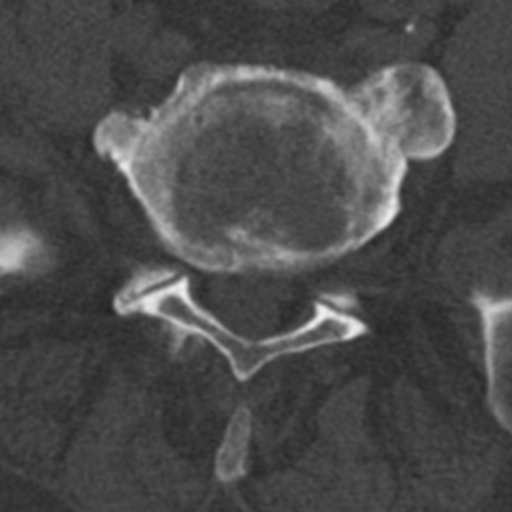
[im 40/118  bone]
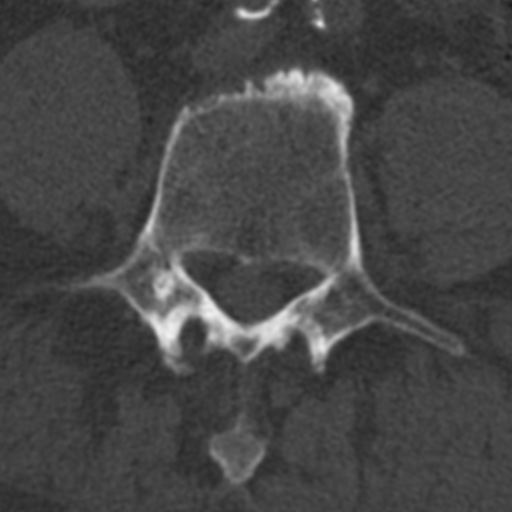
[im 79/118  bone]
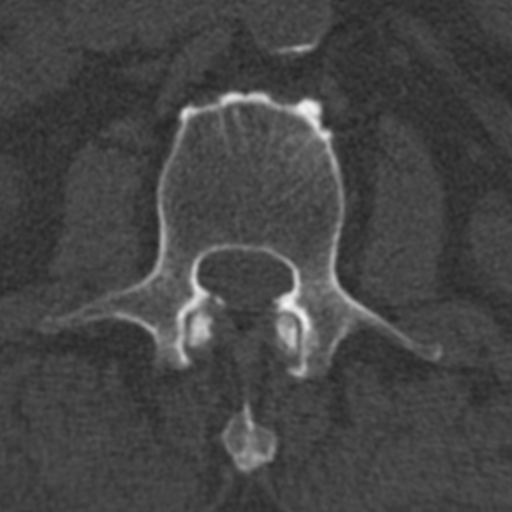
[im 98/118  bone]
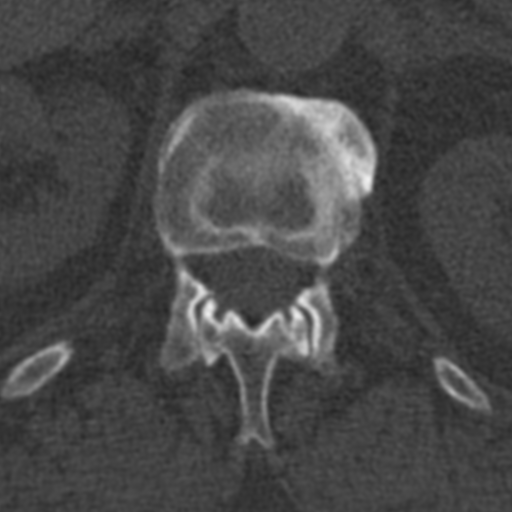

[12 of 33 positions shown; findings below may reference images not displayed]

FINDINGS: Segmentation: 5 lumbar type vertebrae.

Alignment: Normal.

Vertebrae: No acute fracture or focal pathologic process.

Paraspinal and other soft tissues: Negative.

Other: Abdominal aortic atherosclerosis. Ankylosis of bilateral
sacroiliac joints.

Disc levels: Disc spaces are maintained. Anterior bridging
osteophytes of the thoracolumbar spine as can be seen with diffuse
idiopathic skeletal hyperostosis. Bilateral facet arthropathy
throughout the lumbar spine. No significant disc protrusion. Mild
broad-based disc bulge at L2-3, L3-4, L4-5, and L5-S1. Right
foraminal narrowing at L4-5 and L5-S1.
IMPRESSION: 1.  No acute osseous injury of the lumbar spine.
2. Lumbar spine spondylosis as described above.
3. Ankylosis of bilateral sacroiliac joints as can be seen with
ankylosing spondylitis.

## 2019-06-10 ENCOUNTER — Emergency Department: Payer: Medicare HMO

## 2019-06-10 ENCOUNTER — Emergency Department
Admission: EM | Admit: 2019-06-10 | Discharge: 2019-06-10 | Disposition: A | Discharge status: Home / Self Care | Payer: Medicare HMO | Attending: Emergency Medicine | Admitting: Emergency Medicine

## 2019-06-10 ENCOUNTER — Other Ambulatory Visit: Payer: Self-pay

## 2019-06-10 DIAGNOSIS — I129 Hypertensive chronic kidney disease with stage 1 through stage 4 chronic kidney disease, or unspecified chronic kidney disease: Secondary | ICD-10-CM | POA: Diagnosis not present

## 2019-06-10 DIAGNOSIS — F17290 Nicotine dependence, other tobacco product, uncomplicated: Secondary | ICD-10-CM | POA: Insufficient documentation

## 2019-06-10 DIAGNOSIS — M545 Low back pain, unspecified: Secondary | ICD-10-CM

## 2019-06-10 DIAGNOSIS — Z79899 Other long term (current) drug therapy: Secondary | ICD-10-CM | POA: Insufficient documentation

## 2019-06-10 DIAGNOSIS — N183 Chronic kidney disease, stage 3 (moderate): Secondary | ICD-10-CM | POA: Diagnosis not present

## 2019-06-10 MED ORDER — LIDOCAINE 5 % EX PTCH: 1.0000 | MEDICATED_PATCH | Freq: Two times a day (BID) | CUTANEOUS | 0 refills | Status: AC

## 2019-06-10 MED ORDER — LIDOCAINE 5 % EX PTCH
1.0000 | MEDICATED_PATCH | CUTANEOUS | Status: DC
  Administered 2019-06-10: 1 via TRANSDERMAL
  Filled 2019-06-10: qty 1

## 2019-06-10 NOTE — ED Provider Notes (Signed)
[facility] Emergency Department Provider Note   ____________________________________________   First MD Initiated Contact with Patient 06/10/19 1052     (approximate)  I have reviewed the triage vital signs and the nursing notes.   HISTORY  Chief Complaint Back Pain    HPI Gregory Hurley is a 73 y.o. male patient presented with acute back pain which started yesterday.  Patient has a history of arthritic condition of the cervical lumbar spine.  Patient denies radicular component to his back pain.  Patient denies bladder bowel dysfunction.  Patient rates his pain as a 10/10.  No palliative measure for complaint.         Past Medical History:  Diagnosis Date  . Anemia   . CKD (chronic kidney disease), stage III ([place])   . Elevated alkaline phosphatase level   . History of fracture of clavicle   . History of fracture of rib   . Hypertension   . Pancytopenia ([place])   . PTSD (post-traumatic stress disorder)    FOLLOWED AT [facility]  . Thrombocytopenia [facility])     Patient Active Problem List   Diagnosis Date Noted  . PTSD (post-traumatic stress disorder) 06/25/2018  . Tetraplegia ([place]) 06/25/2018  . Labile blood pressure   . Drug induced constipation   . Acute blood loss anemia   . Central cord syndrome ([place]) 06/07/2018  . Syncope 07/07/2017  . HTN (hypertension) 07/07/2017  . Depression 07/07/2017  . CKD (chronic kidney disease), stage III ([place]) 07/07/2017  . Hyponatremia 07/07/2017  . Hypokalemia 07/07/2017    Past Surgical History:  Procedure Laterality Date  . NO PAST SURGERIES      Prior to Admission medications   Medication Sig Start Date End Date Taking? Authorizing Provider  acetaminophen (TYLENOL) 325 MG tablet Take 1-2 tablets (325-650 mg total) by mouth every 4 (four) hours as needed for mild pain. 06/22/18   Love, Ivan Anchors, PA-C  albuterol (PROVENTIL HFA;VENTOLIN HFA) 108 (90 Base) MCG/ACT inhaler Inhale into the lungs every 6  (six) hours as needed for wheezing or shortness of breath.    [provider]  amLODipine (NORVASC) 10 MG tablet Take 1 tablet (10 mg total) by mouth daily. 06/22/18   Love, Ivan Anchors, PA-C  bisacodyl (DULCOLAX) 10 MG suppository Place 1 suppository (10 mg total) rectally daily at 6 (six) AM. 06/23/18   Love, Ivan Anchors, PA-C  carvedilol (COREG) 3.125 MG tablet Take 1 tablet (3.125 mg total) by mouth 2 (two) times daily with a meal. 06/22/18   Love, Ivan Anchors, PA-C  cloNIDine (CATAPRES) 0.2 MG tablet Take 1 tablet (0.2 mg total) by mouth 2 (two) times daily. 06/22/18   Love, Ivan Anchors, PA-C  gabapentin (NEURONTIN) 100 MG capsule Take 1 capsule (100 mg total) by mouth at bedtime. 06/22/18   Love, Ivan Anchors, PA-C  hydrALAZINE (APRESOLINE) 25 MG tablet Take 1 tablet (25 mg total) by mouth every 8 (eight) hours. 06/22/18   Love, Ivan Anchors, PA-C  latanoprost (XALATAN) 0.005 % ophthalmic solution Place 1 drop into both eyes at bedtime.    [provider]  lidocaine (LIDODERM) 5 % Apply to left shoulder at 7 am and remove at 7 pm daily.  PURCHASE THIS OVER THE COUNTER. 06/22/18   Love, Ivan Anchors, PA-C  lidocaine (LIDODERM) 5 % Place 1 patch onto the skin every 12 (twelve) hours. Remove & Discard patch within 12 hours or as directed by MD 06/10/19 06/09/20  Sable Feil, PA-C  Melatonin 3 MG TABS Take 2 tablets (6 mg total) by mouth at bedtime. IS OVER THE COUNTER. USED FOR SLEEP 06/22/18   Love, Ivan Anchors, PA-C  PARoxetine (PAXIL) 40 MG tablet Take 1 tablet (40 mg total) by mouth daily. 06/22/18   Love, Ivan Anchors, PA-C  polyethylene glycol (MIRALAX / GLYCOLAX) packet Take 17 g by mouth daily. 06/23/18   Love, Ivan Anchors, PA-C  traZODone (DESYREL) 100 MG tablet Take 1 tablet (100 mg total) by mouth at bedtime. 06/22/18   Love, Ivan Anchors, PA-C  vitamin B-12 (CYANOCOBALAMIN) 1000 MCG tablet Take 1,000 mcg by mouth daily.     [provider]    Allergies Ace inhibitors  Family History  Problem Relation Age of  Onset  . Breast cancer Mother   . Colon cancer Father   . Prostate cancer Father     Social History Social History   Tobacco Use  . Smoking status: Current Every Day Smoker    Packs/day: 1.00    Years: 50.00    Pack years: 50.00    Types: Cigars  . Smokeless tobacco: Never Used  . Tobacco comment: 56 PACK YR HX SMOKING SO HIGH RISK LUNG CANCER  Substance Use Topics  . Alcohol use: Yes    Alcohol/week: 0.0 standard drinks    Comment: "reports no alcohol use in 5-7 days"  . Drug use: Yes    Comment: H/X OF CANNABIS ABUSE    Review of Systems Constitutional: No fever/chills Eyes: No visual changes. ENT: No sore throat. Cardiovascular: Denies chest pain. Respiratory: Denies shortness of breath. Gastrointestinal: No abdominal pain.  No nausea, no vomiting.  No diarrhea.  No constipation. Genitourinary: Negative for dysuria. Musculoskeletal: Positive for back pain. Skin: Negative for rash. Neurological: Negative for headaches, focal weakness or numbness. Endocrine:  Hypertension  ____________________________________________   PHYSICAL EXAM:  VITAL SIGNS: ED Triage Vitals [06/10/19 1043]  Enc Vitals Group     BP (!) 147/60     Pulse Rate 64     Resp 18     Temp 98.7 F (37.1 C)     Temp Source Oral     SpO2 99 %     Weight 140 lb (63.5 kg)     Height 5\' 11"  (1.803 m)     Head Circumference      Peak Flow      Pain Score 10     Pain Loc      Pain Edu?      Excl. in [place]?     Constitutional: Alert and oriented. Well appearing and in no acute distress. Cardiovascular: Normal rate, regular rhythm. Grossly normal heart sounds.  Good peripheral circulation. Respiratory: Normal respiratory effort.  No retractions. Lungs CTAB. Gastrointestinal: Soft and nontender. No distention. No abdominal bruits. No CVA tenderness. Musculoskeletal: No obvious lumbar spinal deformity.  Patient decreased range of motion all fields.  Patient has negative bilateral straight leg test.   Patient ambulates with support.  Neurologic:  Normal speech and language. No gross focal neurologic deficits are appreciated. No gait instability. Skin:  Skin is warm, dry and intact. No rash noted. Psychiatric: Mood and affect are normal. Speech and behavior are normal.  ____________________________________________   LABS (all labs ordered are listed, but only abnormal results are displayed)  Labs Reviewed - No data to display ____________________________________________  EKG   ____________________________________________  RADIOLOGY  ED MD interpretation:    Official radiology report(s): Dg Lumbar Spine 2-3 Views  Result Date: 06/10/2019  CLINICAL DATA:  Left lower back pain since last night. No known injury. EXAM: LUMBAR SPINE - 2-3 VIEW COMPARISON:  CT lumbar spine 05/31/2018. FINDINGS: There are 5 lumbar type vertebral bodies. The alignment is stable and near anatomic. There is no evidence of acute fracture or pars defect. Paraspinal osteophytes are again noted throughout the lumbar spine. The disc spaces are relatively preserved. IMPRESSION: Stable appearance of the lumbar spine with prominent paraspinal osteophytes most consistent with diffuse idiopathic skeletal hyperostosis. Electronically Signed   By: Richardean Sale M.D.   On: 06/10/2019 11:31    ____________________________________________   PROCEDURES  Procedure(s) performed (including Critical Care):  Procedures   ____________________________________________   INITIAL IMPRESSION / ASSESSMENT AND PLAN / ED COURSE  As part of my medical decision making, I reviewed the following data within the [facility]         Patient presented onset acute back pain.  Physical exam was grossly unremarkable.  X-ray was taken for further evaluation showed moderate degenerative changes in the lumbar spine.  X-rays were discussed with patient.  Patient given discharge care instruction advised follow-up PCP for  continued care.  Patient given a Lidoderm pain patch prior to departure.      ____________________________________________   FINAL CLINICAL IMPRESSION(S) / ED DIAGNOSES  Final diagnoses:  Back pain at L4-L5 level     ED Discharge Orders         Ordered    lidocaine (LIDODERM) 5 %  Every 12 hours     06/10/19 1147           Note:  This document was prepared using Dragon voice recognition software and may include unintentional dictation errors.    Sable Feil, PA-C 06/10/19 1154    Duffy Bruce, MD 06/10/19 1235

## 2019-06-10 NOTE — ED Triage Notes (Signed)
Pt c/o left lower back pain since last night. Denies injury.

## 2019-06-10 NOTE — ED Notes (Signed)
See triage note  Presents with lower back pain  Describes pain as sharp and started last pm   Denies any injury

## 2019-08-09 ENCOUNTER — Emergency Department
Admission: EM | Admit: 2019-08-09 | Discharge: 2019-08-09 | Disposition: A | Discharge status: Home / Self Care | Payer: Medicare HMO | Attending: Student | Admitting: Student

## 2019-08-09 ENCOUNTER — Encounter: Payer: Self-pay | Admitting: Emergency Medicine

## 2019-08-09 DIAGNOSIS — R300 Dysuria: Secondary | ICD-10-CM | POA: Diagnosis not present

## 2019-08-09 DIAGNOSIS — N183 Chronic kidney disease, stage 3 (moderate): Secondary | ICD-10-CM | POA: Insufficient documentation

## 2019-08-09 DIAGNOSIS — F17290 Nicotine dependence, other tobacco product, uncomplicated: Secondary | ICD-10-CM | POA: Insufficient documentation

## 2019-08-09 DIAGNOSIS — R1084 Generalized abdominal pain: Secondary | ICD-10-CM | POA: Diagnosis not present

## 2019-08-09 DIAGNOSIS — R52 Pain, unspecified: Secondary | ICD-10-CM | POA: Diagnosis not present

## 2019-08-09 DIAGNOSIS — I10 Essential (primary) hypertension: Secondary | ICD-10-CM | POA: Diagnosis not present

## 2019-08-09 DIAGNOSIS — I129 Hypertensive chronic kidney disease with stage 1 through stage 4 chronic kidney disease, or unspecified chronic kidney disease: Secondary | ICD-10-CM | POA: Insufficient documentation

## 2019-08-09 DIAGNOSIS — R339 Retention of urine, unspecified: Secondary | ICD-10-CM | POA: Insufficient documentation

## 2019-08-09 LAB — CBC WITH DIFFERENTIAL/PLATELET
Abs Immature Granulocytes: 0.01 10*3/uL (ref 0.00–0.07)
Basophils Absolute: 0 10*3/uL (ref 0.0–0.1)
Basophils Relative: 0 %
Eosinophils Absolute: 0 10*3/uL (ref 0.0–0.5)
Eosinophils Relative: 0 %
HCT: 33.3 % — ABNORMAL LOW (ref 39.0–52.0)
Hemoglobin: 11.5 g/dL — ABNORMAL LOW (ref 13.0–17.0)
Immature Granulocytes: 0 %
Lymphocytes Relative: 18 %
Lymphs Abs: 0.5 10*3/uL — ABNORMAL LOW (ref 0.7–4.0)
MCH: 27.3 pg (ref 26.0–34.0)
MCHC: 34.5 g/dL (ref 30.0–36.0)
MCV: 79.1 fL — ABNORMAL LOW (ref 80.0–100.0)
Monocytes Absolute: 0.1 10*3/uL (ref 0.1–1.0)
Monocytes Relative: 5 %
Neutro Abs: 2 10*3/uL (ref 1.7–7.7)
Neutrophils Relative %: 77 %
Platelets: 217 10*3/uL (ref 150–400)
RBC: 4.21 MIL/uL — ABNORMAL LOW (ref 4.22–5.81)
RDW: 15.1 % (ref 11.5–15.5)
WBC: 2.6 10*3/uL — ABNORMAL LOW (ref 4.0–10.5)
nRBC: 0 % (ref 0.0–0.2)

## 2019-08-09 LAB — URINALYSIS, COMPLETE (UACMP) WITH MICROSCOPIC
Bacteria, UA: NONE SEEN
Bilirubin Urine: NEGATIVE
Glucose, UA: NEGATIVE mg/dL
Hgb urine dipstick: NEGATIVE
Ketones, ur: NEGATIVE mg/dL
Leukocytes,Ua: NEGATIVE
Nitrite: NEGATIVE
Protein, ur: NEGATIVE mg/dL
Specific Gravity, Urine: 1.008 (ref 1.005–1.030)
pH: 8 (ref 5.0–8.0)

## 2019-08-09 LAB — COMPREHENSIVE METABOLIC PANEL
ALT: 13 U/L (ref 0–44)
AST: 28 U/L (ref 15–41)
Albumin: 3.9 g/dL (ref 3.5–5.0)
Alkaline Phosphatase: 66 U/L (ref 38–126)
Anion gap: 12 (ref 5–15)
BUN: 8 mg/dL (ref 8–23)
CO2: 22 mmol/L (ref 22–32)
Calcium: 9.5 mg/dL (ref 8.9–10.3)
Chloride: 96 mmol/L — ABNORMAL LOW (ref 98–111)
Creatinine, Ser: 0.96 mg/dL (ref 0.61–1.24)
GFR calc Af Amer: >60 mL/min (ref >60)
GFR calc non Af Amer: >60 mL/min (ref >60)
Glucose, Bld: 112 mg/dL — ABNORMAL HIGH (ref 70–99)
Potassium: 5 mmol/L (ref 3.5–5.1)
Sodium: 130 mmol/L — ABNORMAL LOW (ref 135–145)
Total Bilirubin: 0.5 mg/dL (ref 0.3–1.2)
Total Protein: 7.2 g/dL (ref 6.5–8.1)

## 2019-08-09 MED ORDER — TAMSULOSIN HCL 0.4 MG PO CAPS
0.4000 mg | ORAL_CAPSULE | Freq: Once | ORAL | Status: AC
  Administered 2019-08-09: 13:00:00 0.4 mg via ORAL
  Filled 2019-08-09: qty 1

## 2019-08-09 MED ORDER — SODIUM CHLORIDE 0.9 % IV BOLUS
1000.0000 mL | Freq: Once | INTRAVENOUS | Status: AC
  Administered 2019-08-09: 1000 mL via INTRAVENOUS

## 2019-08-09 NOTE — ED Notes (Signed)
Brother at bedside 

## 2019-08-09 NOTE — ED Provider Notes (Addendum)
[facility] Emergency Department Provider Note  ____________________________________________   First MD Initiated Contact with Patient 08/09/19 1201     (approximate)  I have reviewed the triage vital signs and the nursing notes.  History  Chief Complaint Urinary Retention    HPI Gregory Hurley is a 73 y.o. male with history of CKD, hx of central cord, who presents for urinary retention and dysuria.  Patient states since last night, he is experiencing pain/burning with urination. No blood in urine.  Because of this, he has been unable to urinate since approximately 7 PM yesterday. He has associated lower abdominal/suprapubic discomfort, moderate in severity. He denies any fevers, nausea, vomiting. No rectal pain or discomfort. No history of similar symptoms. No associated back pain, lower extremity weakness, numbness, or tingling.            Past Medical Hx Past Medical History:  Diagnosis Date   Anemia    CKD (chronic kidney disease), stage III ([facility])    Elevated alkaline phosphatase level    History of fracture of clavicle    History of fracture of rib    Hypertension    Pancytopenia ([facility])    PTSD (post-traumatic stress disorder)    [facility]   Thrombocytopenia [facility])     Problem List Patient Active Problem List   Diagnosis Date Noted   PTSD (post-traumatic stress disorder) 06/25/2018   Tetraplegia ([place]) 06/25/2018   Labile blood pressure    Drug induced constipation    Acute blood loss anemia    Central cord syndrome ([place]) 06/07/2018   Syncope 07/07/2017   HTN (hypertension) 07/07/2017   Depression 07/07/2017   CKD (chronic kidney disease), stage III ([place]) 07/07/2017   Hyponatremia 07/07/2017   Hypokalemia 07/07/2017    Past Surgical Hx Past Surgical History:  Procedure Laterality Date   NO PAST SURGERIES      Medications Prior to Admission medications   Medication Sig Start Date End Date  Taking? Authorizing Provider  acetaminophen (TYLENOL) 325 MG tablet Take 1-2 tablets (325-650 mg total) by mouth every 4 (four) hours as needed for mild pain. 06/22/18   Love, Ivan Anchors, PA-C  albuterol (PROVENTIL HFA;VENTOLIN HFA) 108 (90 Base) MCG/ACT inhaler Inhale into the lungs every 6 (six) hours as needed for wheezing or shortness of breath.    [provider]  amLODipine (NORVASC) 10 MG tablet Take 1 tablet (10 mg total) by mouth daily. 06/22/18   Love, Ivan Anchors, PA-C  bisacodyl (DULCOLAX) 10 MG suppository Place 1 suppository (10 mg total) rectally daily at 6 (six) AM. 06/23/18   Love, Ivan Anchors, PA-C  carvedilol (COREG) 3.125 MG tablet Take 1 tablet (3.125 mg total) by mouth 2 (two) times daily with a meal. 06/22/18   Love, Ivan Anchors, PA-C  cloNIDine (CATAPRES) 0.2 MG tablet Take 1 tablet (0.2 mg total) by mouth 2 (two) times daily. 06/22/18   Love, Ivan Anchors, PA-C  gabapentin (NEURONTIN) 100 MG capsule Take 1 capsule (100 mg total) by mouth at bedtime. 06/22/18   Love, Ivan Anchors, PA-C  hydrALAZINE (APRESOLINE) 25 MG tablet Take 1 tablet (25 mg total) by mouth every 8 (eight) hours. 06/22/18   Love, Ivan Anchors, PA-C  latanoprost (XALATAN) 0.005 % ophthalmic solution Place 1 drop into both eyes at bedtime.    [provider]  lidocaine (LIDODERM) 5 % Apply to left shoulder at 7 am and remove at 7 pm daily.  PURCHASE THIS OVER THE COUNTER. 06/22/18  Love, Ivan Anchors, PA-C  lidocaine (LIDODERM) 5 % Place 1 patch onto the skin every 12 (twelve) hours. Remove & Discard patch within 12 hours or as directed by MD 06/10/19 06/09/20  Sable Feil, PA-C  Melatonin 3 MG TABS Take 2 tablets (6 mg total) by mouth at bedtime. IS OVER THE COUNTER. USED FOR SLEEP 06/22/18   Love, Ivan Anchors, PA-C  PARoxetine (PAXIL) 40 MG tablet Take 1 tablet (40 mg total) by mouth daily. 06/22/18   Love, Ivan Anchors, PA-C  polyethylene glycol (MIRALAX / GLYCOLAX) packet Take 17 g by mouth daily. 06/23/18   Love, Ivan Anchors, PA-C  traZODone  (DESYREL) 100 MG tablet Take 1 tablet (100 mg total) by mouth at bedtime. 06/22/18   Love, Ivan Anchors, PA-C  vitamin B-12 (CYANOCOBALAMIN) 1000 MCG tablet Take 1,000 mcg by mouth daily.     [provider]    Allergies Ace inhibitors  Family Hx Family History  Problem Relation Age of Onset   Breast cancer Mother    Colon cancer Father    Prostate cancer Father     Social Hx Social History   Tobacco Use   Smoking status: Current Every Day Smoker    Packs/day: 1.00    Years: 50.00    Pack years: 50.00    Types: Cigars   Smokeless tobacco: Never Used   Tobacco comment: 88 PACK YR HX SMOKING SO HIGH RISK LUNG CANCER  Substance Use Topics   Alcohol use: Yes    Alcohol/week: 0.0 standard drinks    Comment: "reports no alcohol use in 5-7 days"   Drug use: Yes    Comment: H/X OF CANNABIS ABUSE     Review of Systems  Constitutional: Negative for fever. Negative for chills. Eyes: Negative for visual changes. ENT: Negative for sore throat. Cardiovascular: Negative for chest pain. Respiratory: Negative for shortness of breath. Gastrointestinal: Negative for abdominal pain. Negative for nausea. Negative for vomiting. Genitourinary: + dysuria, retention Musculoskeletal: Negative for leg swelling. Skin: Negative for rash. Neurological: Negative for for headaches.   Physical Exam  Vital Signs: ED Triage Vitals [08/09/19 1142]  Enc Vitals Group     BP (!) 152/86     Pulse Rate 68     Resp      Temp 97.8 F (36.6 C)     Temp Source Oral     SpO2 96 %     Weight      Height      Head Circumference      Peak Flow      Pain Score 8     Pain Loc      Pain Edu?      Excl. in [place]?     Constitutional: Alert and oriented.  Eyes: Conjunctivae clear. Sclera anicteric. Head: Normocephalic. Atraumatic. Nose: No congestion. No rhinorrhea. Mouth/Throat: Mucous membranes are moist.  Neck: No stridor.   Cardiovascular: Normal rate, regular rhythm. No murmurs.  Extremities well perfused. Respiratory: Normal respiratory effort.  Lungs CTAB. Gastrointestinal: Soft and non-tender. Moderate suprapubic fullness with some associated discomfort.  Musculoskeletal: No lower extremity edema. Neurologic:  Normal speech and language. No gross focal neurologic deficits are appreciated.  Bilateral lower extremity strength 5/5 and symmetric.  Sensation intact to light touch. Skin: Skin is warm, dry and intact. No rash noted. Psychiatric: Mood and affect are appropriate for situation.  EKG  N/A   Radiology  N/A   Procedures  Procedure(s) performed (including critical care):  Procedures  Initial Impression / Assessment and Plan / ED Course  73 y.o. male with history of CKD, hx of central cord, who presents for urinary retention and dysuria as above.    He has some mild suprapubic fullness on exam, associated bladder scan volume ~500 mL. In and out catheterized with significant improvement in his discomfort.   Differential includes UTI, BPH, medication side effect.  Do not suspect cauda equina as he has no associated back pain, leg weakness or numbness.  Low suspicion for prostatitis, given no systemic infectious symptoms, no rectal discomfort, day 1 of symptoms. No flank pain, hematuria, or PMHx suggestive of stone.   Creatinine WNL and no evidence of blood or infection on UA. After IV fluids and sensation to urinate, patient again unable to void.  Will place indwelling Foley catheter, and plan for outpatient follow-up with urology.  Patient is agreeable with this plan. Given return precautions.    Final Clinical Impression(s) / ED Diagnosis  Final diagnoses:  Urinary retention  Dysuria       Note:  This document was prepared using Dragon voice recognition software and may include unintentional dictation errors.     Lilia Pro., MD 08/09/19 [phone]

## 2019-08-09 NOTE — ED Notes (Addendum)
Pt had the urge to void but unable to void at this time. Pt st "it burns to pee, I can't pee". MD Monks had been notified.

## 2019-08-09 NOTE — ED Notes (Signed)
Signature pad not working. Pt verbalizes understanding of DC instructions.This Rn addressed all pt's questions.

## 2019-08-09 NOTE — ED Notes (Signed)
ED Provider Munks at bedside to provide update to pt.

## 2019-08-09 NOTE — ED Triage Notes (Addendum)
Pt from home via [facility]. Per EMS, pt st he has not been able to void since 7pm last night; pt st "it burns and hurts when I try to pee"; pt c/o groing pain a this time. Pt A/Ox4; pt denies prostate problems, abd pain/back pain, fever at this time.

## 2019-08-09 NOTE — Discharge Instructions (Addendum)
Thank you for letting us take care of you in the emergency department today.   Please continue to take your regular, prescribed medications.   Please follow up with: - Your primary care doctor to review your ER visit and follow up on your symptoms.  - The urology doctor at the information below, for further management of your Foley and your symptoms.   Please return to the ER for any new or worsening symptoms.

## 2019-08-20 ENCOUNTER — Ambulatory Visit (INDEPENDENT_AMBULATORY_CARE_PROVIDER_SITE_OTHER): Payer: Medicare HMO | Admitting: Urology

## 2019-08-20 ENCOUNTER — Encounter: Payer: Self-pay | Admitting: Urology

## 2019-08-20 ENCOUNTER — Other Ambulatory Visit: Payer: Self-pay

## 2019-08-20 VITALS — BP 126/75 | HR 59 | Ht 71.0 in | Wt 145.0 lb

## 2019-08-20 DIAGNOSIS — N401 Enlarged prostate with lower urinary tract symptoms: Secondary | ICD-10-CM

## 2019-08-20 DIAGNOSIS — R339 Retention of urine, unspecified: Secondary | ICD-10-CM | POA: Diagnosis not present

## 2019-08-20 NOTE — Progress Notes (Signed)
08/20/2019 8:22 AM   Gregory Hurley 1946-07-10 [id number]  Referring provider: Merry Proud, PA-C [place],  [place] [address]  Chief Complaint  Patient presents with  . Urinary Retention    HPI: Gregory Hurley is a 73 y.o. male seen for urinary retention.  He presented to the ED on 8/21/202020 with acute urinary retention.  He had had mild dysuria. Bladder scan showed an estimated volume of 500 mL.  In and out catheter was performed.  Urinalysis was unremarkable.  He was given IV fluids and not surprisingly unable to void and a Foley catheter was placed.  He has a history of central cord syndrome at C4 level after a fall in June 2019.  He underwent urgent posterior fusion C3-T1.  He had a catheter during that hospitalization though I do not see in the notes any urologic difficulties after discharge.  He was seen by urology at [facility] in September 2019 for a gross hematuria evaluation.  He had a CT urogram which showed stable nodularity of the left adrenal gland and mild bladder wall thickening.  Cystoscopy showed trilobar prostatic hypertrophy with a 2.5 cm prostatic urethral length and mild bladder neck elevation.  No bladder mucosal abnormalities were identified.  He does have mild urinary frequency and nocturia x1-2.  PMH: Past Medical History:  Diagnosis Date  . Anemia   . CKD (chronic kidney disease), stage III ([place])   . Elevated alkaline phosphatase level   . History of fracture of clavicle   . History of fracture of rib   . Hypertension   . Pancytopenia ([place])   . PTSD (post-traumatic stress disorder)    FOLLOWED AT [facility]  . Thrombocytopenia [facility])     Surgical History: Past Surgical History:  Procedure Laterality Date  . NO PAST SURGERIES      Home Medications:  Allergies as of 08/20/2019      Reactions   Ace Inhibitors    Other reaction(s): Angioedema      Medication List       Accurate as of August 20, 2019  8:22 AM. If you have any  questions, ask your nurse or doctor.        acetaminophen 325 MG tablet Commonly known as: TYLENOL Take 1-2 tablets (325-650 mg total) by mouth every 4 (four) hours as needed for mild pain.   albuterol 108 (90 Base) MCG/ACT inhaler Commonly known as: VENTOLIN HFA Inhale into the lungs every 6 (six) hours as needed for wheezing or shortness of breath.   amLODipine 10 MG tablet Commonly known as: NORVASC Take 1 tablet (10 mg total) by mouth daily.   bisacodyl 10 MG suppository Commonly known as: DULCOLAX Place 1 suppository (10 mg total) rectally daily at 6 (six) AM.   carvedilol 3.125 MG tablet Commonly known as: COREG Take 1 tablet (3.125 mg total) by mouth 2 (two) times daily with a meal.   cloNIDine 0.2 MG tablet Commonly known as: CATAPRES Take 1 tablet (0.2 mg total) by mouth 2 (two) times daily.   gabapentin 100 MG capsule Commonly known as: NEURONTIN Take 1 capsule (100 mg total) by mouth at bedtime.   hydrALAZINE 25 MG tablet Commonly known as: APRESOLINE Take 1 tablet (25 mg total) by mouth every 8 (eight) hours.   latanoprost 0.005 % ophthalmic solution Commonly known as: XALATAN Place 1 drop into both eyes at bedtime.   lidocaine 5 % Commonly known as: LIDODERM Apply to left shoulder at 7 am and  remove at 7 pm daily.  PURCHASE THIS OVER THE COUNTER.   lidocaine 5 % Commonly known as: Lidoderm Place 1 patch onto the skin every 12 (twelve) hours. Remove & Discard patch within 12 hours or as directed by MD   Melatonin 3 MG Tabs Take 2 tablets (6 mg total) by mouth at bedtime. IS OVER THE COUNTER. USED FOR SLEEP   PARoxetine 40 MG tablet Commonly known as: PAXIL Take 1 tablet (40 mg total) by mouth daily.   polyethylene glycol 17 g packet Commonly known as: MIRALAX / GLYCOLAX Take 17 g by mouth daily.   traZODone 100 MG tablet Commonly known as: DESYREL Take 1 tablet (100 mg total) by mouth at bedtime.   vitamin B-12 1000 MCG tablet Commonly  known as: CYANOCOBALAMIN Take 1,000 mcg by mouth daily.       Allergies:  Allergies  Allergen Reactions  . Ace Inhibitors     Other reaction(s): Angioedema    Family History: Family History  Problem Relation Age of Onset  . Breast cancer Mother   . Colon cancer Father   . Prostate cancer Father     Social History:  reports that he has been smoking cigars. He has a 50.00 pack-year smoking history. He has never used smokeless tobacco. He reports current alcohol use. He reports current drug use.  ROS: UROLOGY Frequent Urination?: Yes Hard to postpone urination?: No Burning/pain with urination?: Yes Get up at night to urinate?: Yes Leakage of urine?: No Urine stream starts and stops?: No Trouble starting stream?: No Do you have to strain to urinate?: No Blood in urine?: No Urinary tract infection?: Yes Sexually transmitted disease?: No Injury to kidneys or bladder?: No Painful intercourse?: No Weak stream?: No Erection problems?: No Penile pain?: No  Gastrointestinal Nausea?: No Vomiting?: No Indigestion/heartburn?: No Diarrhea?: No Constipation?: No  Constitutional Fever: No Night sweats?: No Weight loss?: No Fatigue?: No  Skin Skin rash/lesions?: No Itching?: No  Eyes Blurred vision?: No Double vision?: No  Ears/Nose/Throat Sore throat?: No Sinus problems?: No  Hematologic/Lymphatic Swollen glands?: No Easy bruising?: No  Cardiovascular Leg swelling?: No Chest pain?: No  Respiratory Cough?: No Shortness of breath?: No  Endocrine Excessive thirst?: No  Musculoskeletal Back pain?: No Joint pain?: No  Neurological Headaches?: No Dizziness?: No  Psychologic Depression?: No Anxiety?: No  Physical Exam: BP 126/75 (BP Location: Left Arm, Patient Position: Sitting, Cuff Size: Normal)   Pulse (!) 59   Ht 5\' 11"  (1.803 m)   Wt 145 lb (65.8 kg)   BMI 20.22 kg/m   Constitutional:  Alert and oriented, No acute distress. HEENT: [place]  AT, moist mucus membranes.  Trachea midline, no masses. Cardiovascular: No clubbing, cyanosis, or edema. Respiratory: Normal respiratory effort, no increased work of breathing. GI: Abdomen is soft, nontender, nondistended, no abdominal masses GU: Foley catheter draining clear urine   Assessment & Plan:    - Urinary retention Denied previous history of urinary retention.  Foley catheter was removed today for a voiding trial.  They were instructed to call should he have voiding difficulties post catheter removal.  He will return within the next week for a nurse visit/bladder scan for PVR.   Abbie Sons, [place] [address] [place], [place] [address] [phone]

## 2019-08-21 ENCOUNTER — Other Ambulatory Visit: Payer: Self-pay | Admitting: Physician Assistant

## 2019-08-21 ENCOUNTER — Telehealth: Payer: Self-pay

## 2019-08-21 ENCOUNTER — Ambulatory Visit (INDEPENDENT_AMBULATORY_CARE_PROVIDER_SITE_OTHER): Payer: Medicare HMO

## 2019-08-21 DIAGNOSIS — R300 Dysuria: Secondary | ICD-10-CM

## 2019-08-21 DIAGNOSIS — Z87898 Personal history of other specified conditions: Secondary | ICD-10-CM

## 2019-08-21 LAB — BLADDER SCAN AMB NON-IMAGING

## 2019-08-21 LAB — URINALYSIS, COMPLETE
Bilirubin, UA: NEGATIVE
Glucose, UA: NEGATIVE
Ketones, UA: NEGATIVE
Nitrite, UA: POSITIVE — AB
Protein,UA: NEGATIVE
RBC, UA: NEGATIVE
Specific Gravity, UA: 1.01 (ref 1.005–1.030)
Urobilinogen, Ur: 0.2 mg/dL (ref 0.2–1.0)
pH, UA: 6 (ref 5.0–7.5)

## 2019-08-21 LAB — MICROSCOPIC EXAMINATION: RBC: NONE SEEN /hpf (ref 0–2)

## 2019-08-21 MED ORDER — NITROFURANTOIN MONOHYD MACRO 100 MG PO CAPS: 100.0000 mg | ORAL_CAPSULE | Freq: Two times a day (BID) | ORAL | 0 refills | Status: AC

## 2019-08-21 NOTE — Progress Notes (Signed)
In and Out Catheterization  Patient is present today for a I & O catheterization due to dysuria and possible retention. PVR performed prior to cath, PVR :148mL. Patient was cleaned and prepped in a sterile fashion with betadine and Lidocaine 2% jelly was instilled into the urethra.  A 14FR cath was inserted no complications were noted , 48ml of urine return was noted, urine was yellow in color and cloudy. A clean urine sample was collected for u/a and culture. Bladder was drained  And catheter was removed with out difficulty.    Preformed by: Gordy Clement, [place]   Follow up/ Additional notes: RTC this afternoon for PVR. Encouraged pt to push fluids. Plan reviewed with Fidela Salisbury, PA   Pt did not return to clinic for afternoon PVR.

## 2019-08-21 NOTE — Telephone Encounter (Signed)
Called pt to inform him that antibiotics have been sent in for him based on his U/A. No answer. No voicemail or DPR. 1st attempt.

## 2019-08-22 NOTE — Telephone Encounter (Signed)
Pt returned to clinic at 4:30pm. Pt was informed of the need for antibiotics. PVR was performed, result :238mL. Per Fidela Salisbury pt to RTC on 08/23/2019 for repeat PVR to ensure it does not continue to increase. Pt gave verbal understanding.

## 2019-08-23 ENCOUNTER — Ambulatory Visit: Payer: Medicare HMO

## 2019-08-25 LAB — CULTURE, URINE COMPREHENSIVE

## 2019-08-27 ENCOUNTER — Other Ambulatory Visit: Payer: Self-pay | Admitting: Physician Assistant

## 2019-08-27 ENCOUNTER — Telehealth: Payer: Self-pay | Admitting: Physician Assistant

## 2019-08-27 MED ORDER — SULFAMETHOXAZOLE-TRIMETHOPRIM 800-160 MG PO TABS: 1.0000 | ORAL_TABLET | Freq: Two times a day (BID) | ORAL | 0 refills | Status: AC

## 2019-08-27 NOTE — Progress Notes (Signed)
Bacteria resistant to prescribed nitrofurantoin.  Would like to switch to Bactrim DS twice daily x7 days at this time.  Order placed, I contacted the patient however he did not answer the phone and his voicemail box is not set up to receive messages.  Will attempt to contact later.

## 2019-08-27 NOTE — Telephone Encounter (Signed)
Just contacted patient via telephone to advise him of the antibiotic switch previously noted.  He reported continued dysuria.  I advised him to start the new antibiotic as soon as possible.  He expressed an understanding of this plan.

## 2019-08-29 DIAGNOSIS — R29898 Other symptoms and signs involving the musculoskeletal system: Secondary | ICD-10-CM | POA: Diagnosis not present

## 2019-08-29 DIAGNOSIS — M5412 Radiculopathy, cervical region: Secondary | ICD-10-CM | POA: Diagnosis not present

## 2019-08-29 DIAGNOSIS — G621 Alcoholic polyneuropathy: Secondary | ICD-10-CM | POA: Diagnosis not present

## 2019-08-29 DIAGNOSIS — G5601 Carpal tunnel syndrome, right upper limb: Secondary | ICD-10-CM | POA: Diagnosis not present

## 2019-08-29 DIAGNOSIS — G5623 Lesion of ulnar nerve, bilateral upper limbs: Secondary | ICD-10-CM | POA: Diagnosis not present

## 2020-02-11 ENCOUNTER — Telehealth (HOSPITAL_COMMUNITY): Payer: Self-pay

## 2020-02-11 ENCOUNTER — Other Ambulatory Visit: Payer: Self-pay | Admitting: Internal Medicine

## 2020-02-11 ENCOUNTER — Other Ambulatory Visit (HOSPITAL_COMMUNITY): Payer: Self-pay | Admitting: Internal Medicine

## 2020-02-11 DIAGNOSIS — R911 Solitary pulmonary nodule: Secondary | ICD-10-CM

## 2020-03-02 ENCOUNTER — Ambulatory Visit
Admission: RE | Admit: 2020-03-02 | Discharge: 2020-03-02 | Disposition: A | Discharge status: Home / Self Care | Payer: No Typology Code available for payment source | Source: Ambulatory Visit | Attending: Internal Medicine | Admitting: Internal Medicine

## 2020-03-02 ENCOUNTER — Other Ambulatory Visit: Payer: Self-pay

## 2020-03-02 DIAGNOSIS — R911 Solitary pulmonary nodule: Secondary | ICD-10-CM

## 2020-03-02 LAB — GLUCOSE, CAPILLARY: Glucose-Capillary: 89 mg/dL (ref 70–99)

## 2020-03-02 MED ORDER — FLUDEOXYGLUCOSE F - 18 (FDG) INJECTION
8.2900 | Freq: Once | INTRAVENOUS | Status: AC | PRN
  Administered 2020-03-02: 12:00:00 8.29 via INTRAVENOUS

## 2020-04-07 NOTE — Telephone Encounter (Signed)
Close encounter 

## 2020-05-01 ENCOUNTER — Other Ambulatory Visit: Payer: Self-pay

## 2020-05-01 ENCOUNTER — Other Ambulatory Visit: Payer: Self-pay | Admitting: Emergency Medicine

## 2020-05-01 DIAGNOSIS — S32591A Other specified fracture of right pubis, initial encounter for closed fracture: Secondary | ICD-10-CM | POA: Diagnosis not present

## 2020-05-01 DIAGNOSIS — S36116A Major laceration of liver, initial encounter: Secondary | ICD-10-CM | POA: Diagnosis not present

## 2020-05-01 DIAGNOSIS — K661 Hemoperitoneum: Secondary | ICD-10-CM | POA: Diagnosis not present

## 2020-05-01 DIAGNOSIS — S22018A Other fracture of first thoracic vertebra, initial encounter for closed fracture: Secondary | ICD-10-CM | POA: Diagnosis not present

## 2020-05-01 DIAGNOSIS — S82461B Displaced segmental fracture of shaft of right fibula, initial encounter for open fracture type I or II: Secondary | ICD-10-CM | POA: Diagnosis not present

## 2020-05-01 DIAGNOSIS — S3210XA Unspecified fracture of sacrum, initial encounter for closed fracture: Secondary | ICD-10-CM | POA: Diagnosis not present

## 2020-05-01 DIAGNOSIS — T794XXA Traumatic shock, initial encounter: Secondary | ICD-10-CM | POA: Diagnosis not present

## 2020-05-01 DIAGNOSIS — S27892A Contusion of other specified intrathoracic organs, initial encounter: Secondary | ICD-10-CM | POA: Diagnosis not present

## 2020-05-01 DIAGNOSIS — S32511A Fracture of superior rim of right pubis, initial encounter for closed fracture: Secondary | ICD-10-CM | POA: Diagnosis not present

## 2020-05-01 DIAGNOSIS — S82291B Other fracture of shaft of right tibia, initial encounter for open fracture type I or II: Secondary | ICD-10-CM | POA: Diagnosis not present

## 2020-05-01 DIAGNOSIS — S3289XA Fracture of other parts of pelvis, initial encounter for closed fracture: Secondary | ICD-10-CM | POA: Diagnosis not present

## 2020-05-01 DIAGNOSIS — S32048A Other fracture of fourth lumbar vertebra, initial encounter for closed fracture: Secondary | ICD-10-CM | POA: Diagnosis not present

## 2020-05-01 DIAGNOSIS — S37812A Contusion of adrenal gland, initial encounter: Secondary | ICD-10-CM | POA: Diagnosis not present

## 2020-05-01 DIAGNOSIS — J9602 Acute respiratory failure with hypercapnia: Secondary | ICD-10-CM | POA: Diagnosis not present

## 2020-05-01 DIAGNOSIS — S199XXA Unspecified injury of neck, initial encounter: Secondary | ICD-10-CM | POA: Diagnosis not present

## 2020-05-01 DIAGNOSIS — S32302A Unspecified fracture of left ilium, initial encounter for closed fracture: Secondary | ICD-10-CM | POA: Diagnosis not present

## 2020-05-01 DIAGNOSIS — S32491A Other specified fracture of right acetabulum, initial encounter for closed fracture: Secondary | ICD-10-CM | POA: Diagnosis not present

## 2020-05-01 DIAGNOSIS — J9811 Atelectasis: Secondary | ICD-10-CM | POA: Diagnosis not present

## 2020-05-01 DIAGNOSIS — Z4682 Encounter for fitting and adjustment of non-vascular catheter: Secondary | ICD-10-CM | POA: Diagnosis not present

## 2020-05-01 DIAGNOSIS — S36113A Laceration of liver, unspecified degree, initial encounter: Secondary | ICD-10-CM | POA: Diagnosis not present

## 2020-05-01 DIAGNOSIS — S22019A Unspecified fracture of first thoracic vertebra, initial encounter for closed fracture: Secondary | ICD-10-CM | POA: Diagnosis not present

## 2020-05-01 DIAGNOSIS — S271XXA Traumatic hemothorax, initial encounter: Secondary | ICD-10-CM | POA: Diagnosis not present

## 2020-05-01 DIAGNOSIS — R402432 Glasgow coma scale score 3-8, at arrival to emergency department: Secondary | ICD-10-CM | POA: Diagnosis not present

## 2020-05-01 DIAGNOSIS — S2241XA Multiple fractures of ribs, right side, initial encounter for closed fracture: Secondary | ICD-10-CM | POA: Diagnosis not present

## 2020-05-01 DIAGNOSIS — J9601 Acute respiratory failure with hypoxia: Secondary | ICD-10-CM | POA: Diagnosis not present

## 2020-05-01 DIAGNOSIS — S2243XA Multiple fractures of ribs, bilateral, initial encounter for closed fracture: Secondary | ICD-10-CM | POA: Diagnosis not present

## 2020-05-01 DIAGNOSIS — S82831B Other fracture of upper and lower end of right fibula, initial encounter for open fracture type I or II: Secondary | ICD-10-CM | POA: Diagnosis not present

## 2020-05-01 DIAGNOSIS — S065X0A Traumatic subdural hemorrhage without loss of consciousness, initial encounter: Secondary | ICD-10-CM | POA: Diagnosis not present

## 2020-05-01 DIAGNOSIS — S2221XA Fracture of manubrium, initial encounter for closed fracture: Secondary | ICD-10-CM | POA: Diagnosis not present

## 2020-05-01 DIAGNOSIS — S32018A Other fracture of first lumbar vertebra, initial encounter for closed fracture: Secondary | ICD-10-CM | POA: Diagnosis not present

## 2020-05-01 DIAGNOSIS — S299XXA Unspecified injury of thorax, initial encounter: Secondary | ICD-10-CM | POA: Diagnosis not present

## 2020-05-01 DIAGNOSIS — S329XXA Fracture of unspecified parts of lumbosacral spine and pelvis, initial encounter for closed fracture: Secondary | ICD-10-CM | POA: Diagnosis not present

## 2020-05-01 DIAGNOSIS — S0990XA Unspecified injury of head, initial encounter: Secondary | ICD-10-CM | POA: Diagnosis not present

## 2020-05-01 DIAGNOSIS — S14124A Central cord syndrome at C4 level of cervical spinal cord, initial encounter: Secondary | ICD-10-CM | POA: Diagnosis not present

## 2020-05-01 DIAGNOSIS — S065X9A Traumatic subdural hemorrhage with loss of consciousness of unspecified duration, initial encounter: Secondary | ICD-10-CM | POA: Diagnosis not present

## 2020-05-01 DIAGNOSIS — J90 Pleural effusion, not elsewhere classified: Secondary | ICD-10-CM | POA: Diagnosis not present

## 2020-05-01 DIAGNOSIS — S022XXA Fracture of nasal bones, initial encounter for closed fracture: Secondary | ICD-10-CM | POA: Diagnosis not present

## 2020-05-01 DIAGNOSIS — X58XXXA Exposure to other specified factors, initial encounter: Secondary | ICD-10-CM | POA: Diagnosis not present

## 2020-05-01 DIAGNOSIS — S066X0A Traumatic subarachnoid hemorrhage without loss of consciousness, initial encounter: Secondary | ICD-10-CM | POA: Diagnosis not present

## 2020-05-01 DIAGNOSIS — S025XXA Fracture of tooth (traumatic), initial encounter for closed fracture: Secondary | ICD-10-CM | POA: Diagnosis not present

## 2020-05-01 DIAGNOSIS — T80818A Extravasation of other vesicant agent, initial encounter: Secondary | ICD-10-CM | POA: Diagnosis not present

## 2020-05-01 DIAGNOSIS — I609 Nontraumatic subarachnoid hemorrhage, unspecified: Secondary | ICD-10-CM | POA: Diagnosis not present

## 2020-05-01 DIAGNOSIS — I517 Cardiomegaly: Secondary | ICD-10-CM | POA: Diagnosis not present

## 2020-05-01 DIAGNOSIS — K762 Central hemorrhagic necrosis of liver: Secondary | ICD-10-CM | POA: Diagnosis not present

## 2020-05-01 DIAGNOSIS — I7101 Dissection of thoracic aorta: Secondary | ICD-10-CM | POA: Diagnosis not present

## 2020-05-01 DIAGNOSIS — S82261B Displaced segmental fracture of shaft of right tibia, initial encounter for open fracture type I or II: Secondary | ICD-10-CM | POA: Diagnosis not present

## 2020-05-01 DIAGNOSIS — S36898A Other injury of other intra-abdominal organs, initial encounter: Secondary | ICD-10-CM | POA: Diagnosis not present

## 2020-05-01 DIAGNOSIS — M4804 Spinal stenosis, thoracic region: Secondary | ICD-10-CM | POA: Diagnosis not present

## 2020-05-01 DIAGNOSIS — S22028A Other fracture of second thoracic vertebra, initial encounter for closed fracture: Secondary | ICD-10-CM | POA: Diagnosis not present

## 2020-05-01 DIAGNOSIS — I739 Peripheral vascular disease, unspecified: Secondary | ICD-10-CM | POA: Diagnosis not present

## 2020-05-01 DIAGNOSIS — R58 Hemorrhage, not elsewhere classified: Secondary | ICD-10-CM | POA: Diagnosis not present

## 2020-05-01 DIAGNOSIS — D62 Acute posthemorrhagic anemia: Secondary | ICD-10-CM | POA: Diagnosis not present

## 2020-05-01 DIAGNOSIS — S32301A Unspecified fracture of right ilium, initial encounter for closed fracture: Secondary | ICD-10-CM | POA: Diagnosis not present

## 2020-05-01 DIAGNOSIS — T1490XA Injury, unspecified, initial encounter: Secondary | ICD-10-CM | POA: Diagnosis not present

## 2020-05-01 DIAGNOSIS — Y33XXXA Other specified events, undetermined intent, initial encounter: Secondary | ICD-10-CM | POA: Diagnosis not present

## 2020-05-02 ENCOUNTER — Other Ambulatory Visit: Payer: Self-pay | Admitting: Emergency Medicine

## 2020-05-02 LAB — BPAM RBC
Blood Product Expiration Date: 202106082359
Blood Product Expiration Date: 202106082359
Blood Product Expiration Date: 202106082359
Blood Product Expiration Date: 202106082359
ISSUE DATE / TIME: 202105111545
ISSUE DATE / TIME: 202105141410
ISSUE DATE / TIME: 202105141410
Unit Type and Rh: 5100
Unit Type and Rh: 5100
Unit Type and Rh: 5100
Unit Type and Rh: 5100

## 2020-05-03 LAB — TYPE AND SCREEN
Unit division: 0
Unit division: 0

## 2020-05-03 LAB — BPAM RBC
Blood Product Expiration Date: 202106082359
Blood Product Expiration Date: 202106082359
ISSUE DATE / TIME: 202105141410
ISSUE DATE / TIME: 202105141410
Unit Type and Rh: 5100
Unit Type and Rh: 5100

## 2020-05-04 MED ORDER — SODIUM CHLORIDE 0.9 % IV SOLN
INTRAVENOUS | Status: DC
Start: ? — End: 2020-05-04

## 2020-05-04 MED ORDER — ESMOLOL HCL-SODIUM CHLORIDE 2000 MG/100ML IV SOLN
25.00 | INTRAVENOUS | Status: DC
Start: ? — End: 2020-05-04

## 2020-05-04 MED ORDER — GENERIC EXTERNAL MEDICATION
40.00 | Status: DC
Start: ? — End: 2020-05-04

## 2020-05-04 MED ORDER — LORAZEPAM 2 MG/ML IJ SOLN
2.00 | INTRAMUSCULAR | Status: DC
Start: ? — End: 2020-05-04

## 2020-05-04 MED ORDER — GLYCOPYRROLATE 0.4 MG/2ML IJ SOLN
0.20 | INTRAMUSCULAR | Status: DC
Start: ? — End: 2020-05-04

## 2020-05-04 MED ORDER — CEFAZOLIN SODIUM-DEXTROSE 2-4 GM/100ML-% IV SOLN
2.00 | INTRAVENOUS | Status: DC
Start: 2020-05-02 — End: 2020-05-04

## 2020-05-04 MED ORDER — NICARDIPINE HCL IN DEXTROSE 40-5 MG/200ML-% IV SOLN
3.00 | INTRAVENOUS | Status: DC
Start: ? — End: 2020-05-04

## 2020-05-04 MED ORDER — GENERIC EXTERNAL MEDICATION
Status: DC
Start: ? — End: 2020-05-04

## 2020-05-04 MED ORDER — PROPOFOL 100 MG/10ML IV EMUL
5.00 | INTRAVENOUS | Status: DC
Start: ? — End: 2020-05-04

## 2020-05-04 MED ORDER — GENERIC EXTERNAL MEDICATION
100.00 | Status: DC
Start: ? — End: 2020-05-04

## 2020-05-19 DEATH — deceased

## 2020-06-30 ENCOUNTER — Emergency Department
Admission: EM | Admit: 2020-06-30 | Discharge: 2020-06-30 | Disposition: A | Discharge status: Home / Self Care | Payer: No Typology Code available for payment source | Attending: Student in an Organized Health Care Education/Training Program | Admitting: Student in an Organized Health Care Education/Training Program

## 2020-06-30 ENCOUNTER — Other Ambulatory Visit: Payer: Self-pay

## 2020-06-30 ENCOUNTER — Emergency Department: Payer: No Typology Code available for payment source

## 2020-06-30 DIAGNOSIS — R109 Unspecified abdominal pain: Secondary | ICD-10-CM | POA: Diagnosis not present

## 2020-06-30 DIAGNOSIS — I10 Essential (primary) hypertension: Secondary | ICD-10-CM | POA: Diagnosis not present

## 2020-06-30 DIAGNOSIS — F17290 Nicotine dependence, other tobacco product, uncomplicated: Secondary | ICD-10-CM | POA: Diagnosis not present

## 2020-06-30 DIAGNOSIS — I129 Hypertensive chronic kidney disease with stage 1 through stage 4 chronic kidney disease, or unspecified chronic kidney disease: Secondary | ICD-10-CM | POA: Insufficient documentation

## 2020-06-30 DIAGNOSIS — Z79899 Other long term (current) drug therapy: Secondary | ICD-10-CM | POA: Diagnosis not present

## 2020-06-30 DIAGNOSIS — N183 Chronic kidney disease, stage 3 unspecified: Secondary | ICD-10-CM | POA: Diagnosis not present

## 2020-06-30 DIAGNOSIS — N390 Urinary tract infection, site not specified: Secondary | ICD-10-CM | POA: Diagnosis not present

## 2020-06-30 LAB — BASIC METABOLIC PANEL
Anion gap: 6 (ref 5–15)
BUN: 14 mg/dL (ref 8–23)
CO2: 23 mmol/L (ref 22–32)
Calcium: 8.2 mg/dL — ABNORMAL LOW (ref 8.9–10.3)
Chloride: 101 mmol/L (ref 98–111)
Creatinine, Ser: 1.13 mg/dL (ref 0.61–1.24)
GFR calc Af Amer: >60 mL/min (ref >60)
GFR calc non Af Amer: >60 mL/min (ref >60)
Glucose, Bld: 84 mg/dL (ref 70–99)
Potassium: 3.8 mmol/L (ref 3.5–5.1)
Sodium: 130 mmol/L — ABNORMAL LOW (ref 135–145)

## 2020-06-30 LAB — URINALYSIS, COMPLETE (UACMP) WITH MICROSCOPIC
Bilirubin Urine: NEGATIVE
Glucose, UA: NEGATIVE mg/dL
Ketones, ur: NEGATIVE mg/dL
Nitrite: NEGATIVE
Protein, ur: NEGATIVE mg/dL
Specific Gravity, Urine: 1.002 — ABNORMAL LOW (ref 1.005–1.030)
pH: 5 (ref 5.0–8.0)

## 2020-06-30 LAB — CBC
HCT: 28.7 % — ABNORMAL LOW (ref 39.0–52.0)
Hemoglobin: 9.8 g/dL — ABNORMAL LOW (ref 13.0–17.0)
MCH: 26.4 pg (ref 26.0–34.0)
MCHC: 34.1 g/dL (ref 30.0–36.0)
MCV: 77.4 fL — ABNORMAL LOW (ref 80.0–100.0)
Platelets: 149 10*3/uL — ABNORMAL LOW (ref 150–400)
RBC: 3.71 MIL/uL — ABNORMAL LOW (ref 4.22–5.81)
RDW: 14.6 % (ref 11.5–15.5)
WBC: 2.4 10*3/uL — ABNORMAL LOW (ref 4.0–10.5)
nRBC: 0 % (ref 0.0–0.2)

## 2020-06-30 MED ORDER — POLYETHYLENE GLYCOL 3350 17 G PO PACK
17.0000 g | PACK | Freq: Once | ORAL | 0 refills | Status: AC
Start: 2020-06-30 — End: 2020-06-30

## 2020-06-30 MED ORDER — AMLODIPINE BESYLATE 5 MG PO TABS
10.0000 mg | ORAL_TABLET | Freq: Once | ORAL | Status: AC
  Administered 2020-06-30: 10 mg via ORAL
  Filled 2020-06-30: qty 2

## 2020-06-30 MED ORDER — CEPHALEXIN 500 MG PO CAPS
500.0000 mg | ORAL_CAPSULE | Freq: Once | ORAL | Status: AC
  Administered 2020-06-30: 500 mg via ORAL
  Filled 2020-06-30: qty 1

## 2020-06-30 MED ORDER — CEPHALEXIN 500 MG PO CAPS
500.0000 mg | ORAL_CAPSULE | Freq: Three times a day (TID) | ORAL | 0 refills | Status: AC
Start: 2020-06-30 — End: 2020-07-07

## 2020-06-30 MED ORDER — POLYETHYLENE GLYCOL 3350 17 G PO PACK
17.0000 g | PACK | Freq: Once | ORAL | 0 refills | Status: DC
Start: 2020-06-30 — End: 2020-06-30

## 2020-06-30 NOTE — ED Provider Notes (Signed)
[facility] Emergency Department Provider Note    First MD Initiated Contact with Patient 06/30/20 [phone]     (approximate)  I have reviewed the triage vital signs and the nursing notes.   HISTORY  Chief Complaint Flank Pain    HPI Gregory Hurley is a 74 y.o. male with the below listed past medical history presents to the ER for evaluation of left flank pain as well as burning dysuria for the past 2 days.  Does have a history of cystitis that was treated with antibiotics several months ago.  Denies any fevers or chills.  No chest pain or shortness of breath.  No numbness or tingling.  States he also has a history of constipation and has been taking stool softeners without any improvement.  Denies any numbness or tingling.    Past Medical History:  Diagnosis Date  . Anemia   . CKD (chronic kidney disease), stage III   . Elevated alkaline phosphatase level   . History of fracture of clavicle   . History of fracture of rib   . Hypertension   . Pancytopenia ([place])   . PTSD (post-traumatic stress disorder)    FOLLOWED AT [facility]  . Thrombocytopenia ([place])    Family History  Problem Relation Age of Onset  . Breast cancer Mother   . Colon cancer Father   . Prostate cancer Father    Past Surgical History:  Procedure Laterality Date  . NO PAST SURGERIES     Patient Active Problem List   Diagnosis Date Noted  . PTSD (post-traumatic stress disorder) 06/25/2018  . Tetraplegia ([place]) 06/25/2018  . Labile blood pressure   . Drug induced constipation   . Acute blood loss anemia   . Central cord syndrome ([place]) 06/07/2018  . Syncope 07/07/2017  . HTN (hypertension) 07/07/2017  . Depression 07/07/2017  . CKD (chronic kidney disease), stage III 07/07/2017  . Hyponatremia 07/07/2017  . Hypokalemia 07/07/2017      Prior to Admission medications   Medication Sig Start Date End Date Taking? Authorizing Provider  acetaminophen (TYLENOL) 325 MG tablet Take  1-2 tablets (325-650 mg total) by mouth every 4 (four) hours as needed for mild pain. 06/22/18   Love, Ivan Anchors, PA-C  albuterol (PROVENTIL HFA;VENTOLIN HFA) 108 (90 Base) MCG/ACT inhaler Inhale into the lungs every 6 (six) hours as needed for wheezing or shortness of breath.    [provider]  amLODipine (NORVASC) 10 MG tablet Take 1 tablet (10 mg total) by mouth daily. 06/22/18   Love, Ivan Anchors, PA-C  bisacodyl (DULCOLAX) 10 MG suppository Place 1 suppository (10 mg total) rectally daily at 6 (six) AM. 06/23/18   Love, Ivan Anchors, PA-C  carvedilol (COREG) 3.125 MG tablet Take 1 tablet (3.125 mg total) by mouth 2 (two) times daily with a meal. 06/22/18   Love, Ivan Anchors, PA-C  cephALEXin (KEFLEX) 500 MG capsule Take 1 capsule (500 mg total) by mouth 3 (three) times daily for 7 days. 06/30/20 07/07/20  Merlyn Lot, MD  cloNIDine (CATAPRES) 0.2 MG tablet Take 1 tablet (0.2 mg total) by mouth 2 (two) times daily. 06/22/18   Love, Ivan Anchors, PA-C  gabapentin (NEURONTIN) 100 MG capsule Take 1 capsule (100 mg total) by mouth at bedtime. 06/22/18   Love, Ivan Anchors, PA-C  hydrALAZINE (APRESOLINE) 25 MG tablet Take 1 tablet (25 mg total) by mouth every 8 (eight) hours. 06/22/18   Love, Ivan Anchors, PA-C  latanoprost (XALATAN) 0.005 % ophthalmic  solution Place 1 drop into both eyes at bedtime.    [provider]  lidocaine (LIDODERM) 5 % Apply to left shoulder at 7 am and remove at 7 pm daily.  PURCHASE THIS OVER THE COUNTER. 06/22/18   Love, Ivan Anchors, PA-C  Melatonin 3 MG TABS Take 2 tablets (6 mg total) by mouth at bedtime. IS OVER THE COUNTER. USED FOR SLEEP 06/22/18   Love, Ivan Anchors, PA-C  PARoxetine (PAXIL) 40 MG tablet Take 1 tablet (40 mg total) by mouth daily. 06/22/18   Love, Ivan Anchors, PA-C  polyethylene glycol (MIRALAX / GLYCOLAX) 17 g packet Take 17 g by mouth once for 1 dose. Mix one tablespoon with 8oz of your favorite juice or water every day until you are having soft formed stools. Then start taking  once daily if you didn't have a stool the day before. 06/30/20 06/30/20  Merlyn Lot, MD  polyethylene glycol [facility] Floria [facility]aveling) packet Take 17 g by mouth daily. 06/23/18   Love, Ivan Anchors, PA-C  traZODone (DESYREL) 100 MG tablet Take 1 tablet (100 mg total) by mouth at bedtime. 06/22/18   Love, Ivan Anchors, PA-C  vitamin B-12 (CYANOCOBALAMIN) 1000 MCG tablet Take 1,000 mcg by mouth daily.     [provider]    Allergies Ace inhibitors    Social History Social History   Tobacco Use  . Smoking status: Current Every Day Smoker    Packs/day: 1.00    Years: 50.00    Pack years: 50.00    Types: Cigars  . Smokeless tobacco: Never Used  . Tobacco comment: 50 PACK YR HX SMOKING SO HIGH RISK LUNG CANCER  Vaping Use  . Vaping Use: Never used  Substance Use Topics  . Alcohol use: Yes    Alcohol/week: 0.0 standard drinks    Comment: "reports no alcohol use in 5-7 days"  . Drug use: Yes    Comment: H/X OF CANNABIS ABUSE    Review of Systems Patient denies headaches, rhinorrhea, blurry vision, numbness, shortness of breath, chest pain, edema, cough, abdominal pain, nausea, vomiting, diarrhea, dysuria, fevers, rashes or hallucinations unless otherwise stated above in HPI. ____________________________________________   PHYSICAL EXAM:  VITAL SIGNS: Vitals:   06/30/20 0221 06/30/20 0736  BP: (!) 150/76 (!) 222/94  Pulse: 60 63  Resp: 16 18  Temp: 98 F (36.7 C) 97.8 F (36.6 C)  SpO2: 100% 100%    Constitutional: Alert and oriented.  Eyes: Conjunctivae are normal.  Head: Atraumatic. Nose: No congestion/rhinnorhea. Mouth/Throat: Mucous membranes are moist.   Neck: No stridor. Painless ROM.  Cardiovascular: Normal rate, regular rhythm. Grossly normal heart sounds.  Good peripheral circulation. Respiratory: Normal respiratory effort.  No retractions. Lungs CTAB. Gastrointestinal: Soft and nontender. No distention. No abdominal bruits. No CVA tenderness. Genitourinary:    Musculoskeletal: No lower extremity tenderness nor edema.  No joint effusions. Neurologic:  Normal speech and language. No gross focal neurologic deficits are appreciated. No facial droop Skin:  Skin is warm, dry and intact. No rash noted. Psychiatric: Mood and affect are normal. Speech and behavior are normal.  ____________________________________________   LABS (all labs ordered are listed, but only abnormal results are displayed)  Results for orders placed or performed during the hospital encounter of 06/30/20 (from the past 24 hour(s))  CBC     Status: Abnormal   Collection Time: 06/30/20  2:23 AM  Result Value Ref Range   WBC 2.4 (L) 4.0 - 10.5 K/uL   RBC 3.71 (L)  4.22 - 5.81 MIL/uL   Hemoglobin 9.8 (L) 13.0 - 17.0 g/dL   HCT 28.7 (L) 39 - 52 %   MCV 77.4 (L) 80.0 - 100.0 fL   MCH 26.4 26.0 - 34.0 pg   MCHC 34.1 30.0 - 36.0 g/dL   RDW 14.6 11.5 - 15.5 %   Platelets 149 (L) 150 - 400 K/uL   nRBC 0.0 0.0 - 0.2 %  Basic metabolic panel     Status: Abnormal   Collection Time: 06/30/20  2:23 AM  Result Value Ref Range   Sodium 130 (L) 135 - 145 mmol/L   Potassium 3.8 3.5 - 5.1 mmol/L   Chloride 101 98 - 111 mmol/L   CO2 23 22 - 32 mmol/L   Glucose, Bld 84 70 - 99 mg/dL   BUN 14 8 - 23 mg/dL   Creatinine, Ser 1.13 0.61 - 1.24 mg/dL   Calcium 8.2 (L) 8.9 - 10.3 mg/dL   GFR calc non Af Amer >60 >60 mL/min   GFR calc Af Amer >60 >60 mL/min   Anion gap 6 5 - 15  Urinalysis, Complete w Microscopic     Status: Abnormal   Collection Time: 06/30/20  2:23 AM  Result Value Ref Range   Color, Urine STRAW (A) YELLOW   APPearance CLEAR (A) CLEAR   Specific Gravity, Urine 1.002 (L) 1.005 - 1.030   pH 5.0 5.0 - 8.0   Glucose, UA NEGATIVE NEGATIVE mg/dL   Hgb urine dipstick SMALL (A) NEGATIVE   Bilirubin Urine NEGATIVE NEGATIVE   Ketones, ur NEGATIVE NEGATIVE mg/dL   Protein, ur NEGATIVE NEGATIVE mg/dL   Nitrite NEGATIVE NEGATIVE   Leukocytes,Ua SMALL (A) NEGATIVE   RBC / HPF 0-5  0 - 5 RBC/hpf   WBC, UA 6-10 0 - 5 WBC/hpf   Bacteria, UA MANY (A) NONE SEEN   Squamous Epithelial / LPF 0-5 0 - 5   Mucus PRESENT    ____________________________________________ ____________________________________________  RADIOLOGY  I personally reviewed all radiographic images ordered to evaluate for the above acute complaints and reviewed radiology reports and findings.  These findings were personally discussed with the patient.  Please see medical record for radiology report.  ____________________________________________   PROCEDURES  Procedure(s) performed:  Procedures    Critical Care performed: no ____________________________________________   INITIAL IMPRESSION / ASSESSMENT AND PLAN / ED COURSE  Pertinent labs & imaging results that were available during my care of the patient were reviewed by me and considered in my medical decision making (see chart for details).   DDX: Cystitis, [place], prostatitis, stone, colitis, hernia  Nilesh Stegall is a 74 y.o. who presents to the ED with symptoms as described above.  Patient nontoxic-appearing in no acute distress.  Is hypertensive but states he did not take his morning medications since been in the waiting room since early this morning.  On review of his past medical records had Klebsiella UTI that was sensitive to cephalosporins.  No signs of obstructing stone.  Will order antibiotics.  Discussed signs and symptoms for which the patient should return to the ER.     The patient was evaluated in Emergency Department today for the symptoms described in the history of present illness. He/she was evaluated in the context of the global COVID-19 pandemic, which necessitated consideration that the patient might be at risk for infection with the SARS-CoV-2 virus that causes COVID-19. Institutional protocols and algorithms that pertain to the evaluation of patients at risk for COVID-19 are in  a state of rapid change based on information  released by regulatory bodies including the CDC and federal and state organizations. These policies and algorithms were followed during the patient's care in the ED.  As part of my medical decision making, I reviewed the following data within the [place] notes reviewed and incorporated, Labs reviewed, notes from prior ED visits and [place] Controlled Substance Database   ____________________________________________   FINAL CLINICAL IMPRESSION(S) / ED DIAGNOSES  Final diagnoses:  Flank pain  Lower urinary tract infectious disease      NEW MEDICATIONS STARTED DURING THIS VISIT:  New Prescriptions   CEPHALEXIN (KEFLEX) 500 MG CAPSULE    Take 1 capsule (500 mg total) by mouth 3 (three) times daily for 7 days.   POLYETHYLENE GLYCOL (MIRALAX / GLYCOLAX) 17 G PACKET    Take 17 g by mouth once for 1 dose. Mix one tablespoon with 8oz of your favorite juice or water every day until you are having soft formed stools. Then start taking once daily if you didn't have a stool the day before.     Note:  This document was prepared using Dragon voice recognition software and may include unintentional dictation errors.    Merlyn Lot, MD 06/30/20 [id number]

## 2020-06-30 NOTE — ED Notes (Signed)
ED Provider at bedside. 

## 2020-06-30 NOTE — ED Triage Notes (Signed)
Left sided flank pain and malodorous urine X 2 days. Pt alert and oriented X4, cooperative, RR even and unlabored, color WNL. Pt in NAD.

## 2020-06-30 NOTE — Discharge Instructions (Signed)
  IMPRESSION:  1. Constipated appearance.  2. No hydronephrosis or visible urinary inflammation.  3.  Aortic Atherosclerosis (ICD10-I70.0).  4. Avascular necrosis of the femoral heads which has occurred since a 2016 comparison.   Please follow up with PCP and I have given you a referral to orthopedics as well.

## 2020-10-17 ENCOUNTER — Emergency Department
Admission: EM | Admit: 2020-10-17 | Discharge: 2020-10-17 | Disposition: A | Discharge status: Home / Self Care | Payer: No Typology Code available for payment source | Attending: Emergency Medicine | Admitting: Emergency Medicine

## 2020-10-17 ENCOUNTER — Other Ambulatory Visit: Payer: Self-pay

## 2020-10-17 DIAGNOSIS — N183 Chronic kidney disease, stage 3 unspecified: Secondary | ICD-10-CM | POA: Diagnosis not present

## 2020-10-17 DIAGNOSIS — F17290 Nicotine dependence, other tobacco product, uncomplicated: Secondary | ICD-10-CM | POA: Insufficient documentation

## 2020-10-17 DIAGNOSIS — I129 Hypertensive chronic kidney disease with stage 1 through stage 4 chronic kidney disease, or unspecified chronic kidney disease: Secondary | ICD-10-CM | POA: Insufficient documentation

## 2020-10-17 DIAGNOSIS — R339 Retention of urine, unspecified: Secondary | ICD-10-CM | POA: Diagnosis present

## 2020-10-17 DIAGNOSIS — Z79899 Other long term (current) drug therapy: Secondary | ICD-10-CM | POA: Insufficient documentation

## 2020-10-17 DIAGNOSIS — N390 Urinary tract infection, site not specified: Secondary | ICD-10-CM | POA: Insufficient documentation

## 2020-10-17 LAB — URINALYSIS, COMPLETE (UACMP) WITH MICROSCOPIC
Bilirubin Urine: NEGATIVE
Glucose, UA: NEGATIVE mg/dL
Ketones, ur: NEGATIVE mg/dL
Nitrite: POSITIVE — AB
Protein, ur: 100 mg/dL — AB
Specific Gravity, Urine: 1.012 (ref 1.005–1.030)
Squamous Epithelial / HPF: NONE SEEN (ref 0–5)
WBC, UA: >50 WBC/hpf — ABNORMAL HIGH (ref 0–5)
pH: 5 (ref 5.0–8.0)

## 2020-10-17 MED ORDER — SULFAMETHOXAZOLE-TRIMETHOPRIM 800-160 MG PO TABS
1.0000 | ORAL_TABLET | Freq: Two times a day (BID) | ORAL | 0 refills | Status: DC
Start: 2020-10-17 — End: 2021-12-08

## 2020-10-17 MED ORDER — SULFAMETHOXAZOLE-TRIMETHOPRIM 800-160 MG PO TABS
1.0000 | ORAL_TABLET | Freq: Once | ORAL | Status: AC
  Administered 2020-10-17: 1 via ORAL
  Filled 2020-10-17: qty 1

## 2020-10-17 NOTE — ED Provider Notes (Signed)
[facility] Emergency Department Provider Note   ____________________________________________   First MD Initiated Contact with Patient 10/17/20 1334     (approximate)  I have reviewed the triage vital signs and the nursing notes.   HISTORY  Chief Complaint Urinary Tract Infection    HPI Gregory Hurley is a 74 y.o. male patient presents with foul-smelling urine and penile itching. Patient has a history of recurrent UTIs. Patient stated times he is a urinary retention. Patient states able to void at this time. It was also noted the patient blood pressure is 194/77. Patient state he is on 2/3 blood pressure medication but he did not know the name and stated his daughter normally manages medications. Patient is managed by the [facility] and we do not have access to their records. I requisition patient is taking Norvasc at 10 mg and Coreg 3.125. Patient believes is another blood pressure medications. We will try to contact the daughter to ascertain his medications and correct dosage.         Past Medical History:  Diagnosis Date  . Anemia   . CKD (chronic kidney disease), stage III ([place])   . Elevated alkaline phosphatase level   . History of fracture of clavicle   . History of fracture of rib   . Hypertension   . Pancytopenia ([place])   . PTSD (post-traumatic stress disorder)    FOLLOWED AT [facility]  . Thrombocytopenia [facility])     Patient Active Problem List   Diagnosis Date Noted  . PTSD (post-traumatic stress disorder) 06/25/2018  . Tetraplegia ([place]) 06/25/2018  . Labile blood pressure   . Drug induced constipation   . Acute blood loss anemia   . Central cord syndrome ([place]) 06/07/2018  . Syncope 07/07/2017  . HTN (hypertension) 07/07/2017  . Depression 07/07/2017  . CKD (chronic kidney disease), stage III ([place]) 07/07/2017  . Hyponatremia 07/07/2017  . Hypokalemia 07/07/2017    Past Surgical History:  Procedure Laterality Date  .  NO PAST SURGERIES      Prior to Admission medications   Medication Sig Start Date End Date Taking? Authorizing Provider  acetaminophen (TYLENOL) 325 MG tablet Take 1-2 tablets (325-650 mg total) by mouth every 4 (four) hours as needed for mild pain. 06/22/18   Love, Ivan Anchors, PA-C  albuterol (PROVENTIL HFA;VENTOLIN HFA) 108 (90 Base) MCG/ACT inhaler Inhale into the lungs every 6 (six) hours as needed for wheezing or shortness of breath.    [provider]  amLODipine (NORVASC) 10 MG tablet Take 1 tablet (10 mg total) by mouth daily. 06/22/18   Love, Ivan Anchors, PA-C  bisacodyl (DULCOLAX) 10 MG suppository Place 1 suppository (10 mg total) rectally daily at 6 (six) AM. 06/23/18   Love, Ivan Anchors, PA-C  carvedilol (COREG) 3.125 MG tablet Take 1 tablet (3.125 mg total) by mouth 2 (two) times daily with a meal. 06/22/18   Love, Ivan Anchors, PA-C  cloNIDine (CATAPRES) 0.2 MG tablet Take 1 tablet (0.2 mg total) by mouth 2 (two) times daily. 06/22/18   Love, Ivan Anchors, PA-C  gabapentin (NEURONTIN) 100 MG capsule Take 1 capsule (100 mg total) by mouth at bedtime. 06/22/18   Love, Ivan Anchors, PA-C  hydrALAZINE (APRESOLINE) 25 MG tablet Take 1 tablet (25 mg total) by mouth every 8 (eight) hours. 06/22/18   Love, Ivan Anchors, PA-C  latanoprost (XALATAN) 0.005 % ophthalmic solution Place 1 drop into both eyes at bedtime.    [provider]  lidocaine (  LIDODERM) 5 % Apply to left shoulder at 7 am and remove at 7 pm daily.  PURCHASE THIS OVER THE COUNTER. 06/22/18   Love, Ivan Anchors, PA-C  Melatonin 3 MG TABS Take 2 tablets (6 mg total) by mouth at bedtime. IS OVER THE COUNTER. USED FOR SLEEP 06/22/18   Love, Ivan Anchors, PA-C  PARoxetine (PAXIL) 40 MG tablet Take 1 tablet (40 mg total) by mouth daily. 06/22/18   Love, Ivan Anchors, PA-C  polyethylene glycol (MIRALAX / GLYCOLAX) packet Take 17 g by mouth daily. 06/23/18   Love, Ivan Anchors, PA-C  sulfamethoxazole-trimethoprim (BACTRIM DS) 800-160 MG tablet Take 1 tablet by mouth 2 (two)  times daily. 10/17/20   Sable Feil, PA-C  traZODone (DESYREL) 100 MG tablet Take 1 tablet (100 mg total) by mouth at bedtime. 06/22/18   Love, Ivan Anchors, PA-C  vitamin B-12 (CYANOCOBALAMIN) 1000 MCG tablet Take 1,000 mcg by mouth daily.     [provider]    Allergies Ace inhibitors  Family History  Problem Relation Age of Onset  . Breast cancer Mother   . Colon cancer Father   . Prostate cancer Father     Social History Social History   Tobacco Use  . Smoking status: Current Every Day Smoker    Packs/day: 1.00    Years: 50.00    Pack years: 50.00    Types: Cigars  . Smokeless tobacco: Never Used  . Tobacco comment: 50 PACK YR HX SMOKING SO HIGH RISK LUNG CANCER  Vaping Use  . Vaping Use: Never used  Substance Use Topics  . Alcohol use: Yes    Alcohol/week: 0.0 standard drinks    Comment: "reports no alcohol use in 5-7 days"  . Drug use: Yes    Comment: H/X OF CANNABIS ABUSE    Review of Systems Constitutional: No fever/chills Eyes: No visual changes. ENT: No sore throat. Cardiovascular: Denies chest pain. Respiratory: Denies shortness of breath. Gastrointestinal: No abdominal pain.  No nausea, no vomiting.  No diarrhea.  No constipation. Genitourinary: Negative for dysuria. Musculoskeletal: Negative for back pain. Skin: Negative for rash. Neurological: Negative for headaches, focal weakness or numbness. Psychiatric:  PTSD Endocrine:  Hypertension Hematological/Lymphatic:  Allergic/Immunilogical: **}  ____________________________________________   PHYSICAL EXAM:  VITAL SIGNS: ED Triage Vitals [10/17/20 1319]  Enc Vitals Group     BP (!) 194/77     Pulse Rate (!) 53     Resp 15     Temp 98.4 F (36.9 C)     Temp Source Oral     SpO2 100 %     Weight 150 lb (68 kg)     Height 5\' 11"  (1.803 m)     Head Circumference      Peak Flow      Pain Score 0     Pain Loc      Pain Edu?      Excl. in [place]?     Constitutional: Alert and  oriented. Well appearing and in no acute distress. Eyes: Conjunctivae are normal. PERRL. EOMI. Head: Atraumatic. Nose: No congestion/rhinnorhea. Mouth/Throat: Mucous membranes are moist.  Oropharynx non-erythematous. Neck: No stridor.  Hematological/Lymphatic/Immunilogical: No cervical lymphadenopathy. Cardiovascular: Normal rate, regular rhythm. Grossly normal heart sounds.  Good peripheral circulation. Elevated blood pressure. We just ascertain from the daughter that the patient took his blood pressure medicine just prior to arrival to this facility. Respiratory: Normal respiratory effort.  No retractions. Lungs CTAB. Gastrointestinal: Soft and nontender. No distention. No abdominal  bruits. No CVA tenderness. Genitourinary: No no lesions or discharge. Musculoskeletal: No lower extremity tenderness nor edema.  No joint effusions. Neurologic:  Normal speech and language. No gross focal neurologic deficits are appreciated. No gait instability. Skin:  Skin is warm, dry and intact. No rash noted. Psychiatric: Mood and affect are normal. Speech and behavior are normal.  ____________________________________________   LABS (all labs ordered are listed, but only abnormal results are displayed)  Labs Reviewed  URINALYSIS, COMPLETE (UACMP) WITH MICROSCOPIC - Abnormal; Notable for the following components:      Result Value   Color, Urine YELLOW (*)    APPearance CLOUDY (*)    Hgb urine dipstick SMALL (*)    Protein, ur 100 (*)    Nitrite POSITIVE (*)    Leukocytes,Ua LARGE (*)    WBC, UA >50 (*)    Bacteria, UA MANY (*)    All other components within normal limits  URINE CULTURE   ____________________________________________  EKG   ____________________________________________  RADIOLOGY I, Sable Feil, personally viewed and evaluated these images (plain radiographs) as part of my medical decision making, as well as reviewing the written report by the radiologist.  ED MD  interpretation:    Official radiology report(s): No results found.  ____________________________________________   PROCEDURES  Procedure(s) performed (including Critical Care):  Procedures   ____________________________________________   INITIAL IMPRESSION / ASSESSMENT AND PLAN / ED COURSE  As part of my medical decision making, I reviewed the following data within the [place]     Patient complaining of dysuria and foul-smelling urine. Patient urinalysis consistent with urinary tract infection. Spoke with daughter in front of the patient blood pressure is elevated secondary to not taking his medication on time. Urine culture is pending. Patient prescribed Bactrim DS and advised to follow-up with PCP when he finished the antibiotics. Return to ED if condition worsens.        ____________________________________________   FINAL CLINICAL IMPRESSION(S) / ED DIAGNOSES  Final diagnoses:  Lower urinary tract infectious disease     ED Discharge Orders         Ordered    sulfamethoxazole-trimethoprim (BACTRIM DS) 800-160 MG tablet  2 times daily        10/17/20 1501          *Please note:  Gregory Hurley was evaluated in Emergency Department on 10/17/2020 for the symptoms described in the history of present illness. He was evaluated in the context of the global COVID-19 pandemic, which necessitated consideration that the patient might be at risk for infection with the SARS-CoV-2 virus that causes COVID-19. Institutional protocols and algorithms that pertain to the evaluation of patients at risk for COVID-19 are in a state of rapid change based on information released by regulatory bodies including the CDC and federal and state organizations. These policies and algorithms were followed during the patient's care in the ED.  Some ED evaluations and interventions may be delayed as a result of limited staffing during and the pandemic.*   Note:  This document was  prepared using Dragon voice recognition software and may include unintentional dictation errors.    Sable Feil, PA-C 10/17/20 1503    Delman Kitten, MD 10/17/20 1536

## 2020-10-17 NOTE — ED Triage Notes (Signed)
Pt c/o penile itching and foul smelling urine - Pt reports that he had a UTI previously and that had to place cath to obtain urine - Pt does not have cath at this time and states that he is voiding without difficulty at this time - Denies dysuria

## 2020-10-17 NOTE — ED Notes (Signed)
Pt daughter [name] is waiting in car [phone]

## 2020-10-20 LAB — URINE CULTURE: Culture: >=100000 — AB

## 2021-05-18 ENCOUNTER — Emergency Department
Admission: EM | Admit: 2021-05-18 | Discharge: 2021-05-18 | Disposition: A | Discharge status: Home / Self Care | Payer: Medicare Other | Attending: Emergency Medicine | Admitting: Emergency Medicine

## 2021-05-18 ENCOUNTER — Other Ambulatory Visit: Payer: Self-pay

## 2021-05-18 DIAGNOSIS — Z79899 Other long term (current) drug therapy: Secondary | ICD-10-CM | POA: Insufficient documentation

## 2021-05-18 DIAGNOSIS — I129 Hypertensive chronic kidney disease with stage 1 through stage 4 chronic kidney disease, or unspecified chronic kidney disease: Secondary | ICD-10-CM | POA: Diagnosis not present

## 2021-05-18 DIAGNOSIS — M79674 Pain in right toe(s): Secondary | ICD-10-CM | POA: Diagnosis present

## 2021-05-18 DIAGNOSIS — F17210 Nicotine dependence, cigarettes, uncomplicated: Secondary | ICD-10-CM | POA: Diagnosis not present

## 2021-05-18 DIAGNOSIS — L84 Corns and callosities: Secondary | ICD-10-CM | POA: Insufficient documentation

## 2021-05-18 DIAGNOSIS — N183 Chronic kidney disease, stage 3 unspecified: Secondary | ICD-10-CM | POA: Diagnosis not present

## 2021-05-18 MED ORDER — CEPHALEXIN 500 MG PO CAPS: 500.0000 mg | ORAL_CAPSULE | Freq: Three times a day (TID) | ORAL | 0 refills | Status: AC

## 2021-05-18 MED ORDER — CEPHALEXIN 500 MG PO CAPS
500.0000 mg | ORAL_CAPSULE | Freq: Once | ORAL | Status: AC
  Administered 2021-05-18: 500 mg via ORAL
  Filled 2021-05-18: qty 1

## 2021-05-18 NOTE — ED Triage Notes (Signed)
First Nurse Note:  C/O right toe is purple x 1 week.

## 2021-05-18 NOTE — ED Triage Notes (Signed)
Pt states he has an ingrown toenail of the right 2nd toe for the past 3 days

## 2021-05-18 NOTE — Discharge Instructions (Signed)
Keep the callus covered as needed for pain-relief and protection. Take the antibiotic as directed. Follow-up with the [facility] Special[facility], as discussed.

## 2021-05-18 NOTE — ED Notes (Signed)
See triage note  Possible ingrown nail to right 2nd toe

## 2021-05-18 NOTE — ED Provider Notes (Addendum)
[facility] Emergency Department Provider Note  ____________________________________________   Event Date/Time   First MD Initiated Contact with Patient 05/18/21 1538     (approximate)  I have reviewed the triage vital signs and the nursing notes.   HISTORY  Chief Complaint Ingrown Toenail  HPI Gregory Hurley is a 75 y.o. male with below medical history, presents to the ED accompanied by his adult daughter, for evaluation of right toe pain.  Patient reports that he thinks an ingrown toenail on the right second toe for the last 3 days.  He presents with complaints of discomfort and a discolored toe, reported to be "purple" by family report.  He denies any other injury at this time.    Past Medical History:  Diagnosis Date  . Anemia   . CKD (chronic kidney disease), stage III ([place])   . Elevated alkaline phosphatase level   . History of fracture of clavicle   . History of fracture of rib   . Hypertension   . Pancytopenia ([place])   . PTSD (post-traumatic stress disorder)    FOLLOWED AT [facility]  . Thrombocytopenia [facility])     Patient Active Problem List   Diagnosis Date Noted  . PTSD (post-traumatic stress disorder) 06/25/2018  . Tetraplegia ([place]) 06/25/2018  . Labile blood pressure   . Drug induced constipation   . Acute blood loss anemia   . Central cord syndrome ([place]) 06/07/2018  . Syncope 07/07/2017  . HTN (hypertension) 07/07/2017  . Depression 07/07/2017  . CKD (chronic kidney disease), stage III ([place]) 07/07/2017  . Hyponatremia 07/07/2017  . Hypokalemia 07/07/2017    Past Surgical History:  Procedure Laterality Date  . NO PAST SURGERIES      Prior to Admission medications   Medication Sig Start Date End Date Taking? Authorizing Provider  cephALEXin (KEFLEX) 500 MG capsule Take 1 capsule (500 mg total) by mouth 3 (three) times daily for 7 days. 05/18/21 05/25/21 Yes Tamieka Rancourt, Dannielle Karvonen, PA-C  acetaminophen (TYLENOL) 325 MG tablet Take  1-2 tablets (325-650 mg total) by mouth every 4 (four) hours as needed for mild pain. 06/22/18   Love, Ivan Anchors, PA-C  albuterol (PROVENTIL HFA;VENTOLIN HFA) 108 (90 Base) MCG/ACT inhaler Inhale into the lungs every 6 (six) hours as needed for wheezing or shortness of breath.    [provider]  amLODipine (NORVASC) 10 MG tablet Take 1 tablet (10 mg total) by mouth daily. 06/22/18   Love, Ivan Anchors, PA-C  bisacodyl (DULCOLAX) 10 MG suppository Place 1 suppository (10 mg total) rectally daily at 6 (six) AM. 06/23/18   Love, Ivan Anchors, PA-C  carvedilol (COREG) 3.125 MG tablet Take 1 tablet (3.125 mg total) by mouth 2 (two) times daily with a meal. 06/22/18   Love, Ivan Anchors, PA-C  cloNIDine (CATAPRES) 0.2 MG tablet Take 1 tablet (0.2 mg total) by mouth 2 (two) times daily. 06/22/18   Love, Ivan Anchors, PA-C  gabapentin (NEURONTIN) 100 MG capsule Take 1 capsule (100 mg total) by mouth at bedtime. 06/22/18   Love, Ivan Anchors, PA-C  hydrALAZINE (APRESOLINE) 25 MG tablet Take 1 tablet (25 mg total) by mouth every 8 (eight) hours. 06/22/18   Love, Ivan Anchors, PA-C  latanoprost (XALATAN) 0.005 % ophthalmic solution Place 1 drop into both eyes at bedtime.    [provider]  lidocaine (LIDODERM) 5 % Apply to left shoulder at 7 am and remove at 7 pm daily.  PURCHASE THIS OVER THE COUNTER. 06/22/18  Love, Ivan Anchors, PA-C  Melatonin 3 MG TABS Take 2 tablets (6 mg total) by mouth at bedtime. IS OVER THE COUNTER. USED FOR SLEEP 06/22/18   Love, Ivan Anchors, PA-C  PARoxetine (PAXIL) 40 MG tablet Take 1 tablet (40 mg total) by mouth daily. 06/22/18   Love, Ivan Anchors, PA-C  polyethylene glycol (MIRALAX / GLYCOLAX) packet Take 17 g by mouth daily. 06/23/18   Love, Ivan Anchors, PA-C  sulfamethoxazole-trimethoprim (BACTRIM DS) 800-160 MG tablet Take 1 tablet by mouth 2 (two) times daily. 10/17/20   Sable Feil, PA-C  traZODone (DESYREL) 100 MG tablet Take 1 tablet (100 mg total) by mouth at bedtime. 06/22/18   Love, Ivan Anchors, PA-C   vitamin B-12 (CYANOCOBALAMIN) 1000 MCG tablet Take 1,000 mcg by mouth daily.     [provider]    Allergies Ace inhibitors  Family History  Problem Relation Age of Onset  . Breast cancer Mother   . Colon cancer Father   . Prostate cancer Father     Social History Social History   Tobacco Use  . Smoking status: Current Every Day Smoker    Packs/day: 1.00    Years: 50.00    Pack years: 50.00    Types: Cigars  . Smokeless tobacco: Never Used  . Tobacco comment: 50 PACK YR HX SMOKING SO HIGH RISK LUNG CANCER  Vaping Use  . Vaping Use: Never used  Substance Use Topics  . Alcohol use: Yes    Alcohol/week: 0.0 standard drinks    Comment: "reports no alcohol use in 5-7 days"  . Drug use: Yes    Comment: H/X OF CANNABIS ABUSE    Review of Systems  Constitutional: No fever/chills Eyes: No visual changes. ENT: No sore throat. Cardiovascular: Denies chest pain. Respiratory: Denies shortness of breath. Gastrointestinal: No abdominal pain.  No nausea, no vomiting.  No diarrhea.  No constipation. Genitourinary: Negative for dysuria. Musculoskeletal: Negative for back pain. Skin: Negative for rash. Right 2nd toe pain as above Neurological: Negative for headaches, focal weakness or numbness. ____________________________________________   PHYSICAL EXAM:  VITAL SIGNS: ED Triage Vitals [05/18/21 1442]  Enc Vitals Group     BP (!) 136/55     Pulse Rate (!) 47     Resp 16     Temp 97.8 F (36.6 C)     Temp Source Oral     SpO2 100 %     Weight 165 lb (74.8 kg)     Height 5\' 11"  (1.803 m)     Head Circumference      Peak Flow      Pain Score 8     Pain Loc      Pain Edu?      Excl. in [place]?     Constitutional: Alert and oriented. Well appearing and in no acute distress. Eyes: Conjunctivae are normal. PERRL. EOMI. Head: Atraumatic. Cardiovascular: Normal rate, regular rhythm. Grossly normal heart sounds.  Good peripheral circulation. Normal distal pulses  and cap refill.  Respiratory: Normal respiratory effort.   Musculoskeletal: No lower extremity tenderness nor edema.  No joint effusions.  Toe with a hypertrophic toenail consistent with onychomycosis.  He also has a hammertoe deformity, with a distal phalanx callus noted.  The surrounding skin is slightly hyperpigmented, but not erythematous, edematous, or purulent. No cyanosis, clubbing noted. Skin of toe is warm & dry.  No subungual hematoma or onycholysis is noted. Neurologic:  Normal speech and language. No gross focal neurologic deficits  are appreciated. No gait instability. Skin:  Skin is warm, dry and intact. No rash noted. Psychiatric: Mood and affect are normal. Speech and behavior are normal.  ____________________________________________   LABS (all labs ordered are listed, but only abnormal results are displayed)  Labs Reviewed - No data to display ____________________________________________  EKG   ____________________________________________  RADIOLOGY I, Melvenia Needles, personally viewed and evaluated these images (plain radiographs) as part of my medical decision making, as well as reviewing the written report by the radiologist.  ED MD interpretation:    Official radiology report(s): No results found.  ____________________________________________   PROCEDURES  Procedure(s) performed (including Critical Care):  Procedures  2 x 2 gauze applied to the distal right second toe for comfort. ____________________________________________   INITIAL IMPRESSION / ASSESSMENT AND PLAN / ED COURSE  As part of my medical decision making, I reviewed the following data within the [place] History obtained from family and Notes from prior ED visits   Geriatric patient ED evaluation of right second toe pain, presents with concern of a possible ingrown toenail.  Patient's evaluation reveals onychomycosis with a hammertoe deformity and distal callus  formation.  Patient is tender to palpation over the toe, and will be started empirically on a course of Keflex.  He is advised to follow-up with a primary provider through the [facility] medical system.  He should request a referral to podiatry for further evaluation and management.  Return precautions have been discussed.  Patient is discharged to the care of his adult daughter. ____________________________________________   FINAL CLINICAL IMPRESSION(S) / ED DIAGNOSES  Final diagnoses:  Corns and callus     ED Discharge Orders         Ordered    cephALEXin (KEFLEX) 500 MG capsule  3 times daily        05/18/21 1606           Note:  This document was prepared using Dragon voice recognition software and may include unintentional dictation errors.    Melvenia Needles, PA-C 05/18/21 1712    Melvenia Needles, PA-C 05/18/21 1812    Vanessa [place], MD 05/18/21 [phone]

## 2021-07-07 ENCOUNTER — Emergency Department
Admission: EM | Admit: 2021-07-07 | Discharge: 2021-07-07 | Disposition: A | Discharge status: Home / Self Care | Payer: No Typology Code available for payment source | Attending: Student in an Organized Health Care Education/Training Program | Admitting: Student in an Organized Health Care Education/Training Program

## 2021-07-07 ENCOUNTER — Emergency Department: Payer: No Typology Code available for payment source

## 2021-07-07 ENCOUNTER — Other Ambulatory Visit: Payer: Self-pay

## 2021-07-07 ENCOUNTER — Encounter: Payer: Self-pay | Admitting: Emergency Medicine

## 2021-07-07 DIAGNOSIS — M79675 Pain in left toe(s): Secondary | ICD-10-CM | POA: Insufficient documentation

## 2021-07-07 DIAGNOSIS — N183 Chronic kidney disease, stage 3 unspecified: Secondary | ICD-10-CM | POA: Diagnosis not present

## 2021-07-07 DIAGNOSIS — F17210 Nicotine dependence, cigarettes, uncomplicated: Secondary | ICD-10-CM | POA: Insufficient documentation

## 2021-07-07 DIAGNOSIS — I129 Hypertensive chronic kidney disease with stage 1 through stage 4 chronic kidney disease, or unspecified chronic kidney disease: Secondary | ICD-10-CM | POA: Diagnosis not present

## 2021-07-07 DIAGNOSIS — D631 Anemia in chronic kidney disease: Secondary | ICD-10-CM | POA: Diagnosis not present

## 2021-07-07 DIAGNOSIS — Z79899 Other long term (current) drug therapy: Secondary | ICD-10-CM | POA: Insufficient documentation

## 2021-07-07 DIAGNOSIS — M79674 Pain in right toe(s): Secondary | ICD-10-CM

## 2021-07-07 LAB — CBC WITH DIFFERENTIAL/PLATELET
Abs Immature Granulocytes: 0.01 10*3/uL (ref 0.00–0.07)
Basophils Absolute: 0 10*3/uL (ref 0.0–0.1)
Basophils Relative: 1 %
Eosinophils Absolute: 0.1 10*3/uL (ref 0.0–0.5)
Eosinophils Relative: 2 %
HCT: 34.1 % — ABNORMAL LOW (ref 39.0–52.0)
Hemoglobin: 11.3 g/dL — ABNORMAL LOW (ref 13.0–17.0)
Immature Granulocytes: 0 %
Lymphocytes Relative: 33 %
Lymphs Abs: 1.3 10*3/uL (ref 0.7–4.0)
MCH: 26.5 pg (ref 26.0–34.0)
MCHC: 33.1 g/dL (ref 30.0–36.0)
MCV: 79.9 fL — ABNORMAL LOW (ref 80.0–100.0)
Monocytes Absolute: 0.4 10*3/uL (ref 0.1–1.0)
Monocytes Relative: 10 %
Neutro Abs: 2.1 10*3/uL (ref 1.7–7.7)
Neutrophils Relative %: 54 %
Platelets: 143 10*3/uL — ABNORMAL LOW (ref 150–400)
RBC: 4.27 MIL/uL (ref 4.22–5.81)
RDW: 15 % (ref 11.5–15.5)
WBC: 3.8 10*3/uL — ABNORMAL LOW (ref 4.0–10.5)
nRBC: 0 % (ref 0.0–0.2)

## 2021-07-07 LAB — COMPREHENSIVE METABOLIC PANEL
ALT: 13 U/L (ref 0–44)
AST: 17 U/L (ref 15–41)
Albumin: 3.8 g/dL (ref 3.5–5.0)
Alkaline Phosphatase: 103 U/L (ref 38–126)
Anion gap: 6 (ref 5–15)
BUN: 21 mg/dL (ref 8–23)
CO2: 27 mmol/L (ref 22–32)
Calcium: 9 mg/dL (ref 8.9–10.3)
Chloride: 103 mmol/L (ref 98–111)
Creatinine, Ser: 1.34 mg/dL — ABNORMAL HIGH (ref 0.61–1.24)
GFR, Estimated: 56 mL/min — ABNORMAL LOW (ref >60)
Glucose, Bld: 99 mg/dL (ref 70–99)
Potassium: 4.1 mmol/L (ref 3.5–5.1)
Sodium: 136 mmol/L (ref 135–145)
Total Bilirubin: 0.7 mg/dL (ref 0.3–1.2)
Total Protein: 7.7 g/dL (ref 6.5–8.1)

## 2021-07-07 NOTE — ED Provider Notes (Signed)
[facility] Emergency Department Provider Note    Event Date/Time   First MD Initiated Contact with Patient 07/07/21 1339     (approximate)  I have reviewed the triage vital signs and the nursing notes.   HISTORY  Chief Complaint Toe Pain    HPI Gregory Hurley is a 75 y.o. male below listed past medical history presents to the ER for evaluation of 4 months of pain and discomfort to the left second toe.  Is initially seen in the [place] for this and has follow-up appointment for [name] came to the ER today due to persistent pain and concern for discoloration there is not noted any drainage.  No antibiotics.  No fevers.  No interval injury.  Past Medical History:  Diagnosis Date   Anemia    CKD (chronic kidney disease), stage III (HCC)    Elevated alkaline phosphatase level    History of fracture of clavicle    History of fracture of rib    Hypertension    Pancytopenia (HCC)    PTSD (post-traumatic stress disorder)    [facility]   Thrombocytopenia ([place])    Family History  Problem Relation Age of Onset   Breast cancer Mother    Colon cancer Father    Prostate cancer Father    Past Surgical History:  Procedure Laterality Date   NO PAST SURGERIES     Patient Active Problem List   Diagnosis Date Noted   PTSD (post-traumatic stress disorder) 06/25/2018   Tetraplegia ([place]) 06/25/2018   Labile blood pressure    Drug induced constipation    Acute blood loss anemia    Central cord syndrome (HCC) 06/07/2018   Syncope 07/07/2017   HTN (hypertension) 07/07/2017   Depression 07/07/2017   CKD (chronic kidney disease), stage III ([place]) 07/07/2017   Hyponatremia 07/07/2017   Hypokalemia 07/07/2017      Prior to Admission medications   Medication Sig Start Date End Date Taking? Authorizing Provider  acetaminophen (TYLENOL) 325 MG tablet Take 1-2 tablets (325-650 mg total) by mouth every 4 (four) hours as needed for mild pain. 06/22/18   Love,  Ivan Anchors, PA-C  albuterol (PROVENTIL HFA;VENTOLIN HFA) 108 (90 Base) MCG/ACT inhaler Inhale into the lungs every 6 (six) hours as needed for wheezing or shortness of breath.    [provider]  amLODipine (NORVASC) 10 MG tablet Take 1 tablet (10 mg total) by mouth daily. 06/22/18   Love, Ivan Anchors, PA-C  bisacodyl (DULCOLAX) 10 MG suppository Place 1 suppository (10 mg total) rectally daily at 6 (six) AM. 06/23/18   Love, Ivan Anchors, PA-C  carvedilol (COREG) 3.125 MG tablet Take 1 tablet (3.125 mg total) by mouth 2 (two) times daily with a meal. 06/22/18   Love, Ivan Anchors, PA-C  cloNIDine (CATAPRES) 0.2 MG tablet Take 1 tablet (0.2 mg total) by mouth 2 (two) times daily. 06/22/18   Love, Ivan Anchors, PA-C  gabapentin (NEURONTIN) 100 MG capsule Take 1 capsule (100 mg total) by mouth at bedtime. 06/22/18   Love, Ivan Anchors, PA-C  hydrALAZINE (APRESOLINE) 25 MG tablet Take 1 tablet (25 mg total) by mouth every 8 (eight) hours. 06/22/18   Love, Ivan Anchors, PA-C  latanoprost (XALATAN) 0.005 % ophthalmic solution Place 1 drop into both eyes at bedtime.    [provider]  lidocaine (LIDODERM) 5 % Apply to left shoulder at 7 am and remove at 7 pm daily.  PURCHASE THIS OVER THE COUNTER. 06/22/18  Love, Ivan Anchors, PA-C  Melatonin 3 MG TABS Take 2 tablets (6 mg total) by mouth at bedtime. IS OVER THE COUNTER. USED FOR SLEEP 06/22/18   Love, Ivan Anchors, PA-C  PARoxetine (PAXIL) 40 MG tablet Take 1 tablet (40 mg total) by mouth daily. 06/22/18   Love, Ivan Anchors, PA-C  polyethylene glycol (MIRALAX / GLYCOLAX) packet Take 17 g by mouth daily. 06/23/18   Love, Ivan Anchors, PA-C  sulfamethoxazole-trimethoprim (BACTRIM DS) 800-160 MG tablet Take 1 tablet by mouth 2 (two) times daily. 10/17/20   Sable Feil, PA-C  traZODone (DESYREL) 100 MG tablet Take 1 tablet (100 mg total) by mouth at bedtime. 06/22/18   Love, Ivan Anchors, PA-C  vitamin B-12 (CYANOCOBALAMIN) 1000 MCG tablet Take 1,000 mcg by mouth daily.     [provider]     Allergies Ace inhibitors    Social History Social History   Tobacco Use   Smoking status: Every Day    Packs/day: 1.00    Years: 50.00    Pack years: 50.00    Types: Cigars, Cigarettes   Smokeless tobacco: Never   Tobacco comments:    20 PACK YR HX SMOKING SO HIGH RISK LUNG CANCER  Vaping Use   Vaping Use: Never used  Substance Use Topics   Alcohol use: Yes    Alcohol/week: 0.0 standard drinks    Comment: "reports no alcohol use in 5-7 days"   Drug use: Yes    Comment: H/X OF CANNABIS ABUSE    Review of Systems Patient denies headaches, rhinorrhea, blurry vision, numbness, shortness of breath, chest pain, edema, cough, abdominal pain, nausea, vomiting, diarrhea, dysuria, fevers, rashes or hallucinations unless otherwise stated above in HPI. ____________________________________________   PHYSICAL EXAM:  VITAL SIGNS: Vitals:   07/07/21 1351 07/07/21 1456  BP: (!) 185/78 (!) 177/89  Pulse: (!) 55 60  Resp: 17 17  Temp:    SpO2: 98% 98%    Constitutional: Alert and oriented.  Eyes: Conjunctivae are normal.  Head: Atraumatic. Nose: No congestion/rhinnorhea. Mouth/Throat: Mucous membranes are moist.   Neck: No stridor. Painless ROM.  Cardiovascular: Normal rate, regular rhythm. Grossly normal heart sounds.  Good peripheral circulation. Respiratory: Normal respiratory effort.  No retractions. Lungs CTAB. Gastrointestinal: Soft and nontender. No distention. No abdominal bruits. No CVA tenderness. Genitourinary:  Musculoskeletal: No lower extremity tenderness nor edema.  Slight dusky discoloration of the distal aspect of the right second toe no purulence no overlying warmth.  DP and PT pulses palpable.  No joint effusions. Neurologic:  Normal speech and language. No gross focal neurologic deficits are appreciated. No facial droop Skin:  Skin is warm, dry and intact. No rash noted. Psychiatric: Mood and affect are normal. Speech and behavior are  normal.  ____________________________________________   LABS (all labs ordered are listed, but only abnormal results are displayed)  Results for orders placed or performed during the hospital encounter of 07/07/21 (from the past 24 hour(s))  CBC with Differential     Status: Abnormal   Collection Time: 07/07/21  1:40 PM  Result Value Ref Range   WBC 3.8 (L) 4.0 - 10.5 K/uL   RBC 4.27 4.22 - 5.81 MIL/uL   Hemoglobin 11.3 (L) 13.0 - 17.0 g/dL   HCT 34.1 (L) 39.0 - 52.0 %   MCV 79.9 (L) 80.0 - 100.0 fL   MCH 26.5 26.0 - 34.0 pg   MCHC 33.1 30.0 - 36.0 g/dL   RDW 15.0 11.5 - 15.5 %  Platelets 143 (L) 150 - 400 K/uL   nRBC 0.0 0.0 - 0.2 %   Neutrophils Relative % 54 %   Neutro Abs 2.1 1.7 - 7.7 K/uL   Lymphocytes Relative 33 %   Lymphs Abs 1.3 0.7 - 4.0 K/uL   Monocytes Relative 10 %   Monocytes Absolute 0.4 0.1 - 1.0 K/uL   Eosinophils Relative 2 %   Eosinophils Absolute 0.1 0.0 - 0.5 K/uL   Basophils Relative 1 %   Basophils Absolute 0.0 0.0 - 0.1 K/uL   Immature Granulocytes 0 %   Abs Immature Granulocytes 0.01 0.00 - 0.07 K/uL  Comprehensive metabolic panel     Status: Abnormal   Collection Time: 07/07/21  1:40 PM  Result Value Ref Range   Sodium 136 135 - 145 mmol/L   Potassium 4.1 3.5 - 5.1 mmol/L   Chloride 103 98 - 111 mmol/L   CO2 27 22 - 32 mmol/L   Glucose, Bld 99 70 - 99 mg/dL   BUN 21 8 - 23 mg/dL   Creatinine, Ser 1.34 (H) 0.61 - 1.24 mg/dL   Calcium 9.0 8.9 - 10.3 mg/dL   Total Protein 7.7 6.5 - 8.1 g/dL   Albumin 3.8 3.5 - 5.0 g/dL   AST 17 15 - 41 U/L   ALT 13 0 - 44 U/L   Alkaline Phosphatase 103 38 - 126 U/L   Total Bilirubin 0.7 0.3 - 1.2 mg/dL   GFR, Estimated 56 (L) >60 mL/min   Anion gap 6 5 - 15   ____________________________________________ _______________________________________  RADIOLOGY  I personally reviewed all radiographic images ordered to evaluate for the above acute complaints and reviewed radiology reports and findings.  These  findings were personally discussed with the patient.  Please see medical record for radiology report.  ____________________________________________   PROCEDURES  Procedure(s) performed:  Procedures    Critical Care performed: no ____________________________________________   INITIAL IMPRESSION / ASSESSMENT AND PLAN / ED COURSE  Pertinent labs & imaging results that were available during my care of the patient were reviewed by me and considered in my medical decision making (see chart for details).   DDX: Osteo-, fracture, cellulitis, gangrene  Gregory Hurley is a 75 y.o. who presents to the ED with presentation as described above.  Patient with chronic wound to right toe does not probe.  No evidence of overlying cellulitic changes afebrile no white count.  X-ray suggestive of osteomyelitis.  Discussed case in consultation with podiatry.  Given lack of infectious markers at this time will follow-up in clinic for further work-up evaluation possible imaging.  Patient agreeable plan.  Discussed signs and symptoms which she should return to the ER.     The patient was evaluated in Emergency Department today for the symptoms described in the history of present illness. He/she was evaluated in the context of the global COVID-19 pandemic, which necessitated consideration that the patient might be at risk for infection with the SARS-CoV-2 virus that causes COVID-19. Institutional protocols and algorithms that pertain to the evaluation of patients at risk for COVID-19 are in a state of rapid change based on information released by regulatory bodies including the CDC and federal and state organizations. These policies and algorithms were followed during the patient's care in the ED.  As part of my medical decision making, I reviewed the following data within the [place] notes reviewed and incorporated, Labs reviewed, notes from prior ED visits and [place] Controlled Substance  Database  ____________________________________________   FINAL CLINICAL IMPRESSION(S) / ED DIAGNOSES  Final diagnoses:  Toe pain, right      NEW MEDICATIONS STARTED DURING THIS VISIT:  New Prescriptions   No medications on file     Note:  This document was prepared using Dragon voice recognition software and may include unintentional dictation errors.    Merlyn Lot, MD 07/07/21 1501

## 2021-07-07 NOTE — Discharge Instructions (Addendum)
Your x-ray shows findings that are concerning for osteomyelitis and will need additional testing in clinic.  As we discussed you need to follow-up in podiatry clinic.  Please return if you start noticing any redness drainage worsening pain or fevers

## 2021-07-07 NOTE — ED Triage Notes (Signed)
Right toe pain.  Seen through [facility] for initial consultation re:  Toe pain.  Checked for infection.  Has follow up appointment set for 8/1.  Arrives today c/o right toe pain worsening, difficulty walking due to pain and home health aid told daughter that toe is turning blue.

## 2021-12-08 ENCOUNTER — Encounter: Payer: Self-pay | Admitting: Emergency Medicine

## 2021-12-08 ENCOUNTER — Inpatient Hospital Stay
Admission: EM | Admit: 2021-12-08 | Discharge: 2021-12-09 | DRG: 554 | Disposition: A | Discharge status: Home / Self Care | Payer: No Typology Code available for payment source | Attending: Family Medicine | Admitting: Family Medicine

## 2021-12-08 ENCOUNTER — Emergency Department: Payer: No Typology Code available for payment source

## 2021-12-08 ENCOUNTER — Other Ambulatory Visit: Payer: Self-pay

## 2021-12-08 ENCOUNTER — Inpatient Hospital Stay: Payer: No Typology Code available for payment source

## 2021-12-08 DIAGNOSIS — Z20822 Contact with and (suspected) exposure to covid-19: Secondary | ICD-10-CM | POA: Diagnosis present

## 2021-12-08 DIAGNOSIS — I10 Essential (primary) hypertension: Secondary | ICD-10-CM | POA: Diagnosis present

## 2021-12-08 DIAGNOSIS — I129 Hypertensive chronic kidney disease with stage 1 through stage 4 chronic kidney disease, or unspecified chronic kidney disease: Secondary | ICD-10-CM | POA: Diagnosis present

## 2021-12-08 DIAGNOSIS — M87051 Idiopathic aseptic necrosis of right femur: Secondary | ICD-10-CM | POA: Diagnosis not present

## 2021-12-08 DIAGNOSIS — E871 Hypo-osmolality and hyponatremia: Secondary | ICD-10-CM | POA: Diagnosis present

## 2021-12-08 DIAGNOSIS — Y92009 Unspecified place in unspecified non-institutional (private) residence as the place of occurrence of the external cause: Secondary | ICD-10-CM | POA: Diagnosis not present

## 2021-12-08 DIAGNOSIS — S2249XA Multiple fractures of ribs, unspecified side, initial encounter for closed fracture: Secondary | ICD-10-CM

## 2021-12-08 DIAGNOSIS — M545 Low back pain, unspecified: Secondary | ICD-10-CM | POA: Diagnosis present

## 2021-12-08 DIAGNOSIS — J449 Chronic obstructive pulmonary disease, unspecified: Secondary | ICD-10-CM | POA: Diagnosis present

## 2021-12-08 DIAGNOSIS — M87052 Idiopathic aseptic necrosis of left femur: Secondary | ICD-10-CM | POA: Diagnosis not present

## 2021-12-08 DIAGNOSIS — W19XXXA Unspecified fall, initial encounter: Secondary | ICD-10-CM | POA: Diagnosis not present

## 2021-12-08 DIAGNOSIS — Z79899 Other long term (current) drug therapy: Secondary | ICD-10-CM | POA: Diagnosis not present

## 2021-12-08 DIAGNOSIS — S2241XA Multiple fractures of ribs, right side, initial encounter for closed fracture: Secondary | ICD-10-CM | POA: Diagnosis present

## 2021-12-08 DIAGNOSIS — S2242XA Multiple fractures of ribs, left side, initial encounter for closed fracture: Secondary | ICD-10-CM | POA: Diagnosis not present

## 2021-12-08 DIAGNOSIS — I160 Hypertensive urgency: Secondary | ICD-10-CM | POA: Diagnosis present

## 2021-12-08 DIAGNOSIS — F17210 Nicotine dependence, cigarettes, uncomplicated: Secondary | ICD-10-CM | POA: Diagnosis present

## 2021-12-08 DIAGNOSIS — M87851 Other osteonecrosis, right femur: Secondary | ICD-10-CM | POA: Diagnosis present

## 2021-12-08 DIAGNOSIS — F17290 Nicotine dependence, other tobacco product, uncomplicated: Secondary | ICD-10-CM | POA: Diagnosis present

## 2021-12-08 DIAGNOSIS — G629 Polyneuropathy, unspecified: Secondary | ICD-10-CM | POA: Diagnosis present

## 2021-12-08 DIAGNOSIS — D631 Anemia in chronic kidney disease: Secondary | ICD-10-CM | POA: Diagnosis present

## 2021-12-08 DIAGNOSIS — N1831 Chronic kidney disease, stage 3a: Secondary | ICD-10-CM | POA: Diagnosis present

## 2021-12-08 DIAGNOSIS — W010XXA Fall on same level from slipping, tripping and stumbling without subsequent striking against object, initial encounter: Secondary | ICD-10-CM | POA: Diagnosis present

## 2021-12-08 DIAGNOSIS — Z888 Allergy status to other drugs, medicaments and biological substances status: Secondary | ICD-10-CM | POA: Diagnosis not present

## 2021-12-08 DIAGNOSIS — F4310 Post-traumatic stress disorder, unspecified: Secondary | ICD-10-CM | POA: Diagnosis present

## 2021-12-08 LAB — CBC WITH DIFFERENTIAL/PLATELET
Abs Immature Granulocytes: 0.02 10*3/uL (ref 0.00–0.07)
Basophils Absolute: 0 10*3/uL (ref 0.0–0.1)
Basophils Relative: 0 %
Eosinophils Absolute: 0.1 10*3/uL (ref 0.0–0.5)
Eosinophils Relative: 1 %
HCT: 34.7 % — ABNORMAL LOW (ref 39.0–52.0)
Hemoglobin: 11.5 g/dL — ABNORMAL LOW (ref 13.0–17.0)
Immature Granulocytes: 0 %
Lymphocytes Relative: 16 %
Lymphs Abs: 0.8 10*3/uL (ref 0.7–4.0)
MCH: 25.6 pg — ABNORMAL LOW (ref 26.0–34.0)
MCHC: 33.1 g/dL (ref 30.0–36.0)
MCV: 77.3 fL — ABNORMAL LOW (ref 80.0–100.0)
Monocytes Absolute: 0.4 10*3/uL (ref 0.1–1.0)
Monocytes Relative: 8 %
Neutro Abs: 3.9 10*3/uL (ref 1.7–7.7)
Neutrophils Relative %: 75 %
Platelets: 157 10*3/uL (ref 150–400)
RBC: 4.49 MIL/uL (ref 4.22–5.81)
RDW: 14.3 % (ref 11.5–15.5)
WBC: 5.2 10*3/uL (ref 4.0–10.5)
nRBC: 0 % (ref 0.0–0.2)

## 2021-12-08 LAB — BASIC METABOLIC PANEL
Anion gap: 7 (ref 5–15)
BUN: 17 mg/dL (ref 8–23)
CO2: 23 mmol/L (ref 22–32)
Calcium: 8.9 mg/dL (ref 8.9–10.3)
Chloride: 102 mmol/L (ref 98–111)
Creatinine, Ser: 1.3 mg/dL — ABNORMAL HIGH (ref 0.61–1.24)
GFR, Estimated: 58 mL/min — ABNORMAL LOW (ref >60)
Glucose, Bld: 90 mg/dL (ref 70–99)
Potassium: 4.5 mmol/L (ref 3.5–5.1)
Sodium: 132 mmol/L — ABNORMAL LOW (ref 135–145)

## 2021-12-08 LAB — URINALYSIS, MICROSCOPIC (REFLEX): Squamous Epithelial / HPF: NONE SEEN (ref 0–5)

## 2021-12-08 LAB — URINALYSIS, ROUTINE W REFLEX MICROSCOPIC
Bilirubin Urine: NEGATIVE
Glucose, UA: NEGATIVE mg/dL
Ketones, ur: NEGATIVE mg/dL
Nitrite: POSITIVE — AB
Protein, ur: 100 mg/dL — AB
Specific Gravity, Urine: 1.01 (ref 1.005–1.030)
pH: 5.5 (ref 5.0–8.0)

## 2021-12-08 LAB — RESP PANEL BY RT-PCR (FLU A&B, COVID) ARPGX2
Influenza A by PCR: NEGATIVE
Influenza B by PCR: NEGATIVE
SARS Coronavirus 2 by RT PCR: NEGATIVE

## 2021-12-08 MED ORDER — ONDANSETRON HCL 4 MG/2ML IJ SOLN: 4.0000 mg | Freq: Four times a day (QID) | INTRAMUSCULAR | Status: DC | PRN

## 2021-12-08 MED ORDER — ONDANSETRON HCL 4 MG PO TABS: 4.0000 mg | ORAL_TABLET | Freq: Four times a day (QID) | ORAL | Status: DC | PRN

## 2021-12-08 MED ORDER — VITAMIN B-12 1000 MCG PO TABS
1000.0000 ug | ORAL_TABLET | Freq: Every day | ORAL | Status: DC
  Administered 2021-12-08 – 2021-12-09 (×2): 1000 ug via ORAL
  Filled 2021-12-08 (×2): qty 1

## 2021-12-08 MED ORDER — ACETAMINOPHEN 325 MG PO TABS: 650.0000 mg | ORAL_TABLET | Freq: Four times a day (QID) | ORAL | Status: DC | PRN

## 2021-12-08 MED ORDER — ONDANSETRON HCL 4 MG/2ML IJ SOLN
4.0000 mg | Freq: Once | INTRAMUSCULAR | Status: AC
  Administered 2021-12-08: 12:00:00 4 mg via INTRAVENOUS
  Filled 2021-12-08: qty 2

## 2021-12-08 MED ORDER — LATANOPROST 0.005 % OP SOLN
1.0000 [drp] | Freq: Every day | OPHTHALMIC | Status: DC
  Administered 2021-12-08: 22:00:00 1 [drp] via OPHTHALMIC
  Filled 2021-12-08: qty 2.5

## 2021-12-08 MED ORDER — MELATONIN 5 MG PO TABS
5.0000 mg | ORAL_TABLET | Freq: Every day | ORAL | Status: DC
  Administered 2021-12-08: 21:00:00 5 mg via ORAL
  Filled 2021-12-08: qty 1

## 2021-12-08 MED ORDER — MORPHINE SULFATE (PF) 4 MG/ML IV SOLN
4.0000 mg | Freq: Once | INTRAVENOUS | Status: AC
  Administered 2021-12-08: 12:00:00 4 mg via INTRAVENOUS
  Filled 2021-12-08: qty 1

## 2021-12-08 MED ORDER — HYDROCODONE-ACETAMINOPHEN 5-325 MG PO TABS: 1.0000 | ORAL_TABLET | ORAL | Status: DC | PRN

## 2021-12-08 MED ORDER — PAROXETINE HCL 20 MG PO TABS
20.0000 mg | ORAL_TABLET | Freq: Every day | ORAL | Status: DC
  Administered 2021-12-08 – 2021-12-09 (×2): 20 mg via ORAL
  Filled 2021-12-08 (×2): qty 1

## 2021-12-08 MED ORDER — HYDRALAZINE HCL 50 MG PO TABS: 25.0000 mg | ORAL_TABLET | Freq: Three times a day (TID) | ORAL | Status: DC

## 2021-12-08 MED ORDER — HYDRALAZINE HCL 20 MG/ML IJ SOLN: 5.0000 mg | INTRAMUSCULAR | Status: DC | PRN

## 2021-12-08 MED ORDER — CLONIDINE HCL 0.1 MG PO TABS
0.2000 mg | ORAL_TABLET | Freq: Two times a day (BID) | ORAL | Status: DC
  Administered 2021-12-08 – 2021-12-09 (×3): 0.2 mg via ORAL
  Filled 2021-12-08 (×3): qty 2

## 2021-12-08 MED ORDER — HYDRALAZINE HCL 50 MG PO TABS
50.0000 mg | ORAL_TABLET | Freq: Three times a day (TID) | ORAL | Status: DC
  Administered 2021-12-08 – 2021-12-09 (×4): 50 mg via ORAL
  Filled 2021-12-08 (×4): qty 1

## 2021-12-08 MED ORDER — CARVEDILOL 3.125 MG PO TABS
3.1250 mg | ORAL_TABLET | Freq: Two times a day (BID) | ORAL | Status: DC
  Administered 2021-12-08 – 2021-12-09 (×2): 3.125 mg via ORAL
  Filled 2021-12-08 (×2): qty 1

## 2021-12-08 MED ORDER — ACETAMINOPHEN 650 MG RE SUPP: 650.0000 mg | Freq: Four times a day (QID) | RECTAL | Status: DC | PRN

## 2021-12-08 MED ORDER — AMLODIPINE BESYLATE 5 MG PO TABS: 5.0000 mg | ORAL_TABLET | Freq: Every day | ORAL | Status: DC

## 2021-12-08 MED ORDER — AMLODIPINE BESYLATE 5 MG PO TABS
10.0000 mg | ORAL_TABLET | Freq: Every day | ORAL | Status: DC
  Administered 2021-12-08 – 2021-12-09 (×2): 10 mg via ORAL
  Filled 2021-12-08 (×2): qty 2

## 2021-12-08 MED ORDER — LIDOCAINE 5 % EX PTCH
2.0000 | MEDICATED_PATCH | CUTANEOUS | Status: DC
  Administered 2021-12-08 – 2021-12-09 (×2): 2 via TRANSDERMAL
  Filled 2021-12-08 (×2): qty 2

## 2021-12-08 MED ORDER — GABAPENTIN 100 MG PO CAPS
100.0000 mg | ORAL_CAPSULE | Freq: Two times a day (BID) | ORAL | Status: DC
  Administered 2021-12-08 – 2021-12-09 (×3): 100 mg via ORAL
  Filled 2021-12-08 (×3): qty 1

## 2021-12-08 MED ORDER — MORPHINE SULFATE (PF) 2 MG/ML IV SOLN: 2.0000 mg | INTRAVENOUS | Status: DC | PRN

## 2021-12-08 NOTE — Consult Note (Addendum)
Patient seen, full consult to follow. Bilateral hip AVN, not noted previously.  Uses walker intermittently. Discussed with patient. Reason for Consult: Hip avascular necrosis Referring Physician: Dr. Ardelia Mems is an 75 y.o. male.  HPI:  Patient suffered a fall and suffered multiple rib fractures.  He has bilateral avascular necrosis on review of his x-rays.  On exam he had mild pain with hip rotation bilaterally.  He has been using a walker.  I told him that with the walker his rib pain is likely to increase.  His hip condition is likely to worsen with time and may ultimately require joint replacement but his pain is not bad enough right now.  He will follow-up with me in about a month and reassess hips at that time.  Past Medical History:  Diagnosis Date   Anemia    CKD (chronic kidney disease), stage III (HCC)    Elevated alkaline phosphatase level    History of fracture of clavicle    History of fracture of rib    Hypertension    Pancytopenia (HCC)    PTSD (post-traumatic stress disorder)    [facility]   Thrombocytopenia [facility])     Past Surgical History:  Procedure Laterality Date   NO PAST SURGERIES      Family History  Problem Relation Age of Onset   Breast cancer Mother    Colon cancer Father    Prostate cancer Father     Social History:  reports that he has been smoking cigars. He has a 50.00 pack-year smoking history. He has never used smokeless tobacco. He reports current alcohol use. He reports current drug use.  Allergies:  Allergies  Allergen Reactions   Ace Inhibitors     Other reaction(s): Angioedema    Medications: I have reviewed the patient's current medications.  Results for orders placed or performed during the hospital encounter of 12/08/21 (from the past 48 hour(s))  Urinalysis, Routine w reflex microscopic Urine, Random     Status: Abnormal   Collection Time: 12/08/21 11:25 AM  Result Value Ref Range   Color, Urine YELLOW  YELLOW   APPearance CLEAR CLEAR   Specific Gravity, Urine 1.010 1.005 - 1.030   pH 5.5 5.0 - 8.0   Glucose, UA NEGATIVE NEGATIVE mg/dL   Hgb urine dipstick SMALL (A) NEGATIVE   Bilirubin Urine NEGATIVE NEGATIVE   Ketones, ur NEGATIVE NEGATIVE mg/dL   Protein, ur 100 (A) NEGATIVE mg/dL   Nitrite POSITIVE (A) NEGATIVE   Leukocytes,Ua SMALL (A) NEGATIVE    Comment: Performed at [facility], [address] [place], [place] [address]  Urinalysis, Microscopic (reflex)     Status: Abnormal   Collection Time: 12/08/21 11:25 AM  Result Value Ref Range   RBC / HPF 0-5 0 - 5 RBC/hpf   WBC, UA 11-20 0 - 5 WBC/hpf   Bacteria, UA RARE (A) NONE SEEN   Squamous Epithelial / LPF NONE SEEN 0 - 5    Comment: Performed at [facility], [address] [place],  [address]  Basic metabolic panel     Status: Abnormal   Collection Time: 12/08/21 11:51 AM  Result Value Ref Range   Sodium 132 (L) 135 - 145 mmol/L   Potassium 4.5 3.5 - 5.1 mmol/L   Chloride 102 98 - 111 mmol/L   CO2 23 22 - 32 mmol/L   Glucose, Bld 90 70 - 99 mg/dL    Comment: Glucose reference range applies  only to samples taken after fasting for at least 8 hours.   BUN 17 8 - 23 mg/dL   Creatinine, Ser 1.30 (H) 0.61 - 1.24 mg/dL   Calcium 8.9 8.9 - 10.3 mg/dL   GFR, Estimated 58 (L) >60 mL/min    Comment: (NOTE) Calculated using the CKD-EPI Creatinine Equation (2021)    Anion gap 7 5 - 15    Comment: Performed at [facility], [address] [place], [place] [address]  CBC with Differential     Status: Abnormal   Collection Time: 12/08/21 11:51 AM  Result Value Ref Range   WBC 5.2 4.0 - 10.5 K/uL   RBC 4.49 4.22 - 5.81 MIL/uL   Hemoglobin 11.5 (L) 13.0 - 17.0 g/dL   HCT 34.7 (L) 39.0 - 52.0 %   MCV 77.3 (L) 80.0 - 100.0 fL   MCH 25.6 (L) 26.0 - 34.0 pg   MCHC 33.1 30.0 - 36.0 g/dL   RDW 14.3 11.5 - 15.5 %   Platelets 157 150 - 400 K/uL   nRBC 0.0 0.0 - 0.2 %   Neutrophils Relative %  75 %   Neutro Abs 3.9 1.7 - 7.7 K/uL   Lymphocytes Relative 16 %   Lymphs Abs 0.8 0.7 - 4.0 K/uL   Monocytes Relative 8 %   Monocytes Absolute 0.4 0.1 - 1.0 K/uL   Eosinophils Relative 1 %   Eosinophils Absolute 0.1 0.0 - 0.5 K/uL   Basophils Relative 0 %   Basophils Absolute 0.0 0.0 - 0.1 K/uL   Immature Granulocytes 0 %   Abs Immature Granulocytes 0.02 0.00 - 0.07 K/uL    Comment: Performed at [facility], [address] [place],  [address]  Resp Panel by RT-PCR (Flu A&B, Covid) Nasopharyngeal Swab     Status: None   Collection Time: 12/08/21 11:57 AM   Specimen: Nasopharyngeal Swab; Nasopharyngeal(NP) swabs in vial transport medium  Result Value Ref Range   SARS Coronavirus 2 by RT PCR NEGATIVE NEGATIVE    Comment: (NOTE) SARS-CoV-2 target nucleic acids are NOT DETECTED.  The SARS-CoV-2 RNA is generally detectable in upper respiratory specimens during the acute phase of infection. The lowest concentration of SARS-CoV-2 viral copies this assay can detect is 138 copies/mL. A negative result does not preclude SARS-Cov-2 infection and should not be used as the sole basis for treatment or other patient management decisions. A negative result may occur with  improper specimen collection/handling, submission of specimen other than nasopharyngeal swab, presence of viral mutation(s) within the areas targeted by this assay, and inadequate number of viral copies(<138 copies/mL). A negative result must be combined with clinical observations, patient history, and epidemiological information. The expected result is Negative.  Fact Sheet for Patients:  EntrepreneurPulse.com.au  Fact Sheet for Healthcare Providers:  IncredibleEmployment.be  This test is no t yet approved or cleared by the [place] FDA and  has been authorized for detection and/or diagnosis of SARS-CoV-2 by FDA under an Emergency Use Authorization (EUA). This EUA  will remain  in effect (meaning this test can be used) for the duration of the COVID-19 declaration under Section 564(b)(1) of the Act, 21 U.S.C.section 360bbb-3(b)(1), unless the authorization is terminated  or revoked sooner.       Influenza A by PCR NEGATIVE NEGATIVE   Influenza B by PCR NEGATIVE NEGATIVE    Comment: (NOTE) The Xpert Xpress SARS-CoV-2/FLU/RSV plus assay is intended as an aid in the diagnosis of influenza from Nasopharyngeal swab specimens  and should not be used as a sole basis for treatment. Nasal washings and aspirates are unacceptable for Xpert Xpress SARS-CoV-2/FLU/RSV testing.  Fact Sheet for Patients: EntrepreneurPulse.com.au  Fact Sheet for Healthcare Providers: IncredibleEmployment.be  This test is not yet approved or cleared by the [place] FDA and has been authorized for detection and/or diagnosis of SARS-CoV-2 by FDA under an Emergency Use Authorization (EUA). This EUA will remain in effect (meaning this test can be used) for the duration of the COVID-19 declaration under Section 564(b)(1) of the Act, 21 U.S.C. section 360bbb-3(b)(1), unless the authorization is terminated or revoked.  Performed at [facility], [address] [place], [place] [address]     DG Ribs Unilateral W/Chest Right  Result Date: 12/08/2021 CLINICAL DATA:  [place] last night with right-sided rib pain EXAM: RIGHT RIBS AND CHEST - 3+ VIEW COMPARISON:  05/01/2020 FINDINGS: Nondisplaced fractures of the right lateral sixth through tenth ribs. No pneumothorax or hemothorax. Patchy density in the right lung base that could be atelectasis or right base pneumonia. Follow-up to clearing suggested. Old healed rib fractures present on the left. IMPRESSION: Fractures of the right lateral sixth through tenth ribs. No pneumothorax or hemothorax. Patchy density at the right lung base could be atelectasis or pneumonia. Follow-up to clearing  recommended. Old healed rib fractures on the left. Electronically Signed   By: Nelson Chimes M.D.   On: 12/08/2021 10:53   DG Lumbar Spine 2-3 Views  Result Date: 12/08/2021 CLINICAL DATA:  Fall EXAM: LUMBAR SPINE - 2-3 VIEW COMPARISON:  CT abdomen/pelvis dated 06/30/2020. FINDINGS: Five lumbar type vertebral bodies. Normal lumbar lordosis. Moderate multilevel degenerative changes. No evidence of fracture or dislocation prevertebral body heights are maintained. Visualized bony pelvis appears intact. IMPRESSION: No fracture or dislocation is seen. Moderate multilevel degenerative changes. Electronically Signed   By: Julian Hy M.D.   On: 12/08/2021 15:04   CT HEAD WO CONTRAST (5MM)  Result Date: 12/08/2021 CLINICAL DATA:  Fall last night EXAM: CT HEAD WITHOUT CONTRAST CT CERVICAL SPINE WITHOUT CONTRAST TECHNIQUE: Multidetector CT imaging of the head and cervical spine was performed following the standard protocol without intravenous contrast. Multiplanar CT image reconstructions of the cervical spine were also generated. COMPARISON:  None. FINDINGS: CT HEAD FINDINGS Brain: No evidence of acute infarction, hemorrhage, hydrocephalus, extra-axial collection or mass lesion/mass effect. Periventricular and deep white matter hypodensity. Vascular: No hyperdense vessel or unexpected calcification. Skull: Normal. Negative for fracture or focal lesion. Sinuses/Orbits: No acute finding. Other: Right eccentric subgaleal lipoma about the skull vertex (series 4, image 37). CT CERVICAL SPINE FINDINGS Alignment: Normal. Skull base and vertebrae: No acute fracture. No primary bone lesion or focal pathologic process. Soft tissues and spinal canal: No prevertebral fluid or swelling. No visible canal hematoma. Disc levels: Severe DISH of the cervical spine, with bony ankylosis of all included cervical and thoracic levels below C2 (series 6, image 37). There is posterior laminectomy and fusion C2 through C7. Upper chest:  Moderate centrilobular emphysema. Other: Lipoma of the left neck underlying the platysma (series 3, image 71). IMPRESSION: 1. No acute intracranial pathology. Small-vessel white matter disease. 2. No fracture or static subluxation of the cervical spine. 3. Severe DISH of the cervical spine, with bony ankylosis of all included cervical and thoracic levels below C2. There is posterior laminectomy and fusion C2 through C7. 4. Emphysema. Emphysema (ICD10-J43.9). Electronically Signed   By: Delanna Ahmadi M.D.   On: 12/08/2021 12:31   CT Cervical Spine Wo  Contrast  Result Date: 12/08/2021 CLINICAL DATA:  Fall last night EXAM: CT HEAD WITHOUT CONTRAST CT CERVICAL SPINE WITHOUT CONTRAST TECHNIQUE: Multidetector CT imaging of the head and cervical spine was performed following the standard protocol without intravenous contrast. Multiplanar CT image reconstructions of the cervical spine were also generated. COMPARISON:  None. FINDINGS: CT HEAD FINDINGS Brain: No evidence of acute infarction, hemorrhage, hydrocephalus, extra-axial collection or mass lesion/mass effect. Periventricular and deep white matter hypodensity. Vascular: No hyperdense vessel or unexpected calcification. Skull: Normal. Negative for fracture or focal lesion. Sinuses/Orbits: No acute finding. Other: Right eccentric subgaleal lipoma about the skull vertex (series 4, image 37). CT CERVICAL SPINE FINDINGS Alignment: Normal. Skull base and vertebrae: No acute fracture. No primary bone lesion or focal pathologic process. Soft tissues and spinal canal: No prevertebral fluid or swelling. No visible canal hematoma. Disc levels: Severe DISH of the cervical spine, with bony ankylosis of all included cervical and thoracic levels below C2 (series 6, image 37). There is posterior laminectomy and fusion C2 through C7. Upper chest: Moderate centrilobular emphysema. Other: Lipoma of the left neck underlying the platysma (series 3, image 71). IMPRESSION: 1. No acute  intracranial pathology. Small-vessel white matter disease. 2. No fracture or static subluxation of the cervical spine. 3. Severe DISH of the cervical spine, with bony ankylosis of all included cervical and thoracic levels below C2. There is posterior laminectomy and fusion C2 through C7. 4. Emphysema. Emphysema (ICD10-J43.9). Electronically Signed   By: Delanna Ahmadi M.D.   On: 12/08/2021 12:31   DG HIP UNILAT WITH PELVIS 2-3 VIEWS LEFT  Result Date: 12/08/2021 CLINICAL DATA:  Fall EXAM: DG HIP (WITH OR WITHOUT PELVIS) 2-3V LEFT COMPARISON:  None. FINDINGS: There is no evidence of hip fracture or dislocation. Mild-to-moderate degenerative changes of the bilateral hips. Soft tissues are unremarkable. IMPRESSION: No acute osseous abnormality. Electronically Signed   By: Yetta Glassman M.D.   On: 12/08/2021 10:51   DG HIP UNILAT WITH PELVIS 2-3 VIEWS RIGHT  Result Date: 12/08/2021 CLINICAL DATA:  Bilateral hip pain EXAM: DG HIP (WITH OR WITHOUT PELVIS) 2-3V RIGHT COMPARISON:  CT abdomen and pelvis 06/30/2020 FINDINGS: No acute fracture or dislocation identified in the right hip. Sclerotic changes at the superior femoral head consistent with avascular necrosis. Avascular necrosis also noted in the left femoral head. Mild narrowing of the hip joint space with subchondral acetabular sclerosis and inferior marginal osteophytes. No acute pelvic fracture visualized. IMPRESSION: 1. No acute fracture or dislocation identified in the right hip. 2. Avascular necrosis of the femoral head. Electronically Signed   By: Ofilia Neas M.D.   On: 12/08/2021 10:52    Review of Systems Blood pressure (!) 145/61, pulse (!) 53, temperature 98.6 F (37 C), temperature source Oral, resp. rate 18, height 5\' 11"  (1.803 m), weight 68 kg, SpO2 98 %. Physical Exam As previously noted pain with rotation of the hips but not severe.  He is able flex extend the toes sensation and pulses intact.  No scars about the hip skin is  intact. Assessment/Plan: Hip avascular necrosis currently mildly symptomatic recommend follow-up in 1 month.  He will be weight-bear as tolerated here in the hospital and recommend use of a walker.  Gregory Hurley 12/09/2021, 7:11 AM

## 2021-12-08 NOTE — ED Notes (Signed)
Patient was assisted to stand to urinate. Patient was unable to void at this time. Patient was assisted back on the stretcher, given the call light with instructions for use and urinal is within reach.

## 2021-12-08 NOTE — ED Provider Notes (Signed)
[facility] Emergency Department Provider Note  ___________________________________________   Event Date/Time   First MD Initiated Contact with Patient 12/08/21 1106     (approximate)  I have reviewed the triage vital signs and the nursing notes.   HISTORY  Chief Complaint Fall  HPI Gregory Hurley is a 75 y.o. male with the below medical history, presents to the ED for evaluation of injury sustained following an a mechanical fall.  Patient describes last night at about 11 PM, he got up too quickly, and apparently fell.  He denies any head injury or LOC, but does endorse some pain to the right side of the ribs as well as the bilateral hip pain.  Apparently landed on his chair, causing acute right-sided rib pain.  Patient denies any chest pain, shortness of breath, or hemoptysis.  Patient lives independently but notes his brother lives in the same apartment building.  He has some mobility issues, and utilizes a walker and a cane to ambulate as needed.  Past Medical History:  Diagnosis Date   Anemia    CKD (chronic kidney disease), stage III (HCC)    Elevated alkaline phosphatase level    History of fracture of clavicle    History of fracture of rib    Hypertension    Pancytopenia (HCC)    PTSD (post-traumatic stress disorder)    [facility]   Thrombocytopenia [facility])     Patient Active Problem List   Diagnosis Date Noted   Avascular necrosis of bone of hip, right ([place]) 12/08/2021   Rib fractures 12/08/2021   PTSD (post-traumatic stress disorder) 06/25/2018   Tetraplegia ([place]) 06/25/2018   Labile blood pressure    Drug induced constipation    Acute blood loss anemia    Central cord syndrome (HCC) 06/07/2018   Syncope 07/07/2017   HTN (hypertension) 07/07/2017   Depression 07/07/2017   CKD (chronic kidney disease), stage III ([place]) 07/07/2017   Hyponatremia 07/07/2017   Hypokalemia 07/07/2017    Past Surgical History:  Procedure  Laterality Date   NO PAST SURGERIES      Prior to Admission medications   Medication Sig Start Date End Date Taking? Authorizing Provider  acetaminophen (TYLENOL) 325 MG tablet Take 1-2 tablets (325-650 mg total) by mouth every 4 (four) hours as needed for mild pain. 06/22/18   Love, Ivan Anchors, PA-C  albuterol (PROVENTIL HFA;VENTOLIN HFA) 108 (90 Base) MCG/ACT inhaler Inhale into the lungs every 6 (six) hours as needed for wheezing or shortness of breath.    [provider]  amLODipine (NORVASC) 10 MG tablet Take 1 tablet (10 mg total) by mouth daily. 06/22/18   Love, Ivan Anchors, PA-C  bisacodyl (DULCOLAX) 10 MG suppository Place 1 suppository (10 mg total) rectally daily at 6 (six) AM. 06/23/18   Love, Ivan Anchors, PA-C  carvedilol (COREG) 3.125 MG tablet Take 1 tablet (3.125 mg total) by mouth 2 (two) times daily with a meal. 06/22/18   Love, Ivan Anchors, PA-C  cloNIDine (CATAPRES) 0.2 MG tablet Take 1 tablet (0.2 mg total) by mouth 2 (two) times daily. 06/22/18   Love, Ivan Anchors, PA-C  gabapentin (NEURONTIN) 100 MG capsule Take 1 capsule (100 mg total) by mouth at bedtime. 06/22/18   Love, Ivan Anchors, PA-C  hydrALAZINE (APRESOLINE) 25 MG tablet Take 1 tablet (25 mg total) by mouth every 8 (eight) hours. 06/22/18   Love, Ivan Anchors, PA-C  latanoprost (XALATAN) 0.005 % ophthalmic solution Place 1 drop into both  eyes at bedtime.    [provider]  lidocaine (LIDODERM) 5 % Apply to left shoulder at 7 am and remove at 7 pm daily.  PURCHASE THIS OVER THE COUNTER. 06/22/18   Love, Ivan Anchors, PA-C  Melatonin 3 MG TABS Take 2 tablets (6 mg total) by mouth at bedtime. IS OVER THE COUNTER. USED FOR SLEEP 06/22/18   Love, Ivan Anchors, PA-C  PARoxetine (PAXIL) 40 MG tablet Take 1 tablet (40 mg total) by mouth daily. 06/22/18   Love, Ivan Anchors, PA-C  polyethylene glycol (MIRALAX / GLYCOLAX) packet Take 17 g by mouth daily. 06/23/18   Love, Ivan Anchors, PA-C  sulfamethoxazole-trimethoprim (BACTRIM DS) 800-160 MG tablet Take 1 tablet  by mouth 2 (two) times daily. 10/17/20   Sable Feil, PA-C  traZODone (DESYREL) 100 MG tablet Take 1 tablet (100 mg total) by mouth at bedtime. 06/22/18   Love, Ivan Anchors, PA-C  vitamin B-12 (CYANOCOBALAMIN) 1000 MCG tablet Take 1,000 mcg by mouth daily.     [provider]    Allergies Ace inhibitors  Family History  Problem Relation Age of Onset   Breast cancer Mother    Colon cancer Father    Prostate cancer Father     Social History Social History   Tobacco Use   Smoking status: Every Day    Packs/day: 1.00    Years: 50.00    Pack years: 50.00    Types: Cigars, Cigarettes   Smokeless tobacco: Never   Tobacco comments:    36 PACK YR HX SMOKING SO HIGH RISK LUNG CANCER  Vaping Use   Vaping Use: Never used  Substance Use Topics   Alcohol use: Yes    Alcohol/week: 0.0 standard drinks    Comment: "reports no alcohol use in 5-7 days"   Drug use: Yes    Comment: H/X OF CANNABIS ABUSE    Review of Systems  Constitutional: No fever/chills Eyes: No visual changes. ENT: No sore throat. Cardiovascular: Denies chest pain. Respiratory: Denies shortness of breath.  Reports right rib pain Gastrointestinal: No abdominal pain.  No nausea, no vomiting.  No diarrhea.  No constipation. Genitourinary: Negative for dysuria. Musculoskeletal: Negative for back pain.  Reports bilateral hip pain. Skin: Negative for rash. Neurological: Negative for headaches, focal weakness or numbness. ____________________________________________   PHYSICAL EXAM:  VITAL SIGNS: ED Triage Vitals  Enc Vitals Group     BP 12/08/21 1005 (!) 200/86     Pulse Rate 12/08/21 1005 71     Resp 12/08/21 1005 16     Temp 12/08/21 1005 98.7 F (37.1 C)     Temp Source 12/08/21 1005 Oral     SpO2 12/08/21 1005 97 %     Weight 12/08/21 1006 150 lb (68 kg)     Height 12/08/21 1006 5\' 11"  (1.803 m)     Head Circumference --      Peak Flow --      Pain Score 12/08/21 1005 5     Pain Loc --       Pain Edu? --      Excl. in [place]? --     Constitutional: Alert and oriented. Well appearing and in no acute distress. Eyes: Conjunctivae are normal. PERRL. EOMI. Head: Atraumatic. Nose: No congestion/rhinnorhea. Mouth/Throat: Mucous membranes are moist.  Oropharynx non-erythematous. Neck: No stridor.  No cervical spine tenderness to palpation. Cardiovascular: Normal rate, regular rhythm. Grossly normal heart sounds.  Good peripheral circulation. Respiratory: Normal respiratory effort.  No retractions.  Lungs CTAB. Tender to palp along the lateral right chest wall. No chest wall deformity or dyskinetic motion.  Gastrointestinal: Soft and nontender. No distention. No abdominal bruits. No CVA tenderness. Musculoskeletal: No lower extremity tenderness nor edema.  No joint effusions. Neurologic:  Normal speech and language. No gross focal neurologic deficits are appreciated. No gait instability. Skin:  Skin is warm, dry and intact. No rash noted. Psychiatric: Mood and affect are normal. Speech and behavior are normal.  ____________________________________________   LABS (all labs ordered are listed, but only abnormal results are displayed)  Labs Reviewed  BASIC METABOLIC PANEL - Abnormal; Notable for the following components:      Result Value   Sodium 132 (*)    Creatinine, Ser 1.30 (*)    GFR, Estimated 58 (*)    All other components within normal limits  CBC WITH DIFFERENTIAL/PLATELET - Abnormal; Notable for the following components:   Hemoglobin 11.5 (*)    HCT 34.7 (*)    MCV 77.3 (*)    MCH 25.6 (*)    All other components within normal limits  RESP PANEL BY RT-PCR (FLU A&B, COVID) ARPGX2  URINALYSIS, ROUTINE W REFLEX MICROSCOPIC   ____________________________________________  EKG   ____________________________________________  RADIOLOGY I, Melvenia Needles, personally viewed and evaluated these images (plain radiographs) as part of my medical decision making, as  well as reviewing the written report by the radiologist.  ED MD interpretation:  agree with reports  Official radiology report(s): DG Ribs Unilateral W/Chest Right  Result Date: 12/08/2021 CLINICAL DATA:  [place] last night with right-sided rib pain EXAM: RIGHT RIBS AND CHEST - 3+ VIEW COMPARISON:  05/01/2020 FINDINGS: Nondisplaced fractures of the right lateral sixth through tenth ribs. No pneumothorax or hemothorax. Patchy density in the right lung base that could be atelectasis or right base pneumonia. Follow-up to clearing suggested. Old healed rib fractures present on the left. IMPRESSION: Fractures of the right lateral sixth through tenth ribs. No pneumothorax or hemothorax. Patchy density at the right lung base could be atelectasis or pneumonia. Follow-up to clearing recommended. Old healed rib fractures on the left. Electronically Signed   By: Nelson Chimes M.D.   On: 12/08/2021 10:53   CT HEAD WO CONTRAST (5MM)  Result Date: 12/08/2021 CLINICAL DATA:  Fall last night EXAM: CT HEAD WITHOUT CONTRAST CT CERVICAL SPINE WITHOUT CONTRAST TECHNIQUE: Multidetector CT imaging of the head and cervical spine was performed following the standard protocol without intravenous contrast. Multiplanar CT image reconstructions of the cervical spine were also generated. COMPARISON:  None. FINDINGS: CT HEAD FINDINGS Brain: No evidence of acute infarction, hemorrhage, hydrocephalus, extra-axial collection or mass lesion/mass effect. Periventricular and deep white matter hypodensity. Vascular: No hyperdense vessel or unexpected calcification. Skull: Normal. Negative for fracture or focal lesion. Sinuses/Orbits: No acute finding. Other: Right eccentric subgaleal lipoma about the skull vertex (series 4, image 37). CT CERVICAL SPINE FINDINGS Alignment: Normal. Skull base and vertebrae: No acute fracture. No primary bone lesion or focal pathologic process. Soft tissues and spinal canal: No prevertebral fluid or swelling. No  visible canal hematoma. Disc levels: Severe DISH of the cervical spine, with bony ankylosis of all included cervical and thoracic levels below C2 (series 6, image 37). There is posterior laminectomy and fusion C2 through C7. Upper chest: Moderate centrilobular emphysema. Other: Lipoma of the left neck underlying the platysma (series 3, image 71). IMPRESSION: 1. No acute intracranial pathology. Small-vessel white matter disease. 2. No fracture or static subluxation of the cervical  spine. 3. Severe DISH of the cervical spine, with bony ankylosis of all included cervical and thoracic levels below C2. There is posterior laminectomy and fusion C2 through C7. 4. Emphysema. Emphysema (ICD10-J43.9). Electronically Signed   By: Delanna Ahmadi M.D.   On: 12/08/2021 12:31   CT Cervical Spine Wo Contrast  Result Date: 12/08/2021 CLINICAL DATA:  Fall last night EXAM: CT HEAD WITHOUT CONTRAST CT CERVICAL SPINE WITHOUT CONTRAST TECHNIQUE: Multidetector CT imaging of the head and cervical spine was performed following the standard protocol without intravenous contrast. Multiplanar CT image reconstructions of the cervical spine were also generated. COMPARISON:  None. FINDINGS: CT HEAD FINDINGS Brain: No evidence of acute infarction, hemorrhage, hydrocephalus, extra-axial collection or mass lesion/mass effect. Periventricular and deep white matter hypodensity. Vascular: No hyperdense vessel or unexpected calcification. Skull: Normal. Negative for fracture or focal lesion. Sinuses/Orbits: No acute finding. Other: Right eccentric subgaleal lipoma about the skull vertex (series 4, image 37). CT CERVICAL SPINE FINDINGS Alignment: Normal. Skull base and vertebrae: No acute fracture. No primary bone lesion or focal pathologic process. Soft tissues and spinal canal: No prevertebral fluid or swelling. No visible canal hematoma. Disc levels: Severe DISH of the cervical spine, with bony ankylosis of all included cervical and thoracic  levels below C2 (series 6, image 37). There is posterior laminectomy and fusion C2 through C7. Upper chest: Moderate centrilobular emphysema. Other: Lipoma of the left neck underlying the platysma (series 3, image 71). IMPRESSION: 1. No acute intracranial pathology. Small-vessel white matter disease. 2. No fracture or static subluxation of the cervical spine. 3. Severe DISH of the cervical spine, with bony ankylosis of all included cervical and thoracic levels below C2. There is posterior laminectomy and fusion C2 through C7. 4. Emphysema. Emphysema (ICD10-J43.9). Electronically Signed   By: Delanna Ahmadi M.D.   On: 12/08/2021 12:31   DG HIP UNILAT WITH PELVIS 2-3 VIEWS LEFT  Result Date: 12/08/2021 CLINICAL DATA:  Fall EXAM: DG HIP (WITH OR WITHOUT PELVIS) 2-3V LEFT COMPARISON:  None. FINDINGS: There is no evidence of hip fracture or dislocation. Mild-to-moderate degenerative changes of the bilateral hips. Soft tissues are unremarkable. IMPRESSION: No acute osseous abnormality. Electronically Signed   By: Yetta Glassman M.D.   On: 12/08/2021 10:51   DG HIP UNILAT WITH PELVIS 2-3 VIEWS RIGHT  Result Date: 12/08/2021 CLINICAL DATA:  Bilateral hip pain EXAM: DG HIP (WITH OR WITHOUT PELVIS) 2-3V RIGHT COMPARISON:  CT abdomen and pelvis 06/30/2020 FINDINGS: No acute fracture or dislocation identified in the right hip. Sclerotic changes at the superior femoral head consistent with avascular necrosis. Avascular necrosis also noted in the left femoral head. Mild narrowing of the hip joint space with subchondral acetabular sclerosis and inferior marginal osteophytes. No acute pelvic fracture visualized. IMPRESSION: 1. No acute fracture or dislocation identified in the right hip. 2. Avascular necrosis of the femoral head. Electronically Signed   By: Ofilia Neas M.D.   On: 12/08/2021 10:52    ____________________________________________   PROCEDURES  Procedure(s) performed (including Critical  Care):  Procedures  Morphine 4 mg IVP Zofran 4 mg IVP  ____________________________________________   INITIAL IMPRESSION / ASSESSMENT AND PLAN / ED COURSE  As part of my medical decision making, I reviewed the following data within the [place] reviewed stable, Radiograph reviewed as noted, Discussed with admitting physician Dr. Chauncey Cruel. Rowe Pavy, A consult was requested and obtained from this/these consultant(s) Orthopedics, and Notes from prior ED visits  Geriatric patient with ED evaluation of  injury sustained following mechanical fall.  Patient presents to the ED after a fall last night onto his chair.  He presents with acute pain to the right rib cage as well as bilateral hip pain.  Patient was evaluated for his complaints in the ED, found to have a reassuring exam.  Patient with some mobility issues at baseline, is stable at this time.  No CT evidence of any acute intracranial process or acute cervical spine fracture/dislocation.  No acute hip fracture or dislocation appreciated.  Patient does have evidence of AVN on the right hip.  Patient care and case was discussed with him and he verbalized understanding of the concern for deterioration given multiple rib fractures on the same side, his mobility issues at baseline, and his social situation.  Patient is agreeable to admission to hospital service for pain management.  Hospitalist was consulted, and will bring the patient in for pain management.  Ortho is also been consulted in his case given the patient's finding of AVN.    ----------------------------------------- 1:43 PM on 12/08/2021 ----------------------------------------- Dr. Rudene Christians will evaluate the patient in the ED and set him up for outpatient management.  Ultimately the patient will acquire a total hip arthroplasty on the affected side.  Clinical Course as of 12/08/21 1345  Wed Dec 08, 2021  1329 DG HIP UNILAT WITH PELVIS 2-3 VIEWS RIGHT [JM]    Clinical  Course User Index [JM] Elenie Coven, Dannielle Karvonen, PA-C     ____________________________________________   FINAL CLINICAL IMPRESSION(S) / ED DIAGNOSES  Final diagnoses:  Fall in home, initial encounter  Multiple rib fractures involving four or more ribs  Avascular necrosis of bone of hip, right [facility])     ED Discharge Orders     None        Note:  This document was prepared using Dragon voice recognition software and may include unintentional dictation errors.    Melvenia Needles, PA-C 12/08/21 1345    Nance Pear, MD 12/08/21 [phone]

## 2021-12-08 NOTE — ED Triage Notes (Signed)
Pt to ED via POV, pt states that he fell last night around 2300. Pt states that he got up too quick and then he fell. Pt states that he his having rib pain on the right side and bilateral hip pain. Pt denies hitting his head. Pt is in NAD.

## 2021-12-08 NOTE — ED Notes (Signed)
Pt unable to sign name on MSE waiver form due to previous neck injury and nerve damage. Pt was able to make X on waiver form

## 2021-12-08 NOTE — H&P (Addendum)
H&P:    Gregory Hurley   QQV:[id number] DOB: 28-Jan-1946 DOA: 12/08/2021  PCP: [facility]  Chief Complaint: Fall   History of Present Illness:    HPI: Gregory Hurley is a 75 y.o. male with a past medical history of hypertension, PTSD, chronic kidney disease stage IIIA, chronic anemia, COPD, tobacco use disorder (currently smokes about 1/4 ppd), ambulatory dysfunction.  This patient presents to the emergency department after having a mechanical fall at home.  States last night around 11 PM he got up too fast from his couch and he lost his balance and fell on the ground.  Was down for approximately 30 minutes before his brother helped him back up.  Denies hitting his head.  Denies any loss of consciousness.  Denies any preceding near-syncope, syncope.  Initial pain was 10 out of 10 in the emergency department.  Currently pain is 5 out of 10 after receiving morphine in the emergency department.  Uses a cane and Rollator to assist with ambulation at baseline.  Endorses right rib pain currently and low back pain.  No fevers or chills.  Denies any urinary complaints.  Denies any chest pain.  Denies any shortness of breath.  No abdominal pain, nausea or vomiting.  States he checks his blood pressure at home 2 times a week and it still runs high.  Average systolic at home is in the low 200s.  Average diastolic in the 33I.  ED Course: Left hip x-ray unremarkable.  Right hip x-ray shows avascular necrosis of the femoral head.  Chest x-ray shows acute right rib fractures 6 through 10.  CT head showed no acute findings.  CT C-spine showed no acute findings. His blood pressure is elevated with systolics in the 951O and diastolic in the 84Z. Other vital signs are stable. He has been given some morphine and Zofran in the emergency department.  Urinalysis is pending.  Labs fairly unremarkable with a mild hyponatremia.  Creatinine at 1.3 and hemoglobin 11.     ROS:   14 point review of systems  is negative except for what is mentioned above in the HPI.   Past Medical History:   Past Medical History:  Diagnosis Date   Anemia    CKD (chronic kidney disease), stage III (HCC)    Elevated alkaline phosphatase level    History of fracture of clavicle    History of fracture of rib    Hypertension    Pancytopenia (HCC)    PTSD (post-traumatic stress disorder)    [facility]   Thrombocytopenia ([place])     Past Surgical History:   Past Surgical History:  Procedure Laterality Date   NO PAST SURGERIES      Social History:   Social History   Socioeconomic History   Marital status: Single    Spouse name: Not on file   Number of children: Not on file   Years of education: Not on file   Highest education level: Not on file  Occupational History   Not on file  Tobacco Use   Smoking status: Every Day    Packs/day: 1.00    Years: 50.00    Pack years: 50.00    Types: Cigars, Cigarettes   Smokeless tobacco: Never   Tobacco comments:    53 PACK YR HX SMOKING SO HIGH RISK LUNG CANCER  Vaping Use   Vaping Use: Never used  Substance and Sexual Activity   Alcohol use: Yes  Alcohol/week: 0.0 standard drinks    Comment: "reports no alcohol use in 5-7 days"   Drug use: Yes    Comment: H/X OF CANNABIS ABUSE   Sexual activity: Not on file  Other Topics Concern   Not on file  Social History Narrative   Not on file   Social Determinants of Health   Financial Resource Strain: Not on file  Food Insecurity: Not on file  Transportation Needs: Not on file  Physical Activity: Not on file  Stress: Not on file  Social Connections: Not on file  Intimate Partner Violence: Not on file    Allergies:   Allergies  Allergen Reactions   Ace Inhibitors     Other reaction(s): Angioedema    Family History:   Family History  Problem Relation Age of Onset   Breast cancer Mother    Colon cancer Father    Prostate cancer Father      Current Medications:   Prior  to Admission medications   Medication Sig Start Date End Date Taking? Authorizing Provider  acetaminophen (TYLENOL) 325 MG tablet Take 1-2 tablets (325-650 mg total) by mouth every 4 (four) hours as needed for mild pain. 06/22/18   Love, Ivan Anchors, PA-C  albuterol (PROVENTIL HFA;VENTOLIN HFA) 108 (90 Base) MCG/ACT inhaler Inhale into the lungs every 6 (six) hours as needed for wheezing or shortness of breath.    [provider]  amLODipine (NORVASC) 10 MG tablet Take 1 tablet (10 mg total) by mouth daily. 06/22/18   Love, Ivan Anchors, PA-C  bisacodyl (DULCOLAX) 10 MG suppository Place 1 suppository (10 mg total) rectally daily at 6 (six) AM. 06/23/18   Love, Ivan Anchors, PA-C  carvedilol (COREG) 3.125 MG tablet Take 1 tablet (3.125 mg total) by mouth 2 (two) times daily with a meal. 06/22/18   Love, Ivan Anchors, PA-C  cloNIDine (CATAPRES) 0.2 MG tablet Take 1 tablet (0.2 mg total) by mouth 2 (two) times daily. 06/22/18   Love, Ivan Anchors, PA-C  gabapentin (NEURONTIN) 100 MG capsule Take 1 capsule (100 mg total) by mouth at bedtime. 06/22/18   Love, Ivan Anchors, PA-C  hydrALAZINE (APRESOLINE) 25 MG tablet Take 1 tablet (25 mg total) by mouth every 8 (eight) hours. 06/22/18   Love, Ivan Anchors, PA-C  latanoprost (XALATAN) 0.005 % ophthalmic solution Place 1 drop into both eyes at bedtime.    [provider]  lidocaine (LIDODERM) 5 % Apply to left shoulder at 7 am and remove at 7 pm daily.  PURCHASE THIS OVER THE COUNTER. 06/22/18   Love, Ivan Anchors, PA-C  Melatonin 3 MG TABS Take 2 tablets (6 mg total) by mouth at bedtime. IS OVER THE COUNTER. USED FOR SLEEP 06/22/18   Love, Ivan Anchors, PA-C  PARoxetine (PAXIL) 40 MG tablet Take 1 tablet (40 mg total) by mouth daily. 06/22/18   Love, Ivan Anchors, PA-C  polyethylene glycol (MIRALAX / GLYCOLAX) packet Take 17 g by mouth daily. 06/23/18   Love, Ivan Anchors, PA-C  sulfamethoxazole-trimethoprim (BACTRIM DS) 800-160 MG tablet Take 1 tablet by mouth 2 (two) times daily. 10/17/20   Sable Feil, PA-C  traZODone (DESYREL) 100 MG tablet Take 1 tablet (100 mg total) by mouth at bedtime. 06/22/18   Love, Ivan Anchors, PA-C  vitamin B-12 (CYANOCOBALAMIN) 1000 MCG tablet Take 1,000 mcg by mouth daily.     [provider]     Physical Exam:   Vitals:   12/08/21 1005 12/08/21 1006 12/08/21 1151  BP: Marland Kitchen)  200/86  (!) 205/92  Pulse: 71  63  Resp: 16  18  Temp: 98.7 F (37.1 C)    TempSrc: Oral    SpO2: 97%  99%  Weight:  68 kg   Height:  5\' 11"  (1.803 m)      General:  Appears calm and comfortable and is in NAD Cardiovascular:  RRR, no m/r/g.  Respiratory:   CTA bilaterally with no wheezes/rales/rhonchi.  Normal respiratory effort.  Right rib pain to palpation. Abdomen:  soft, NT, ND, NABS Skin:  no rash or induration seen on limited exam Musculoskeletal:  grossly normal tone BUE/BLE, good ROM, no bony abnormality Lower extremity:  No LE edema.  Limited foot exam with no ulcerations.  2+ distal pulses. Psychiatric:  grossly normal mood and affect, speech fluent and appropriate, AOx3 Neurologic:  CN 2-12 grossly intact, moves all extremities in coordinated fashion, sensation intact    Data Review:    Radiological Exams on Admission: Independently reviewed - see discussion in A/P where applicable  DG Ribs Unilateral W/Chest Right  Result Date: 12/08/2021 CLINICAL DATA:  [place] last night with right-sided rib pain EXAM: RIGHT RIBS AND CHEST - 3+ VIEW COMPARISON:  05/01/2020 FINDINGS: Nondisplaced fractures of the right lateral sixth through tenth ribs. No pneumothorax or hemothorax. Patchy density in the right lung base that could be atelectasis or right base pneumonia. Follow-up to clearing suggested. Old healed rib fractures present on the left. IMPRESSION: Fractures of the right lateral sixth through tenth ribs. No pneumothorax or hemothorax. Patchy density at the right lung base could be atelectasis or pneumonia. Follow-up to clearing recommended. Old healed rib  fractures on the left. Electronically Signed   By: Nelson Chimes M.D.   On: 12/08/2021 10:53   CT HEAD WO CONTRAST (5MM)  Result Date: 12/08/2021 CLINICAL DATA:  Fall last night EXAM: CT HEAD WITHOUT CONTRAST CT CERVICAL SPINE WITHOUT CONTRAST TECHNIQUE: Multidetector CT imaging of the head and cervical spine was performed following the standard protocol without intravenous contrast. Multiplanar CT image reconstructions of the cervical spine were also generated. COMPARISON:  None. FINDINGS: CT HEAD FINDINGS Brain: No evidence of acute infarction, hemorrhage, hydrocephalus, extra-axial collection or mass lesion/mass effect. Periventricular and deep white matter hypodensity. Vascular: No hyperdense vessel or unexpected calcification. Skull: Normal. Negative for fracture or focal lesion. Sinuses/Orbits: No acute finding. Other: Right eccentric subgaleal lipoma about the skull vertex (series 4, image 37). CT CERVICAL SPINE FINDINGS Alignment: Normal. Skull base and vertebrae: No acute fracture. No primary bone lesion or focal pathologic process. Soft tissues and spinal canal: No prevertebral fluid or swelling. No visible canal hematoma. Disc levels: Severe DISH of the cervical spine, with bony ankylosis of all included cervical and thoracic levels below C2 (series 6, image 37). There is posterior laminectomy and fusion C2 through C7. Upper chest: Moderate centrilobular emphysema. Other: Lipoma of the left neck underlying the platysma (series 3, image 71). IMPRESSION: 1. No acute intracranial pathology. Small-vessel white matter disease. 2. No fracture or static subluxation of the cervical spine. 3. Severe DISH of the cervical spine, with bony ankylosis of all included cervical and thoracic levels below C2. There is posterior laminectomy and fusion C2 through C7. 4. Emphysema. Emphysema (ICD10-J43.9). Electronically Signed   By: Delanna Ahmadi M.D.   On: 12/08/2021 12:31   CT Cervical Spine Wo Contrast  Result  Date: 12/08/2021 CLINICAL DATA:  Fall last night EXAM: CT HEAD WITHOUT CONTRAST CT CERVICAL SPINE WITHOUT CONTRAST TECHNIQUE: Multidetector CT imaging  of the head and cervical spine was performed following the standard protocol without intravenous contrast. Multiplanar CT image reconstructions of the cervical spine were also generated. COMPARISON:  None. FINDINGS: CT HEAD FINDINGS Brain: No evidence of acute infarction, hemorrhage, hydrocephalus, extra-axial collection or mass lesion/mass effect. Periventricular and deep white matter hypodensity. Vascular: No hyperdense vessel or unexpected calcification. Skull: Normal. Negative for fracture or focal lesion. Sinuses/Orbits: No acute finding. Other: Right eccentric subgaleal lipoma about the skull vertex (series 4, image 37). CT CERVICAL SPINE FINDINGS Alignment: Normal. Skull base and vertebrae: No acute fracture. No primary bone lesion or focal pathologic process. Soft tissues and spinal canal: No prevertebral fluid or swelling. No visible canal hematoma. Disc levels: Severe DISH of the cervical spine, with bony ankylosis of all included cervical and thoracic levels below C2 (series 6, image 37). There is posterior laminectomy and fusion C2 through C7. Upper chest: Moderate centrilobular emphysema. Other: Lipoma of the left neck underlying the platysma (series 3, image 71). IMPRESSION: 1. No acute intracranial pathology. Small-vessel white matter disease. 2. No fracture or static subluxation of the cervical spine. 3. Severe DISH of the cervical spine, with bony ankylosis of all included cervical and thoracic levels below C2. There is posterior laminectomy and fusion C2 through C7. 4. Emphysema. Emphysema (ICD10-J43.9). Electronically Signed   By: Delanna Ahmadi M.D.   On: 12/08/2021 12:31   DG HIP UNILAT WITH PELVIS 2-3 VIEWS LEFT  Result Date: 12/08/2021 CLINICAL DATA:  Fall EXAM: DG HIP (WITH OR WITHOUT PELVIS) 2-3V LEFT COMPARISON:  None. FINDINGS: There is  no evidence of hip fracture or dislocation. Mild-to-moderate degenerative changes of the bilateral hips. Soft tissues are unremarkable. IMPRESSION: No acute osseous abnormality. Electronically Signed   By: Yetta Glassman M.D.   On: 12/08/2021 10:51   DG HIP UNILAT WITH PELVIS 2-3 VIEWS RIGHT  Result Date: 12/08/2021 CLINICAL DATA:  Bilateral hip pain EXAM: DG HIP (WITH OR WITHOUT PELVIS) 2-3V RIGHT COMPARISON:  CT abdomen and pelvis 06/30/2020 FINDINGS: No acute fracture or dislocation identified in the right hip. Sclerotic changes at the superior femoral head consistent with avascular necrosis. Avascular necrosis also noted in the left femoral head. Mild narrowing of the hip joint space with subchondral acetabular sclerosis and inferior marginal osteophytes. No acute pelvic fracture visualized. IMPRESSION: 1. No acute fracture or dislocation identified in the right hip. 2. Avascular necrosis of the femoral head. Electronically Signed   By: Ofilia Neas M.D.   On: 12/08/2021 10:52     Labs on Admission: I have personally reviewed the available labs and imaging studies at the time of the admission.  Pertinent labs on Admission: Hemoglobin 11, hematocrit 34, sodium 132, creatinine 1.3     Assessment/Plan:    Mechanical fall: PT/OT have been ordered.  Right rib fractures: Imaging shows right rib fractures 6 through 10.  Lidoderm patches scheduled ordered.  Pain scale in place.  Incentive spirometry ordered.  Right hip AVN: Orthopedics consulted planned surgical repair inpatient versus outpatient. (Dr. Rudene Christians)  Hypertensive urgency: Likely uncontrolled.  Complicated by pain.  Continue home coreg and catapres. The patient is prescribed amlodipine 10 mg daily but taking only 5 mg daily. Will increase to his prescribed dose. Will also increase his hydralazine from 25 mg TID to 50 mg TID. Can further home oral agents to reach blood pressure goal. Hydralazine IV as needed for SBP >160  ordered.  Low back pain: x-ray of L-spine ordered  COPD: Not in acute exacerbation  Tobacco use disorder: Currently smokes 1/4 packs/day.  Counseled on smoking cessation.  Currently using nicotine gum at home to help with smoking cessation  PTSD: Continue home Paxil  Neuropathy: Continue home gabapentin  Chronic anemia: No evidence of bleeding. At baseline.  Chronic kidney disease stage IIIA: At baseline.  Ambulatory dysfunction: Uses a rollator and cane at baseline.  PT/OT.   Other information:    Level of Care: MedSurg DVT prophylaxis: SCDs Code Status: Full Consults: Ortho Admission status: Observation   Leslee Home DO [facility]   How to contact the [facility] Attending or Consulting provider [place] or covering provider during after hours [place], for this patient?  Check the care team in [facility] and look for a) attending/consulting TRH provider listed and b) the [facility] team listed Log into [email or web address] and use [place]'s universal password to access. If you do not have the password, please contact the hospital operator. Locate the [facility] provider you are looking for under [facility] and page to a number that you can be directly reached. If you still have difficulty reaching the provider, please page the [facility] (Director on Call) for the Hospitalists listed on amion for assistance.   12/08/2021, 1:22 PM

## 2021-12-09 DIAGNOSIS — W19XXXA Unspecified fall, initial encounter: Secondary | ICD-10-CM

## 2021-12-09 DIAGNOSIS — Y92009 Unspecified place in unspecified non-institutional (private) residence as the place of occurrence of the external cause: Secondary | ICD-10-CM | POA: Insufficient documentation

## 2021-12-09 DIAGNOSIS — S2249XA Multiple fractures of ribs, unspecified side, initial encounter for closed fracture: Secondary | ICD-10-CM

## 2021-12-09 LAB — CBC
HCT: 29.2 % — ABNORMAL LOW (ref 39.0–52.0)
Hemoglobin: 9.9 g/dL — ABNORMAL LOW (ref 13.0–17.0)
MCH: 26.1 pg (ref 26.0–34.0)
MCHC: 33.9 g/dL (ref 30.0–36.0)
MCV: 76.8 fL — ABNORMAL LOW (ref 80.0–100.0)
Platelets: 157 10*3/uL (ref 150–400)
RBC: 3.8 MIL/uL — ABNORMAL LOW (ref 4.22–5.81)
RDW: 14.4 % (ref 11.5–15.5)
WBC: 3.4 10*3/uL — ABNORMAL LOW (ref 4.0–10.5)
nRBC: 0 % (ref 0.0–0.2)

## 2021-12-09 LAB — BASIC METABOLIC PANEL
Anion gap: 6 (ref 5–15)
BUN: 19 mg/dL (ref 8–23)
CO2: 22 mmol/L (ref 22–32)
Calcium: 8.6 mg/dL — ABNORMAL LOW (ref 8.9–10.3)
Chloride: 104 mmol/L (ref 98–111)
Creatinine, Ser: 1.41 mg/dL — ABNORMAL HIGH (ref 0.61–1.24)
GFR, Estimated: 52 mL/min — ABNORMAL LOW (ref >60)
Glucose, Bld: 87 mg/dL (ref 70–99)
Potassium: 4.1 mmol/L (ref 3.5–5.1)
Sodium: 132 mmol/L — ABNORMAL LOW (ref 135–145)

## 2021-12-09 MED ORDER — PAROXETINE HCL 40 MG PO TABS: 20.0000 mg | ORAL_TABLET | Freq: Every day | ORAL | Status: DC

## 2021-12-09 NOTE — Evaluation (Signed)
Occupational Therapy Evaluation Patient Details Name: Gregory Hurley MRN: [id number] DOB: 24-Jun-1946 Today's Date: 12/09/2021   History of Present Illness 75 y.o. male with a past medical history of hypertension, PTSD, chronic kidney disease stage IIIA, chronic anemia, COPD, tobacco use disorder (currently smokes about 1/4 ppd), ambulatory dysfunction. Pt presents to ED s/p mechanical fall resulting in R rib fractures 6-10. Imaging also revealed avascular necrosis of the R hip.   Clinical Impression   Patient presenting with decreased Ind in self care, balance, functional mobility/transfers, endurance, and safety awareness. Patient reports living at home alone at mod I level PTA. Pt reports in his home he walks behind transport chair like a shopping cart and then sits and takes a rest as needed. He has an aide that comes 5x/wk for 2 hours to assist with self care tasks and IADLs. His brother lives next door and is able to provide assistance at discharge. Patient currently functioning at supervision level with use of RW. Pt does have increased pain from R rib fxs with mobility but motivated to return home.  Patient will benefit from acute OT to increase overall independence in the areas of ADLs, functional mobility, and safety awareness in order to safely discharge home with family.     Recommendations for follow up therapy are one component of a multi-disciplinary discharge planning process, led by the attending physician.  Recommendations may be updated based on patient status, additional functional criteria and insurance authorization.   Follow Up Recommendations  No OT follow up    Assistance Recommended at Discharge Intermittent Supervision/Assistance  Functional Status Assessment  Patient has had a recent decline in their functional status and demonstrates the ability to make significant improvements in function in a reasonable and predictable amount of time.  Equipment Recommendations  None  recommended by OT    Recommendations for Other Services       Precautions / Restrictions Precautions Precautions: Fall Restrictions Weight Bearing Restrictions: Yes RLE Weight Bearing: Weight bearing as tolerated LLE Weight Bearing: Weight bearing as tolerated      Mobility Bed Mobility Overal bed mobility: Modified Independent             General bed mobility comments: no physical assistance    Transfers Overall transfer level: Needs assistance Equipment used: Rolling walker (2 wheels) Transfers: Sit to/from Stand;Bed to chair/wheelchair/BSC Sit to Stand: Supervision     Step pivot transfers: Supervision     General transfer comment: increased time and effort due to pain      Balance Overall balance assessment: Needs assistance Sitting-balance support: No upper extremity supported;Feet supported Sitting balance-Leahy Scale: Normal     Standing balance support: Bilateral upper extremity supported;During functional activity Standing balance-Leahy Scale: Fair Standing balance comment: uses RW to steady, SUP in standing                           ADL either performed or assessed with clinical judgement   ADL Overall ADL's : At baseline                                             Vision Patient Visual Report: No change from baseline              Pertinent Vitals/Pain Pain Assessment: Faces Faces Pain Scale: Hurts little more Pain Location: R ribs  during mobility Pain Descriptors / Indicators: Aching;Sharp;Shooting;Grimacing Pain Intervention(s): Limited activity within patient's tolerance;Monitored during session;Repositioned     Hand Dominance Right   Extremity/Trunk Assessment Upper Extremity Assessment Upper Extremity Assessment: Overall WFL for tasks assessed;Generalized weakness   Lower Extremity Assessment Lower Extremity Assessment: Generalized weakness       Communication Communication Communication: No  difficulties   Cognition Arousal/Alertness: Awake/alert Behavior During Therapy: WFL for tasks assessed/performed Overall Cognitive Status: Within Functional Limits for tasks assessed                                 General Comments: A&Ox4. Pleasant and cooperative.                Home Living Family/patient expects to be discharged to:: Private residence Living Arrangements: Alone Available Help at Discharge: Family;Available 24 hours/day (brother in an apartment next door) Type of Home: Apartment Home Access: Level entry     Home Layout: One level     Bathroom Shower/Tub: Teacher, early years/pre: Standard Bathroom Accessibility: No   Home Equipment: Scientist, research (medical) (2 wheels);Cane - single point;Tub bench;Toilet riser   Additional Comments: Pushes transport chair as AD around the apartment. Uses cane for community outings.      Prior Functioning/Environment Prior Level of Function : History of Falls (last six months);Independent/Modified Independent;Needs assist       Physical Assist : ADLs (physical)   ADLs (physical): IADLs;Bathing Mobility Comments: Mod I with limited distance ambulation (able to walk ~263ft to dumpster and back). Baseline ambulation deficits - difficulty clearing RLE during swing phase. ADLs Comments: CNA comes to the house 5 days a week for 2 hours to assist with cooking/cleaning and bathing pt.        OT Problem List: Decreased strength;Decreased activity tolerance;Impaired balance (sitting and/or standing);Decreased safety awareness;Pain;Decreased knowledge of use of DME or AE      OT Treatment/Interventions: Self-care/ADL training;Therapeutic exercise;Neuromuscular education;Energy conservation;DME and/or AE instruction;Therapeutic activities;Balance training;Manual therapy;Patient/family education    OT Goals(Current goals can be found in the care plan section) Acute Rehab OT Goals Patient Stated  Goal: to go home OT Goal Formulation: With patient Time For Goal Achievement: 12/23/21 Potential to Achieve Goals: Good ADL Goals Pt Will Perform Grooming: with modified independence Pt Will Perform Lower Body Dressing: with modified independence;sit to/from stand Pt Will Transfer to Toilet: with modified independence;ambulating Pt Will Perform Toileting - Clothing Manipulation and hygiene: with modified independence;sit to/from stand  OT Frequency: Min 2X/week   Barriers to D/C:    none known at this time          AM-PAC OT "6 Clicks" Daily Activity     Outcome Measure Help from another person eating meals?: None Help from another person taking care of personal grooming?: None Help from another person toileting, which includes using toliet, bedpan, or urinal?: A Little Help from another person bathing (including washing, rinsing, drying)?: A Little Help from another person to put on and taking off regular upper body clothing?: None Help from another person to put on and taking off regular lower body clothing?: A Little 6 Click Score: 21   End of Session Equipment Utilized During Treatment: Rolling walker (2 wheels) Nurse Communication: Mobility status  Activity Tolerance: Patient tolerated treatment well Patient left: in chair;with call bell/phone within reach;with chair alarm set  OT Visit Diagnosis: Unsteadiness on feet (R26.81);Repeated falls (R29.6);Muscle weakness (generalized) (M62.81)  Time: 6553-7482 OT Time Calculation (min): 16 min Charges:  OT General Charges $OT Visit: 1 Visit OT Evaluation $OT Eval Low Complexity: 1 Low OT Treatments $Self Care/Home Management : 8-22 mins  Darleen Crocker, MS, OTR/L , CBIS ascom [phone]  12/09/21, 11:54 AM

## 2021-12-09 NOTE — Discharge Instructions (Signed)
You had multiple rib fractures that can be painful but it is important to continue to take deep breaths. Expanding your lungs fully can help prevent infection after rib fractures.  Follow up with ortho surgery in 2-3 weeks for your hip condition.

## 2021-12-09 NOTE — Evaluation (Signed)
Physical Therapy Evaluation Patient Details Name: Gregory Hurley MRN: [id number] DOB: [birth year] Today's Date: 12/09/2021  History of Present Illness  75 y.o. male with a past medical history of hypertension, PTSD, chronic kidney disease stage IIIA, chronic anemia, COPD, tobacco use disorder (currently smokes about 1/4 ppd), ambulatory dysfunction. Pt presents to ED s/p mechanical fall resulting in R rib fractures 6-10. Imaging also revealed avascular necrosis of the R hip.  Clinical Impression  Pt received sitting in chair, recently seen by OT and agreeable to therapy. Pt initially asked for assistance with standing from low surface of recliner - PT encouraged independence stating pt would need to be able to complete this activity on his own if he is to go home. Pt was able to perform STS with SUP and BUE support of RW. He then ambulated 159ft using RW. He demonstrates altered gait mechanics however states these are his baseline. Deficits include decreased right foot clearance during swing phase due to decreased hip flexion and DF. Increased stance time on left to pull RLE through. Step-to pattern leading with LLE. Increased difficulty during turns. Pt moves cautiously and is aware of deficits. Pt provided with safety recommendations such as using RW at home rather than transport chair. Pt does not require further PT needs as he is currently at baseline. Pt owns all DME required.      Recommendations for follow up therapy are one component of a multi-disciplinary discharge planning process, led by the attending physician.  Recommendations may be updated based on patient status, additional functional criteria and insurance authorization.  Follow Up Recommendations No PT follow up    Assistance Recommended at Discharge PRN  Functional Status Assessment Patient has had a recent decline in their functional status and demonstrates the ability to make significant improvements in function in a reasonable and  predictable amount of time.  Equipment Recommendations  None recommended by PT    Recommendations for Other Services       Precautions / Restrictions Precautions Precautions: Fall Restrictions Weight Bearing Restrictions: Yes RLE Weight Bearing: Weight bearing as tolerated LLE Weight Bearing: Weight bearing as tolerated      Mobility  Bed Mobility                    Transfers Overall transfer level: Needs assistance Equipment used: Rolling walker (2 wheels) Transfers: Sit to/from Stand Sit to Stand: Supervision           General transfer comment: increased time and effort due to pain    Ambulation/Gait Ambulation/Gait assistance: Supervision Gait Distance (Feet): 150 Feet Assistive device: Rolling walker (2 wheels) Gait Pattern/deviations: Step-to pattern;Trunk flexed Gait velocity: decreased     General Gait Details: decreased right foot clearance during swing phase due to decreased hip flexion and DF. Increased stance time on left to pull RLE through. Step-to leading with LLE. Increased difficulty during turns. Pt moves cautiously and is aware of deficits.  Stairs            Wheelchair Mobility    Modified Rankin (Stroke Patients Only)       Balance Overall balance assessment: Needs assistance Sitting-balance support: No upper extremity supported;Feet supported Sitting balance-Leahy Scale: Normal     Standing balance support: Bilateral upper extremity supported;During functional activity Standing balance-Leahy Scale: Fair Standing balance comment: uses RW to steady, SUP in standing  Pertinent Vitals/Pain Pain Assessment: Faces Faces Pain Scale: Hurts even more Pain Location: R ribs during mobility Pain Descriptors / Indicators: Aching;Sharp;Shooting;Grimacing Pain Intervention(s): Limited activity within patient's tolerance;Monitored during session;Repositioned    Home Living Family/patient  expects to be discharged to:: Private residence Living Arrangements: Alone Available Help at Discharge: Family;Available 24 hours/day (brother lives in apartment next door) Type of Home: Apartment Home Access: Level entry       Home Layout: One level Home Equipment: Scientist, research (medical) (2 wheels);Cane - single point;Tub bench;Toilet riser Additional Comments: Pushes transport chair as AD around the apartment. Uses cane for community outings.    Prior Function Prior Level of Function : History of Falls (last six months);Independent/Modified Independent;Needs assist       Physical Assist : ADLs (physical)   ADLs (physical): IADLs;Bathing Mobility Comments: Mod I with limited distance ambulation (able to walk ~254ft to dumpster and back). Baseline ambulation deficits - difficulty clearing RLE during swing phase. ADLs Comments: CNA comes to the house 5 days a week for 2 hours to assist with cooking/cleaning and bathing pt.     Hand Dominance        Extremity/Trunk Assessment   Upper Extremity Assessment Upper Extremity Assessment: Overall WFL for tasks assessed    Lower Extremity Assessment Lower Extremity Assessment: Generalized weakness (baseline focal deficits in RLE s/p neck fx 2 years ago. RLE avascular necrosis. Not formally tested due to pain.)       Communication   Communication: No difficulties  Cognition Arousal/Alertness: Awake/alert Behavior During Therapy: WFL for tasks assessed/performed Overall Cognitive Status: Within Functional Limits for tasks assessed                                 General Comments: A&Ox4        General Comments      Exercises     Assessment/Plan    PT Assessment Patient does not need any further PT services  PT Problem List         PT Treatment Interventions      PT Goals (Current goals can be found in the Care Plan section)  Acute Rehab PT Goals Patient Stated Goal: to go home PT Goal  Formulation: With patient Time For Goal Achievement: 12/23/21 Potential to Achieve Goals: Good    Frequency     Barriers to discharge        Co-evaluation               AM-PAC PT "6 Clicks" Mobility  Outcome Measure Help needed turning from your back to your side while in a flat bed without using bedrails?: A Little Help needed moving from lying on your back to sitting on the side of a flat bed without using bedrails?: A Little Help needed moving to and from a bed to a chair (including a wheelchair)?: A Little Help needed standing up from a chair using your arms (e.g., wheelchair or bedside chair)?: A Little Help needed to walk in hospital room?: A Little Help needed climbing 3-5 steps with a railing? : A Little 6 Click Score: 18    End of Session   Activity Tolerance: Patient limited by pain;Patient tolerated treatment well Patient left: in chair;with call bell/phone within reach Nurse Communication: Mobility status PT Visit Diagnosis: Other abnormalities of gait and mobility (R26.89);Difficulty in walking, not elsewhere classified (R26.2);History of falling (Z91.81);Muscle weakness (generalized) (M62.81)    Time: 1010-1030  PT Time Calculation (min) (ACUTE ONLY): 20 min   Charges:   PT Evaluation $PT Eval Moderate Complexity: 1 Mod PT Treatments $Therapeutic Activity: 8-22 mins        Patrina Levering PT, DPT 12/09/21 10:59 AM [phone]

## 2021-12-09 NOTE — Discharge Summary (Signed)
Physician Discharge Summary  Gregory Hurley AJG:[id number] DOB: 1946/12/02 DOA: 12/08/2021  PCP: [facility]  Admit date: 12/08/2021 Discharge date: 12/09/2021  Admitted From: Home Disposition: Home  Recommendations for Outpatient Follow-up:  Follow up with PCP within 1-2 weeks for pain control, respiratory status check after rib fxs Follow up with ortho in about 2-4 weeks to monitor hip AVS   Discharge Condition:stable, improved CODE STATUS:  Code Status: Full Code  Regular healthy diet  Brief/Interim Summary: Pt presents after fall resulting in rib fractures. Incidentally, he was found to have AVS of right hip. Ortho evaluated and recommended OP follow up. He was evaluated by PT/OT who did not recommend follow up. He was otherwise stable on home medications so was discharged home in good condition. Prevention of hypoinflation and infection was discussed in setting of rib fracture pain.  Discharge Diagnoses:  Principal Problem:   Avascular necrosis of bone of hip, right (HCC) Active Problems:   Multiple rib fractures involving four or more ribs  Allergies as of 12/09/2021       Reactions   Ace Inhibitors    Other reaction(s): Angioedema        Medication List     TAKE these medications    acetaminophen 325 MG tablet Commonly known as: TYLENOL Take 1-2 tablets (325-650 mg total) by mouth every 4 (four) hours as needed for mild pain.   amLODipine 10 MG tablet Commonly known as: NORVASC Take 1 tablet (10 mg total) by mouth daily. What changed: how much to take   bisacodyl 10 MG suppository Commonly known as: DULCOLAX Place 1 suppository (10 mg total) rectally daily at 6 (six) AM.   carvedilol 3.125 MG tablet Commonly known as: COREG Take 1 tablet (3.125 mg total) by mouth 2 (two) times daily with a meal.   cloNIDine 0.2 MG tablet Commonly known as: CATAPRES Take 1 tablet (0.2 mg total) by mouth 2 (two) times daily.   gabapentin 100 MG  capsule Commonly known as: NEURONTIN Take 1 capsule (100 mg total) by mouth at bedtime. What changed: when to take this   hydrALAZINE 25 MG tablet Commonly known as: APRESOLINE Take 1 tablet (25 mg total) by mouth every 8 (eight) hours.   latanoprost 0.005 % ophthalmic solution Commonly known as: XALATAN Place 1 drop into both eyes at bedtime.   melatonin 3 MG Tabs tablet Take 2 tablets (6 mg total) by mouth at bedtime. IS OVER THE COUNTER. USED FOR SLEEP What changed: how much to take   PARoxetine 40 MG tablet Commonly known as: PAXIL Take 0.5 tablets (20 mg total) by mouth daily.   polyethylene glycol 17 g packet Commonly known as: MIRALAX / GLYCOLAX Take 17 g by mouth daily.   vitamin B-12 1000 MCG tablet Commonly known as: CYANOCOBALAMIN Take 1,000 mcg by mouth daily.        Allergies  Allergen Reactions   Ace Inhibitors     Other reaction(s): Angioedema    Consultations: Ortho   Procedures/Studies: DG Ribs Unilateral W/Chest Right  Result Date: 12/08/2021 CLINICAL DATA:  [place] last night with right-sided rib pain EXAM: RIGHT RIBS AND CHEST - 3+ VIEW COMPARISON:  05/01/2020 FINDINGS: Nondisplaced fractures of the right lateral sixth through tenth ribs. No pneumothorax or hemothorax. Patchy density in the right lung base that could be atelectasis or right base pneumonia. Follow-up to clearing suggested. Old healed rib fractures present on the left. IMPRESSION: Fractures of the right lateral sixth through tenth ribs. No  pneumothorax or hemothorax. Patchy density at the right lung base could be atelectasis or pneumonia. Follow-up to clearing recommended. Old healed rib fractures on the left. Electronically Signed   By: Nelson Chimes M.D.   On: 12/08/2021 10:53   DG Lumbar Spine 2-3 Views  Result Date: 12/08/2021 CLINICAL DATA:  Fall EXAM: LUMBAR SPINE - 2-3 VIEW COMPARISON:  CT abdomen/pelvis dated 06/30/2020. FINDINGS: Five lumbar type vertebral bodies. Normal  lumbar lordosis. Moderate multilevel degenerative changes. No evidence of fracture or dislocation prevertebral body heights are maintained. Visualized bony pelvis appears intact. IMPRESSION: No fracture or dislocation is seen. Moderate multilevel degenerative changes. Electronically Signed   By: Julian Hy M.D.   On: 12/08/2021 15:04   CT HEAD WO CONTRAST (5MM)  Result Date: 12/08/2021 CLINICAL DATA:  Fall last night EXAM: CT HEAD WITHOUT CONTRAST CT CERVICAL SPINE WITHOUT CONTRAST TECHNIQUE: Multidetector CT imaging of the head and cervical spine was performed following the standard protocol without intravenous contrast. Multiplanar CT image reconstructions of the cervical spine were also generated. COMPARISON:  None. FINDINGS: CT HEAD FINDINGS Brain: No evidence of acute infarction, hemorrhage, hydrocephalus, extra-axial collection or mass lesion/mass effect. Periventricular and deep white matter hypodensity. Vascular: No hyperdense vessel or unexpected calcification. Skull: Normal. Negative for fracture or focal lesion. Sinuses/Orbits: No acute finding. Other: Right eccentric subgaleal lipoma about the skull vertex (series 4, image 37). CT CERVICAL SPINE FINDINGS Alignment: Normal. Skull base and vertebrae: No acute fracture. No primary bone lesion or focal pathologic process. Soft tissues and spinal canal: No prevertebral fluid or swelling. No visible canal hematoma. Disc levels: Severe DISH of the cervical spine, with bony ankylosis of all included cervical and thoracic levels below C2 (series 6, image 37). There is posterior laminectomy and fusion C2 through C7. Upper chest: Moderate centrilobular emphysema. Other: Lipoma of the left neck underlying the platysma (series 3, image 71). IMPRESSION: 1. No acute intracranial pathology. Small-vessel white matter disease. 2. No fracture or static subluxation of the cervical spine. 3. Severe DISH of the cervical spine, with bony ankylosis of all included  cervical and thoracic levels below C2. There is posterior laminectomy and fusion C2 through C7. 4. Emphysema. Emphysema (ICD10-J43.9). Electronically Signed   By: Delanna Ahmadi M.D.   On: 12/08/2021 12:31   CT Cervical Spine Wo Contrast  Result Date: 12/08/2021 CLINICAL DATA:  Fall last night EXAM: CT HEAD WITHOUT CONTRAST CT CERVICAL SPINE WITHOUT CONTRAST TECHNIQUE: Multidetector CT imaging of the head and cervical spine was performed following the standard protocol without intravenous contrast. Multiplanar CT image reconstructions of the cervical spine were also generated. COMPARISON:  None. FINDINGS: CT HEAD FINDINGS Brain: No evidence of acute infarction, hemorrhage, hydrocephalus, extra-axial collection or mass lesion/mass effect. Periventricular and deep white matter hypodensity. Vascular: No hyperdense vessel or unexpected calcification. Skull: Normal. Negative for fracture or focal lesion. Sinuses/Orbits: No acute finding. Other: Right eccentric subgaleal lipoma about the skull vertex (series 4, image 37). CT CERVICAL SPINE FINDINGS Alignment: Normal. Skull base and vertebrae: No acute fracture. No primary bone lesion or focal pathologic process. Soft tissues and spinal canal: No prevertebral fluid or swelling. No visible canal hematoma. Disc levels: Severe DISH of the cervical spine, with bony ankylosis of all included cervical and thoracic levels below C2 (series 6, image 37). There is posterior laminectomy and fusion C2 through C7. Upper chest: Moderate centrilobular emphysema. Other: Lipoma of the left neck underlying the platysma (series 3, image 71). IMPRESSION: 1. No acute intracranial pathology. Small-vessel  white matter disease. 2. No fracture or static subluxation of the cervical spine. 3. Severe DISH of the cervical spine, with bony ankylosis of all included cervical and thoracic levels below C2. There is posterior laminectomy and fusion C2 through C7. 4. Emphysema. Emphysema (ICD10-J43.9).  Electronically Signed   By: Delanna Ahmadi M.D.   On: 12/08/2021 12:31   DG HIP UNILAT WITH PELVIS 2-3 VIEWS LEFT  Result Date: 12/08/2021 CLINICAL DATA:  Fall EXAM: DG HIP (WITH OR WITHOUT PELVIS) 2-3V LEFT COMPARISON:  None. FINDINGS: There is no evidence of hip fracture or dislocation. Mild-to-moderate degenerative changes of the bilateral hips. Soft tissues are unremarkable. IMPRESSION: No acute osseous abnormality. Electronically Signed   By: Yetta Glassman M.D.   On: 12/08/2021 10:51   DG HIP UNILAT WITH PELVIS 2-3 VIEWS RIGHT  Result Date: 12/08/2021 CLINICAL DATA:  Bilateral hip pain EXAM: DG HIP (WITH OR WITHOUT PELVIS) 2-3V RIGHT COMPARISON:  CT abdomen and pelvis 06/30/2020 FINDINGS: No acute fracture or dislocation identified in the right hip. Sclerotic changes at the superior femoral head consistent with avascular necrosis. Avascular necrosis also noted in the left femoral head. Mild narrowing of the hip joint space with subchondral acetabular sclerosis and inferior marginal osteophytes. No acute pelvic fracture visualized. IMPRESSION: 1. No acute fracture or dislocation identified in the right hip. 2. Avascular necrosis of the femoral head. Electronically Signed   By: Ofilia Neas M.D.   On: 12/08/2021 10:52    Subjective: Patient states that he feels well. He has no concerns at this time. He states that he understands the follow up plan with ortho. He is agreeable to going home today.   Discharge Exam: Vitals:   12/09/21 1200 12/09/21 1300  BP: (!) 127/58 (!) 125/48  Pulse: (!) 47 (!) 53  Resp: 17 16  Temp:    SpO2: 99% 96%    General: Pt is alert, awake, not in acute distress Cardiovascular: RRR, S1/S2 +, no rubs, no gallops Respiratory: CTA bilaterally, no wheezing, no rhonchi Abdominal: Soft, NT, ND, bowel sounds + Extremities: no edema, no cyanosis  Labs: Basic Metabolic Panel: Recent Labs  Lab 12/08/21 1151 12/09/21 0745  NA 132* 132*  K 4.5 4.1  CL  102 104  CO2 23 22  GLUCOSE 90 87  BUN 17 19  CREATININE 1.30* 1.41*  CALCIUM 8.9 8.6*   CBC: Recent Labs  Lab 12/08/21 1151 12/09/21 0745  WBC 5.2 3.4*  NEUTROABS 3.9  --   HGB 11.5* 9.9*  HCT 34.7* 29.2*  MCV 77.3* 76.8*  PLT 157 157    Microbiology Recent Results (from the past 240 hour(s))  Resp Panel by RT-PCR (Flu A&B, Covid) Nasopharyngeal Swab     Status: None   Collection Time: 12/08/21 11:57 AM   Specimen: Nasopharyngeal Swab; Nasopharyngeal(NP) swabs in vial transport medium  Result Value Ref Range Status   SARS Coronavirus 2 by RT PCR NEGATIVE NEGATIVE Final    Comment: (NOTE) SARS-CoV-2 target nucleic acids are NOT DETECTED.  The SARS-CoV-2 RNA is generally detectable in upper respiratory specimens during the acute phase of infection. The lowest concentration of SARS-CoV-2 viral copies this assay can detect is 138 copies/mL. A negative result does not preclude SARS-Cov-2 infection and should not be used as the sole basis for treatment or other patient management decisions. A negative result may occur with  improper specimen collection/handling, submission of specimen other than nasopharyngeal swab, presence of viral mutation(s) within the areas targeted by this assay, and  inadequate number of viral copies(<138 copies/mL). A negative result must be combined with clinical observations, patient history, and epidemiological information. The expected result is Negative.  Fact Sheet for Patients:  EntrepreneurPulse.com.au  Fact Sheet for Healthcare Providers:  IncredibleEmployment.be  This test is no t yet approved or cleared by the [place] FDA and  has been authorized for detection and/or diagnosis of SARS-CoV-2 by FDA under an Emergency Use Authorization (EUA). This EUA will remain  in effect (meaning this test can be used) for the duration of the COVID-19 declaration under Section 564(b)(1) of the Act,  21 U.S.C.section 360bbb-3(b)(1), unless the authorization is terminated  or revoked sooner.       Influenza A by PCR NEGATIVE NEGATIVE Final   Influenza B by PCR NEGATIVE NEGATIVE Final    Comment: (NOTE) The Xpert Xpress SARS-CoV-2/FLU/RSV plus assay is intended as an aid in the diagnosis of influenza from Nasopharyngeal swab specimens and should not be used as a sole basis for treatment. Nasal washings and aspirates are unacceptable for Xpert Xpress SARS-CoV-2/FLU/RSV testing.  Fact Sheet for Patients: EntrepreneurPulse.com.au  Fact Sheet for Healthcare Providers: IncredibleEmployment.be  This test is not yet approved or cleared by the [place] FDA and has been authorized for detection and/or diagnosis of SARS-CoV-2 by FDA under an Emergency Use Authorization (EUA). This EUA will remain in effect (meaning this test can be used) for the duration of the COVID-19 declaration under Section 564(b)(1) of the Act, 21 U.S.C. section 360bbb-3(b)(1), unless the authorization is terminated or revoked.  Performed at [facility], [address] [place], [place] [address]     Time coordinating discharge: Over 30 minutes  Richarda Osmond, MD  [facility] 12/09/2021, 2:42 PM

## 2021-12-09 NOTE — ED Notes (Signed)
Per pt, daughter Clarene Critchley called to come and pick up since he will be discharged soon. No answer, left a voicemail

## 2022-04-21 ENCOUNTER — Encounter: Payer: No Typology Code available for payment source | Attending: Internal Medicine

## 2022-04-21 ENCOUNTER — Other Ambulatory Visit: Payer: Self-pay

## 2022-04-21 DIAGNOSIS — I10 Essential (primary) hypertension: Secondary | ICD-10-CM | POA: Insufficient documentation

## 2022-04-21 DIAGNOSIS — Z85118 Personal history of other malignant neoplasm of bronchus and lung: Secondary | ICD-10-CM | POA: Insufficient documentation

## 2022-04-21 DIAGNOSIS — R911 Solitary pulmonary nodule: Secondary | ICD-10-CM | POA: Insufficient documentation

## 2022-04-21 NOTE — Progress Notes (Signed)
Virtual Visit completed. Patient informed on EP and RD appointment and 6 Minute walk test. Patient also informed of patient health questionnaires on My Chart. Patient Verbalizes understanding. Visit diagnosis can be found in [facility] in media. [facility] is [facility]. Virtual Visit completed. Patient informed on EP and RD appointment and 6 Minute walk test. Patient also informed of patient health questionnaires on My Chart. Patient Verbalizes understanding. Visit diagnosis can be found in [facility] in media. [facility] is [facility]. Patient is to bring his medication list. His daughter whome is not there at the moment fixes his medications for him and knows what he takes. ?

## 2022-04-27 ENCOUNTER — Telehealth: Payer: Self-pay

## 2022-04-27 VITALS — Ht 70.0 in | Wt 159.5 lb

## 2022-04-27 DIAGNOSIS — Z85118 Personal history of other malignant neoplasm of bronchus and lung: Secondary | ICD-10-CM | POA: Diagnosis not present

## 2022-04-27 DIAGNOSIS — R911 Solitary pulmonary nodule: Secondary | ICD-10-CM | POA: Diagnosis not present

## 2022-04-27 DIAGNOSIS — I10 Essential (primary) hypertension: Secondary | ICD-10-CM | POA: Diagnosis not present

## 2022-04-27 NOTE — Patient Instructions (Signed)
Patient Instructions ? ?Patient Details  ?Name: Gregory Hurley ?MRN: [id number] ?Date of Birth: [birth year] ?Referring Provider:  Terese Door* ? ?Below are your personal goals for exercise, nutrition, and risk factors. Our goal is to help you stay on track towards obtaining and maintaining these goals. We will be discussing your progress on these goals with you throughout the program. ? ?Initial Exercise Prescription: ? Initial Exercise Prescription - 04/27/22 1200   ? ?  ? Date of Initial Exercise RX and Referring Provider  ? Date 04/27/22   ? Referring Provider Terese Door, MD ([place])   ?  ? Oxygen  ? Maintain Oxygen Saturation 88% or higher   ?  ? Recumbant Bike  ? Level 1   ? RPM 60   ? Watts 8   ? Minutes 15   ? METs 1   ?  ? NuStep  ? Level 1   ? SPM 80   ? Minutes 15   ? METs 1   ?  ? REL-XR  ? Level 1   ? Speed 50   ? Minutes 15   ? METs 1   ?  ? Biostep-RELP  ? Level 1   ? SPM 50   ? Minutes 15   ? METs 1   ?  ? Track  ? Laps 3   as tolerated with right leg  ? Minutes 15   ? METs 1   ?  ? Prescription Details  ? Frequency (times per week) 3   ? Duration Progress to 30 minutes of continuous aerobic without signs/symptoms of physical distress   ?  ? Intensity  ? THRR 40-80% of Max Heartrate 88 - 126   ? Ratings of Perceived Exertion 11-13   ? Perceived Dyspnea 0-4   ?  ? Progression  ? Progression Continue to progress workloads to maintain intensity without signs/symptoms of physical distress.   ?  ? Resistance Training  ? Training Prescription Yes   ? Weight 2 lb   ? Reps 10-15   ? ?  ?  ? ?  ? ? ?Exercise Goals: ?Frequency: Be able to perform aerobic exercise two to three times per week in program working toward 2-5 days per week of home exercise. ? ?Intensity: Work with a perceived exertion of 11 (fairly light) - 15 (hard) while following your exercise prescription.  We will make changes to your prescription with you as you progress through the program. ?  ?Duration: Be able to do 30 to 45  minutes of continuous aerobic exercise in addition to a 5 minute warm-up and a 5 minute cool-down routine. ?  ?Nutrition Goals: ?Your personal nutrition goals will be established when you do your nutrition analysis with the dietician. ? ?The following are general nutrition guidelines to follow: ?Cholesterol < '200mg'$ /day ?Sodium < '1500mg'$ /day ?Fiber: Men over 50 yrs - 30 grams per day ? ?Personal Goals: ? Personal Goals and Risk Factors at Admission - 04/27/22 1306   ? ?  ? Core Components/Risk Factors/Patient Goals on Admission  ?  Weight Management Yes;Weight Maintenance   ? Intervention Weight Management: Develop a combined nutrition and exercise program designed to reach desired caloric intake, while maintaining appropriate intake of nutrient and fiber, sodium and fats, and appropriate energy expenditure required for the weight goal.;Weight Management: Provide education and appropriate resources to help participant work on and attain dietary goals.;Weight Management/Obesity: Establish reasonable short term and long term weight goals.   ? Admit Weight 159 lb (  72.1 kg)   ? Goal Weight: Short Term 159 lb (72.1 kg)   ? Goal Weight: Long Term 159 lb (72.1 kg)   ? Expected Outcomes Short Term: Continue to assess and modify interventions until short term weight is achieved;Long Term: Adherence to nutrition and physical activity/exercise program aimed toward attainment of established weight goal;Weight Maintenance: Understanding of the daily nutrition guidelines, which includes 25-35% calories from fat, 7% or less cal from saturated fats, less than '200mg'$  cholesterol, less than 1.5gm of sodium, & 5 or more servings of fruits and vegetables daily;Understanding recommendations for meals to include 15-35% energy as protein, 25-35% energy from fat, 35-60% energy from carbohydrates, less than '200mg'$  of dietary cholesterol, 20-35 gm of total fiber daily;Understanding of distribution of calorie intake throughout the day with the  consumption of 4-5 meals/snacks   ? Tobacco Cessation Yes   using nicotine gum  ? Number of packs per day 0   ? Intervention Assist the participant in steps to quit. Provide individualized education and counseling about committing to Tobacco Cessation, relapse prevention, and pharmacological support that can be provided by physician.;Advice worker, assist with locating and accessing local/national Quit Smoking programs, and support quit date choice.   ? Expected Outcomes Short Term: Will demonstrate readiness to quit, by selecting a quit date.;Short Term: Will quit all tobacco product use, adhering to prevention of relapse plan.   ? Improve shortness of breath with ADL's Yes   ? Intervention Provide education, individualized exercise plan and daily activity instruction to help decrease symptoms of SOB with activities of daily living.   ? Expected Outcomes Short Term: Improve cardiorespiratory fitness to achieve a reduction of symptoms when performing ADLs;Long Term: Be able to perform more ADLs without symptoms or delay the onset of symptoms   ? Hypertension Yes   ? Intervention Provide education on lifestyle modifcations including regular physical activity/exercise, weight management, moderate sodium restriction and increased consumption of fresh fruit, vegetables, and low fat dairy, alcohol moderation, and smoking cessation.;Monitor prescription use compliance.   ? Expected Outcomes Short Term: Continued assessment and intervention until BP is < 140/19m HG in hypertensive participants. < 130/859mHG in hypertensive participants with diabetes, heart failure or chronic kidney disease.;Long Term: Maintenance of blood pressure at goal levels.   ? ?  ?  ? ?  ? ? ?Tobacco Use Initial Evaluation: ?Social History  ? ?Tobacco Use  ?Smoking Status Former  ? Packs/day: 1.00  ? Years: 50.00  ? Pack years: 50.00  ? Types: Cigars, Cigarettes  ? Quit date: 05/20/2021  ? Years since quitting: 0.9  ?Smokeless  Tobacco Never  ?Tobacco Comments  ? [age]ACK YR HX SMOKING SO HIGH RISK LUNG CANCER  ? ? ?Exercise Goals and Review: ? Exercise Goals   ? ? [place] 04/27/22 1304  ?  ?  ?  ?  ?  ? Exercise Goals  ? Increase Physical Activity Yes      ? Intervention Provide advice, education, support and counseling about physical activity/exercise needs.;Develop an individualized exercise prescription for aerobic and resistive training based on initial evaluation findings, risk stratification, comorbidities and participant's personal goals.      ? Expected Outcomes Short Term: Attend rehab on a regular basis to increase amount of physical activity.;Long Term: Add in home exercise to make exercise part of routine and to increase amount of physical activity.;Long Term: Exercising regularly at least 3-5 days a week.      ? Increase Strength and Stamina  Yes      ? Intervention Provide advice, education, support and counseling about physical activity/exercise needs.;Develop an individualized exercise prescription for aerobic and resistive training based on initial evaluation findings, risk stratification, comorbidities and participant's personal goals.      ? Expected Outcomes Short Term: Increase workloads from initial exercise prescription for resistance, speed, and METs.;Short Term: Perform resistance training exercises routinely during rehab and add in resistance training at home;Long Term: Improve cardiorespiratory fitness, muscular endurance and strength as measured by increased METs and functional capacity (6MWT)      ? Able to understand and use rate of perceived exertion (RPE) scale Yes      ? Intervention Provide education and explanation on how to use RPE scale      ? Expected Outcomes Short Term: Able to use RPE daily in rehab to express subjective intensity level;Long Term:  Able to use RPE to guide intensity level when exercising independently      ? Able to understand and use Dyspnea scale Yes      ? Intervention Provide  education and explanation on how to use Dyspnea scale      ? Expected Outcomes Short Term: Able to use Dyspnea scale daily in rehab to express subjective sense of shortness of breath during exertion;Long Term: A

## 2022-04-27 NOTE — Telephone Encounter (Signed)
Called and LVM asking for patient to please return my call. Patient is listed on our office reports as being a patient here. No record of this.  ? ?OK for [facility] to confirm the above information and find out where the patient is currently going for primary care.  ?

## 2022-04-27 NOTE — Progress Notes (Signed)
Pulmonary Individual Treatment Plan ? ?Patient Details  ?Name: Gregory Hurley ?MRN: [id number] ?Date of Birth: 1946/10/19 ?Referring Provider:   ?Flowsheet Row Pulmonary Rehab from 04/27/2022 in [facility] Cardiac and Pulmonary Rehab  ?Referring Provider Terese Door, MD ([place])  ? ?  ? ? ?Initial Encounter Date:  ?Flowsheet Row Pulmonary Rehab from 04/27/2022 in [facility] Cardiac and Pulmonary Rehab  ?Date 04/27/22  ? ?  ? ? ?Visit Diagnosis: Lung nodule ? ?Patient's Home Medications on Admission: ? ?Current Outpatient Medications:  ?  acetaminophen (TYLENOL) 325 MG tablet, Take 1-2 tablets (325-650 mg total) by mouth every 4 (four) hours as needed for mild pain., Disp: , Rfl:  ?  amLODipine (NORVASC) 10 MG tablet, Take 1 tablet (10 mg total) by mouth daily., Disp: 30 tablet, Rfl: 0 ?  aspirin 81 MG chewable tablet, Chew by mouth daily., Disp: , Rfl:  ?  bisacodyl (DULCOLAX) 10 MG suppository, Place 1 suppository (10 mg total) rectally daily at 6 (six) AM., Disp: 30 suppository, Rfl: 0 ?  carvedilol (COREG) 3.125 MG tablet, Take 1 tablet (3.125 mg total) by mouth 2 (two) times daily with a meal., Disp: 60 tablet, Rfl: 0 ?  cloNIDine (CATAPRES) 0.2 MG tablet, Take 1 tablet (0.2 mg total) by mouth 2 (two) times daily., Disp: 60 tablet, Rfl: 0 ?  gabapentin (NEURONTIN) 100 MG capsule, Take 1 capsule (100 mg total) by mouth at bedtime. (Patient taking differently: Take 100 mg by mouth 2 (two) times daily.), Disp: 30 capsule, Rfl: 0 ?  hydrALAZINE (APRESOLINE) 25 MG tablet, Take 1 tablet (25 mg total) by mouth every 8 (eight) hours., Disp: 90 tablet, Rfl: 0 ?  Melatonin 3 MG TABS, Take 2 tablets (6 mg total) by mouth at bedtime. IS OVER THE COUNTER. USED FOR SLEEP (Patient taking differently: Take 15 mg by mouth at bedtime. IS OVER THE COUNTER. USED FOR SLEEP), Disp: , Rfl: 0 ?  nicotine polacrilex (NICORETTE) 2 MG gum, Take 2 mg by mouth as needed for smoking cessation., Disp: , Rfl:  ?  PARoxetine (PAXIL) 40 MG tablet, Take 0.5  tablets (20 mg total) by mouth daily., Disp: , Rfl:  ?  tamsulosin (FLOMAX) 0.4 MG CAPS capsule, Take 0.4 mg by mouth., Disp: , Rfl:  ?  vitamin B-12 (CYANOCOBALAMIN) 1000 MCG tablet, Take 1,000 mcg by mouth daily. , Disp: , Rfl:  ?  latanoprost (XALATAN) 0.005 % ophthalmic solution, Place 1 drop into both eyes at bedtime. (Patient not taking: Reported on 04/27/2022), Disp: , Rfl:  ?  polyethylene glycol (MIRALAX / GLYCOLAX) packet, Take 17 g by mouth daily. (Patient not taking: Reported on 04/27/2022), Disp: 30 each, Rfl: 0 ? ?Past Medical History: ?Past Medical History:  ?Diagnosis Date  ? Anemia   ? CKD (chronic kidney disease), stage III ([place])   ? Elevated alkaline phosphatase level   ? History of fracture of clavicle   ? History of fracture of rib   ? Hypertension   ? Pancytopenia ([place])   ? PTSD (post-traumatic stress disorder)   ? FOLLOWED AT [facility]  ? Thrombocytopenia ([place])   ? ? ?Tobacco Use: ?Social History  ? ?Tobacco Use  ?Smoking Status Former  ? Packs/day: 1.00  ? Years: 50.00  ? Pack years: 50.00  ? Types: Cigars, Cigarettes  ? Quit date: 05/20/2021  ? Years since quitting: 0.9  ?Smokeless Tobacco Never  ?Tobacco Comments  ? 66 PACK YR HX SMOKING SO HIGH RISK LUNG CANCER  ? ? ?Labs: ?Review Flowsheet   ? ?    ?  View : No data to display.  ?  ?  ?  ?  ?  ? ? ? ?Pulmonary Assessment Scores: ? Pulmonary Assessment Scores   ? ? [place] Name 04/27/22 1249  ?  ?  ?  ? ADL UCSD  ? ADL Phase Entry    ? SOB Score total 0    ? Rest 0    ? Walk 0    ? Stairs 0    ? Bath 0    ? Dress 0    ? Shop 0    ?  ? CAT Score  ? CAT Score 7    ?  ? mMRC Score  ? mMRC Score 2    ? ?  ?  ? ?  ?  ?UCSD: ?Self-administered rating of dyspnea associated with activities of daily living (ADLs) ?6-point scale (0 = "not at all" to 5 = "maximal or unable to do because of breathlessness")  ?Scoring Scores range from 0 to 120.  Minimally important difference is 5 units ? ?CAT: ?CAT can identify the health impairment of COPD patients and is  better correlated with disease progression.  ?CAT has a scoring range of zero to 40. The CAT score is classified into four groups of low (less than 10), medium (10 - 20), high (21-30) and very high (31-40) based on the impact level of disease on health status. A CAT score over 10 suggests significant symptoms.  A worsening CAT score could be explained by an exacerbation, poor medication adherence, poor inhaler technique, or progression of COPD or comorbid conditions.  ?CAT MCID is 2 points ? ?mMRC: ?mMRC (Modified Medical Research Council) Dyspnea Scale is used to assess the degree of baseline functional disability in patients of respiratory disease due to dyspnea. ?No minimal important difference is established. A decrease in score of 1 point or greater is considered a positive change.  ? ?Pulmonary Function Assessment: ? Pulmonary Function Assessment - 04/21/22 1021   ? ?  ? Breath  ? Shortness of Breath Yes;Limiting activity   ? ?  ?  ? ?  ? ? ?Exercise Target Goals: ?Exercise Program Goal: ?Individual exercise prescription set using results from initial 6 min walk test and THRR while considering  patient?s activity barriers and safety.  ? ?Exercise Prescription Goal: ?Initial exercise prescription builds to 30-45 minutes a day of aerobic activity, 2-3 days per week.  Home exercise guidelines will be given to patient during program as part of exercise prescription that the participant will acknowledge. ? ?Education: Aerobic Exercise: ?- Group verbal and visual presentation on the components of exercise prescription. Introduces F.I.T.T principle from ACSM for exercise prescriptions.  Reviews F.I.T.T. principles of aerobic exercise including progression. Written material given at graduation. ? ? ?Education: Resistance Exercise: ?- Group verbal and visual presentation on the components of exercise prescription. Introduces F.I.T.T principle from ACSM for exercise prescriptions  Reviews F.I.T.T. principles of  resistance exercise including progression. Written material given at graduation. ? ?  ?Education: Exercise & Equipment Safety: ?- Individual verbal instruction and demonstration of equipment use and safety with use of the equipment. ?Flowsheet Row Pulmonary Rehab from 04/21/2022 in [facility] Cardiac and Pulmonary Rehab  ?Date 04/21/22  ?Educator [facility]  ?Instruction Review Code 1- Verbalizes Understanding  ? ?  ? ? ?Education: Exercise Physiology & General Exercise Guidelines: ?- Group verbal and written instruction with models to review the exercise physiology of the cardiovascular system and associated critical values. Provides general exercise guidelines with specific  guidelines to those with heart or lung disease.  ? ? ?Education: Flexibility, Balance, Mind/Body Relaxation: ?- Group verbal and visual presentation with interactive activity on the components of exercise prescription. Introduces F.I.T.T principle from ACSM for exercise prescriptions. Reviews F.I.T.T. principles of flexibility and balance exercise training including progression. Also discusses the mind body connection.  Reviews various relaxation techniques to help reduce and manage stress (i.e. Deep breathing, progressive muscle relaxation, and visualization). Balance handout provided to take home. Written material given at graduation. ? ? ?Activity Barriers & Risk Stratification: ? Activity Barriers & Cardiac Risk Stratification - 04/27/22 1256   ? ?  ? Activity Barriers & Cardiac Risk Stratification  ? Activity Barriers Arthritis;History of Falls;Balance Concerns;Deconditioning;Muscular Weakness;Other (comment);Assistive Device   ? Comments Right foot drop? Getting evaluated at [facility]   ? ?  ?  ? ?  ? ? ?6 Minute Walk: ? 6 Minute Walk   ? ? [place] Name 04/27/22 1309  ?  ?  ?  ? 6 Minute Walk  ? Phase Initial    ? Distance 410 feet    ? Walk Time 4.6 minutes    ? # of Rest Breaks 1  Break 3:23- 5:02    ? MPH 1.01    ? METS 1.15    ? RPE 9    ? Perceived Dyspnea  0     ? VO2 Peak 4.03    ? Symptoms Yes (comment)    ? Comments Right foot drop? Bothersome    ? Resting HR 52 bpm    ? Resting BP 112/56    ? Resting Oxygen Saturation  96 %    ? Exercise Oxygen Saturation  duri

## 2022-04-28 NOTE — Telephone Encounter (Signed)
Called and LVM asking for patient to please return my call.  

## 2022-04-29 NOTE — Telephone Encounter (Signed)
Called and LVM asking for patient to please return my call.  

## 2022-04-29 NOTE — Telephone Encounter (Signed)
Caller returning call and states patient is not a patient of [facility]. Patient is a [place] and goes to [facility]. ?

## 2022-05-02 ENCOUNTER — Encounter: Payer: No Typology Code available for payment source | Admitting: *Deleted

## 2022-05-02 DIAGNOSIS — R911 Solitary pulmonary nodule: Secondary | ICD-10-CM | POA: Diagnosis not present

## 2022-05-02 NOTE — Progress Notes (Signed)
Daily Session Note ? ?Patient Details  ?Name: Gregory Hurley ?MRN: [id number] ?Date of Birth: February 08, 1946 ?Referring Provider:   ?Flowsheet Row Pulmonary Rehab from 04/27/2022 in [facility] Cardiac and Pulmonary Rehab  ?Referring Provider Terese Door, MD ([place])  ? ?  ? ? ?Encounter Date: 05/02/2022 ? ?Check In: ? Session Check In - 05/02/22 1031   ? ?  ? Check-In  ? Supervising physician immediately available to respond to emergencies See telemetry face sheet for immediately available ER MD   ? Location [facility]-Cardiac & Pulmonary Rehab   ? Staff Present Heath Lark, RN, BSN, [employer], MPA, RN;Kelly Amedeo Plenty, BS, ACSM CEP, Exercise Physiologist   ? Virtual Visit No   ? Medication changes reported     No   ? Fall or balance concerns reported    No   ? Warm-up and Cool-down Performed on first and last piece of equipment   ? Resistance Training Performed Yes   ? VAD Patient? No   ? PAD/SET Patient? No   ?  ? Pain Assessment  ? Currently in Pain? No/denies   ? ?  ?  ? ?  ? ? ? ? ? ?Social History  ? ?Tobacco Use  ?Smoking Status Former  ? Packs/day: 1.00  ? Years: 50.00  ? Pack years: 50.00  ? Types: Cigars, Cigarettes  ? Quit date: 05/20/2021  ? Years since quitting: 0.9  ?Smokeless Tobacco Never  ?Tobacco Comments  ? 65 PACK YR HX SMOKING SO HIGH RISK LUNG CANCER  ? ? ?Goals Met:  ?Proper associated with RPD/PD & O2 Sat ?Exercise tolerated well ?Personal goals reviewed ?No report of concerns or symptoms today ? ?Goals Unmet:  ?Not Applicable ? ?Comments:First full day of exercise!  Patient was oriented to gym and equipment including functions, settings, policies, and procedures.  Patient's individual exercise prescription and treatment plan were reviewed.  All starting workloads were established based on the results of the 6 minute walk test done at initial orientation visit.  The plan for exercise progression was also introduced and progression will be customized based on patient's performance and goals. ? ? ? ?Dr.  Emily Filbert is Medical Director for [place].  ?Dr. Ottie Glazier is Medical Director for [facility] Pulmonary Rehabilitation. ?

## 2022-05-04 ENCOUNTER — Encounter: Payer: Self-pay | Admitting: *Deleted

## 2022-05-04 DIAGNOSIS — R911 Solitary pulmonary nodule: Secondary | ICD-10-CM

## 2022-05-04 NOTE — Progress Notes (Signed)
Pulmonary Individual Treatment Plan ? ?Patient Details  ?Name: Gregory Hurley ?MRN: [id number] ?Date of Birth: November 14, 1946 ?Referring Provider:   ?Flowsheet Row Pulmonary Rehab from 04/27/2022 in [facility] Cardiac and Pulmonary Rehab  ?Referring Provider Terese Door, MD ([place])  ? ?  ? ? ?Initial Encounter Date:  ?Flowsheet Row Pulmonary Rehab from 04/27/2022 in [facility] Cardiac and Pulmonary Rehab  ?Date 04/27/22  ? ?  ? ? ?Visit Diagnosis: Lung nodule ? ?Patient's Home Medications on Admission: ? ?Current Outpatient Medications:  ?  acetaminophen (TYLENOL) 325 MG tablet, Take 1-2 tablets (325-650 mg total) by mouth every 4 (four) hours as needed for mild pain., Disp: , Rfl:  ?  amLODipine (NORVASC) 10 MG tablet, Take 1 tablet (10 mg total) by mouth daily., Disp: 30 tablet, Rfl: 0 ?  aspirin 81 MG chewable tablet, Chew by mouth daily., Disp: , Rfl:  ?  bisacodyl (DULCOLAX) 10 MG suppository, Place 1 suppository (10 mg total) rectally daily at 6 (six) AM., Disp: 30 suppository, Rfl: 0 ?  carvedilol (COREG) 3.125 MG tablet, Take 1 tablet (3.125 mg total) by mouth 2 (two) times daily with a meal., Disp: 60 tablet, Rfl: 0 ?  cloNIDine (CATAPRES) 0.2 MG tablet, Take 1 tablet (0.2 mg total) by mouth 2 (two) times daily., Disp: 60 tablet, Rfl: 0 ?  gabapentin (NEURONTIN) 100 MG capsule, Take 1 capsule (100 mg total) by mouth at bedtime. (Patient taking differently: Take 100 mg by mouth 2 (two) times daily.), Disp: 30 capsule, Rfl: 0 ?  hydrALAZINE (APRESOLINE) 25 MG tablet, Take 1 tablet (25 mg total) by mouth every 8 (eight) hours., Disp: 90 tablet, Rfl: 0 ?  latanoprost (XALATAN) 0.005 % ophthalmic solution, Place 1 drop into both eyes at bedtime. (Patient not taking: Reported on 04/27/2022), Disp: , Rfl:  ?  Melatonin 3 MG TABS, Take 2 tablets (6 mg total) by mouth at bedtime. IS OVER THE COUNTER. USED FOR SLEEP (Patient taking differently: Take 15 mg by mouth at bedtime. IS OVER THE COUNTER. USED FOR SLEEP), Disp: , Rfl: 0 ?   nicotine polacrilex (NICORETTE) 2 MG gum, Take 2 mg by mouth as needed for smoking cessation., Disp: , Rfl:  ?  PARoxetine (PAXIL) 40 MG tablet, Take 0.5 tablets (20 mg total) by mouth daily., Disp: , Rfl:  ?  polyethylene glycol (MIRALAX / GLYCOLAX) packet, Take 17 g by mouth daily. (Patient not taking: Reported on 04/27/2022), Disp: 30 each, Rfl: 0 ?  tamsulosin (FLOMAX) 0.4 MG CAPS capsule, Take 0.4 mg by mouth., Disp: , Rfl:  ?  vitamin B-12 (CYANOCOBALAMIN) 1000 MCG tablet, Take 1,000 mcg by mouth daily. , Disp: , Rfl:  ? ?Past Medical History: ?Past Medical History:  ?Diagnosis Date  ? Anemia   ? CKD (chronic kidney disease), stage III ([place])   ? Elevated alkaline phosphatase level   ? History of fracture of clavicle   ? History of fracture of rib   ? Hypertension   ? Pancytopenia ([place])   ? PTSD (post-traumatic stress disorder)   ? FOLLOWED AT [facility]  ? Thrombocytopenia ([place])   ? ? ?Tobacco Use: ?Social History  ? ?Tobacco Use  ?Smoking Status Former  ? Packs/day: 1.00  ? Years: 50.00  ? Pack years: 50.00  ? Types: Cigars, Cigarettes  ? Quit date: 05/20/2021  ? Years since quitting: 0.9  ?Smokeless Tobacco Never  ?Tobacco Comments  ? 6 PACK YR HX SMOKING SO HIGH RISK LUNG CANCER  ? ? ?Labs: ?Review Flowsheet   ? ?    ?  View : No data to display.  ?  ?  ?  ?  ?  ? ? ? ?Pulmonary Assessment Scores: ? Pulmonary Assessment Scores   ? ? [place] Name 04/27/22 1249  ?  ?  ?  ? ADL UCSD  ? ADL Phase Entry    ? SOB Score total 0    ? Rest 0    ? Walk 0    ? Stairs 0    ? Bath 0    ? Dress 0    ? Shop 0    ?  ? CAT Score  ? CAT Score 7    ?  ? mMRC Score  ? mMRC Score 2    ? ?  ?  ? ?  ?  ?UCSD: ?Self-administered rating of dyspnea associated with activities of daily living (ADLs) ?6-point scale (0 = "not at all" to 5 = "maximal or unable to do because of breathlessness")  ?Scoring Scores range from 0 to 120.  Minimally important difference is 5 units ? ?CAT: ?CAT can identify the health impairment of COPD patients and is  better correlated with disease progression.  ?CAT has a scoring range of zero to 40. The CAT score is classified into four groups of low (less than 10), medium (10 - 20), high (21-30) and very high (31-40) based on the impact level of disease on health status. A CAT score over 10 suggests significant symptoms.  A worsening CAT score could be explained by an exacerbation, poor medication adherence, poor inhaler technique, or progression of COPD or comorbid conditions.  ?CAT MCID is 2 points ? ?mMRC: ?mMRC (Modified Medical Research Council) Dyspnea Scale is used to assess the degree of baseline functional disability in patients of respiratory disease due to dyspnea. ?No minimal important difference is established. A decrease in score of 1 point or greater is considered a positive change.  ? ?Pulmonary Function Assessment: ? Pulmonary Function Assessment - 04/21/22 1021   ? ?  ? Breath  ? Shortness of Breath Yes;Limiting activity   ? ?  ?  ? ?  ? ? ?Exercise Target Goals: ?Exercise Program Goal: ?Individual exercise prescription set using results from initial 6 min walk test and THRR while considering  patient?s activity barriers and safety.  ? ?Exercise Prescription Goal: ?Initial exercise prescription builds to 30-45 minutes a day of aerobic activity, 2-3 days per week.  Home exercise guidelines will be given to patient during program as part of exercise prescription that the participant will acknowledge. ? ?Education: Aerobic Exercise: ?- Group verbal and visual presentation on the components of exercise prescription. Introduces F.I.T.T principle from ACSM for exercise prescriptions.  Reviews F.I.T.T. principles of aerobic exercise including progression. Written material given at graduation. ? ? ?Education: Resistance Exercise: ?- Group verbal and visual presentation on the components of exercise prescription. Introduces F.I.T.T principle from ACSM for exercise prescriptions  Reviews F.I.T.T. principles of  resistance exercise including progression. Written material given at graduation. ? ?  ?Education: Exercise & Equipment Safety: ?- Individual verbal instruction and demonstration of equipment use and safety with use of the equipment. ?Flowsheet Row Pulmonary Rehab from 04/21/2022 in [facility] Cardiac and Pulmonary Rehab  ?Date 04/21/22  ?Educator [facility]  ?Instruction Review Code 1- Verbalizes Understanding  ? ?  ? ? ?Education: Exercise Physiology & General Exercise Guidelines: ?- Group verbal and written instruction with models to review the exercise physiology of the cardiovascular system and associated critical values. Provides general exercise guidelines with specific  guidelines to those with heart or lung disease.  ? ? ?Education: Flexibility, Balance, Mind/Body Relaxation: ?- Group verbal and visual presentation with interactive activity on the components of exercise prescription. Introduces F.I.T.T principle from ACSM for exercise prescriptions. Reviews F.I.T.T. principles of flexibility and balance exercise training including progression. Also discusses the mind body connection.  Reviews various relaxation techniques to help reduce and manage stress (i.e. Deep breathing, progressive muscle relaxation, and visualization). Balance handout provided to take home. Written material given at graduation. ? ? ?Activity Barriers & Risk Stratification: ? Activity Barriers & Cardiac Risk Stratification - 04/27/22 1256   ? ?  ? Activity Barriers & Cardiac Risk Stratification  ? Activity Barriers Arthritis;History of Falls;Balance Concerns;Deconditioning;Muscular Weakness;Other (comment);Assistive Device   ? Comments Right foot drop? Getting evaluated at [facility]   ? ?  ?  ? ?  ? ? ?6 Minute Walk: ? 6 Minute Walk   ? ? [place] Name 04/27/22 1309  ?  ?  ?  ? 6 Minute Walk  ? Phase Initial    ? Distance 410 feet    ? Walk Time 4.6 minutes    ? # of Rest Breaks 1  Break 3:23- 5:02    ? MPH 1.01    ? METS 1.15    ? RPE 9    ? Perceived Dyspnea  0     ? VO2 Peak 4.03    ? Symptoms Yes (comment)    ? Comments Right foot drop? Bothersome    ? Resting HR 52 bpm    ? Resting BP 112/56    ? Resting Oxygen Saturation  96 %    ? Exercise Oxygen Saturation  duri

## 2022-05-04 NOTE — Progress Notes (Signed)
Daily Session Note ? ?Patient Details  ?Name: Gregory Hurley ?MRN: [id number] ?Date of Birth: 11-01-1946 ?Referring Provider:   ?Flowsheet Row Pulmonary Rehab from 04/27/2022 in [facility] Cardiac [facility] Pulmonary Rehab  ?Referring Provider Terese Door, MD ([place])  ? ?  ? ? ?Encounter Date: 05/04/2022 ? ?Check In: ? Session Check In - 05/04/22 0958   ? ?  ? Check-In  ? Supervising physician immediately available to respond to emergencies See telemetry face sheet for immediately available ER MD   ? Location [facility]-Cardiac & Pulmonary Rehab   ? Staff Present Birdie Sons, MPA, RN;Melissa [place], RDN, LDN;Joseph [place], RCP,RRT,BSRT   ? Virtual Visit No   ? Medication changes reported     No   ? Fall or balance concerns reported    No   ? Tobacco Cessation No Change   ? Warm-up and Cool-down Performed on first and last piece of equipment   ? Resistance Training Performed Yes   ? VAD Patient? No   ? PAD/SET Patient? No   ?  ? Pain Assessment  ? Currently in Pain? No/denies   ? ?  ?  ? ?  ? ? ? ? ? ?Social History  ? ?Tobacco Use  ?Smoking Status Former  ? Packs/day: 1.00  ? Years: 50.00  ? Pack years: 50.00  ? Types: Cigars, Cigarettes  ? Quit date: 05/20/2021  ? Years since quitting: 0.9  ?Smokeless Tobacco Never  ?Tobacco Comments  ? 63 PACK YR HX SMOKING SO HIGH RISK LUNG CANCER  ? ? ?Goals Met:  ?Independence with exercise equipment ?Exercise tolerated well ?No report of concerns or symptoms today ?Strength training completed today ? ?Goals Unmet:  ?Not Applicable ? ?Comments: Pt able to follow exercise prescription today without complaint.  Will continue to monitor for progression. ? ? ? ?Dr. Emily Filbert is Medical Director for [place].  ?Dr. Ottie Glazier is Medical Director for [facility] Pulmonary Rehabilitation. ?

## 2022-05-05 ENCOUNTER — Encounter: Payer: No Typology Code available for payment source | Admitting: *Deleted

## 2022-05-05 DIAGNOSIS — R911 Solitary pulmonary nodule: Secondary | ICD-10-CM | POA: Diagnosis not present

## 2022-05-05 NOTE — Progress Notes (Signed)
Daily Session Note  Patient Details  Name: Gregory Hurley MRN: [id number] Date of Birth: 05-30-1946 Referring Provider:   Flowsheet Row Pulmonary Rehab from 04/27/2022 in [facility] Cardiac and Pulmonary Rehab  Referring Provider Terese Door, MD ([place])       Encounter Date: 05/05/2022  Check In:  Session Check In - 05/05/22 1113       Check-In   Supervising physician immediately available to respond to emergencies See telemetry face sheet for immediately available ER MD    Location [facility]-Cardiac & Pulmonary Rehab    Staff Present Heath Lark, RN, BSN, CCRP;Kelly Bollinger, MPA, RN;Melissa [place], RDN, LDN;Joseph [place], [place]    Virtual Visit No    Medication changes reported     No    Tobacco Cessation No Change    Current number of cigarettes/nicotine per day     0    Warm-up and Cool-down Performed on first and last piece of equipment    Resistance Training Performed Yes    VAD Patient? No    PAD/SET Patient? No      Pain Assessment   Currently in Pain? No/denies                Social History   Tobacco Use  Smoking Status Former   Packs/day: 1.00   Years: 50.00   Pack years: 50.00   Types: Cigars, Cigarettes   Quit date: 05/20/2021   Years since quitting: 0.9  Smokeless Tobacco Never  Tobacco Comments   50 PACK YR HX SMOKING SO HIGH RISK LUNG CANCER    Goals Met:  Proper associated with RPD/PD & O2 Sat Independence with exercise equipment Exercise tolerated well No report of concerns or symptoms today  Goals Unmet:  Not Applicable  Comments: Pt able to follow exercise prescription today without complaint.  Will continue to monitor for progression.    Dr. Emily Filbert is Medical Director for [place].  Dr. Ottie Glazier is Medical Director for [facility] Pulmonary Rehabilitation.

## 2022-05-09 ENCOUNTER — Encounter: Payer: No Typology Code available for payment source | Admitting: *Deleted

## 2022-05-09 DIAGNOSIS — R911 Solitary pulmonary nodule: Secondary | ICD-10-CM

## 2022-05-09 NOTE — Progress Notes (Signed)
Daily Session Note  Patient Details  Name: Gregory Hurley MRN: [id number] Date of Birth: [birth year] Referring Provider:   Flowsheet Row Pulmonary Rehab from 04/27/2022 in [facility] Cardiac and Pulmonary Rehab  Referring Provider Terese Door, MD ([place])       Encounter Date: 05/09/2022  Check In:  Session Check In - 05/09/22 1111       Check-In   Supervising physician immediately available to respond to emergencies See telemetry face sheet for immediately available ER MD    Location [facility]-Cardiac & Pulmonary Rehab    Staff Present Heath Lark, RN, BSN, CCRP;Kelly Bollinger, MPA, [place], BS, ACSM CEP, Exercise Physiologist    Virtual Visit No    Medication changes reported     No    Fall or balance concerns reported    No    Warm-up and Cool-down Performed on first and last piece of equipment    Resistance Training Performed Yes    VAD Patient? No    PAD/SET Patient? No      Pain Assessment   Currently in Pain? No/denies                Social History   Tobacco Use  Smoking Status Former   Packs/day: 1.00   Years: 50.00   Pack years: 50.00   Types: Cigars, Cigarettes   Quit date: 05/20/2021   Years since quitting: 0.9  Smokeless Tobacco Never  Tobacco Comments   73 PACK YR HX SMOKING SO HIGH RISK LUNG CANCER    Goals Met:  Proper associated with RPD/PD & O2 Sat Independence with exercise equipment Exercise tolerated well No report of concerns or symptoms today  Goals Unmet:  Not Applicable  Comments: Pt able to follow exercise prescription today without complaint.  Will continue to monitor for progression.    Dr. Emily Filbert is Medical Director for [place].  Dr. Ottie Glazier is Medical Director for [facility] Pulmonary Rehabilitation.

## 2022-05-11 DIAGNOSIS — R911 Solitary pulmonary nodule: Secondary | ICD-10-CM

## 2022-05-11 NOTE — Progress Notes (Signed)
Daily Session Note  Patient Details  Name: Gregory Hurley MRN: [id number] Date of Birth: 06-20-1946 Referring Provider:   Flowsheet Row Pulmonary Rehab from 04/27/2022 in [facility] Cardiac and Pulmonary Rehab  Referring Provider Terese Door, MD ([place])       Encounter Date: 05/11/2022  Check In:  Session Check In - 05/11/22 0944       Check-In   Supervising physician immediately available to respond to emergencies See telemetry face sheet for immediately available ER MD    Location [facility]-Cardiac & Pulmonary Rehab    Staff Present Birdie Sons, MPA, RN;Jessica [place], MA, RCEP, CCRP, CCET;Joseph [place], [place]    Virtual Visit No    Medication changes reported     No    Fall or balance concerns reported    No    Tobacco Cessation No Change    Warm-up and Cool-down Performed on first and last piece of equipment    Resistance Training Performed Yes    VAD Patient? No    PAD/SET Patient? No      Pain Assessment   Currently in Pain? No/denies                Social History   Tobacco Use  Smoking Status Former   Packs/day: 1.00   Years: 50.00   Pack years: 50.00   Types: Cigars, Cigarettes   Quit date: 05/20/2021   Years since quitting: 0.9  Smokeless Tobacco Never  Tobacco Comments   68 PACK YR HX SMOKING SO HIGH RISK LUNG CANCER    Goals Met:  Independence with exercise equipment Exercise tolerated well No report of concerns or symptoms today Strength training completed today  Goals Unmet:  Not Applicable  Comments: Pt able to follow exercise prescription today without complaint.  Will continue to monitor for progression.    Dr. Emily Filbert is Medical Director for [place].  Dr. Ottie Glazier is Medical Director for [facility] Pulmonary Rehabilitation.

## 2022-05-12 DIAGNOSIS — R911 Solitary pulmonary nodule: Secondary | ICD-10-CM | POA: Diagnosis not present

## 2022-05-12 NOTE — Progress Notes (Signed)
Daily Session Note  Patient Details  Name: Earlin Sweeden MRN: [id number] Date of Birth: 10-18-1946 Referring Provider:   Flowsheet Row Pulmonary Rehab from 04/27/2022 in [facility] Cardiac and Pulmonary Rehab  Referring Provider Terese Door, MD ([place])       Encounter Date: 05/12/2022  Check In:  Session Check In - 05/12/22 1108       Check-In   Supervising physician immediately available to respond to emergencies See telemetry face sheet for immediately available ER MD    Location [facility]-Cardiac & Pulmonary Rehab    Staff Present Birdie Sons, MPA, RN;Laureen Owens [place]ark, BS, RRT, CPFT;Joseph [place], [place]    Virtual Visit No    Medication changes reported     No    Fall or balance concerns reported    No    Tobacco Cessation No Change    Warm-up and Cool-down Performed on first and last piece of equipment    Resistance Training Performed Yes    VAD Patient? No    PAD/SET Patient? No      Pain Assessment   Currently in Pain? No/denies                Social History   Tobacco Use  Smoking Status Former   Packs/day: 1.00   Years: 50.00   Pack years: 50.00   Types: Cigars, Cigarettes   Quit date: 05/20/2021   Years since quitting: 0.9  Smokeless Tobacco Never  Tobacco Comments   66 PACK YR HX SMOKING SO HIGH RISK LUNG CANCER    Goals Met:  Independence with exercise equipment Exercise tolerated well No report of concerns or symptoms today Strength training completed today  Goals Unmet:  Not Applicable  Comments: Pt able to follow exercise prescription today without complaint.  Will continue to monitor for progression.    Dr. Emily Filbert is Medical Director for [place].  Dr. Ottie Glazier is Medical Director for [facility] Pulmonary Rehabilitation.

## 2022-05-30 ENCOUNTER — Encounter: Payer: Self-pay | Admitting: *Deleted

## 2022-05-30 DIAGNOSIS — R911 Solitary pulmonary nodule: Secondary | ICD-10-CM

## 2022-06-01 ENCOUNTER — Encounter: Payer: Self-pay | Admitting: *Deleted

## 2022-06-01 DIAGNOSIS — R911 Solitary pulmonary nodule: Secondary | ICD-10-CM

## 2022-06-01 NOTE — Progress Notes (Signed)
Pulmonary Individual Treatment Plan  Patient Details  Name: Gregory Hurley MRN: [id number] Date of Birth: 20-Dec-1945 Referring Provider:   Flowsheet Row Pulmonary Rehab from 04/27/2022 in [facility] Cardiac and Pulmonary Rehab  Referring Provider Terese Door, MD ([place])       Initial Encounter Date:  Flowsheet Row Pulmonary Rehab from 04/27/2022 in [facility] Cardiac and Pulmonary Rehab  Date 04/27/22       Visit Diagnosis: Lung nodule  Patient's Home Medications on Admission:  Current Outpatient Medications:    acetaminophen (TYLENOL) 325 MG tablet, Take 1-2 tablets (325-650 mg total) by mouth every 4 (four) hours as needed for mild pain., Disp: , Rfl:    amLODipine (NORVASC) 10 MG tablet, Take 1 tablet (10 mg total) by mouth daily., Disp: 30 tablet, Rfl: 0   aspirin 81 MG chewable tablet, Chew by mouth daily., Disp: , Rfl:    bisacodyl (DULCOLAX) 10 MG suppository, Place 1 suppository (10 mg total) rectally daily at 6 (six) AM., Disp: 30 suppository, Rfl: 0   carvedilol (COREG) 3.125 MG tablet, Take 1 tablet (3.125 mg total) by mouth 2 (two) times daily with a meal., Disp: 60 tablet, Rfl: 0   cloNIDine (CATAPRES) 0.2 MG tablet, Take 1 tablet (0.2 mg total) by mouth 2 (two) times daily., Disp: 60 tablet, Rfl: 0   gabapentin (NEURONTIN) 100 MG capsule, Take 1 capsule (100 mg total) by mouth at bedtime. (Patient taking differently: Take 100 mg by mouth 2 (two) times daily.), Disp: 30 capsule, Rfl: 0   hydrALAZINE (APRESOLINE) 25 MG tablet, Take 1 tablet (25 mg total) by mouth every 8 (eight) hours., Disp: 90 tablet, Rfl: 0   latanoprost (XALATAN) 0.005 % ophthalmic solution, Place 1 drop into both eyes at bedtime. (Patient not taking: Reported on 04/27/2022), Disp: , Rfl:    Melatonin 3 MG TABS, Take 2 tablets (6 mg total) by mouth at bedtime. IS OVER THE COUNTER. USED FOR SLEEP (Patient taking differently: Take 15 mg by mouth at bedtime. IS OVER THE COUNTER. USED FOR SLEEP), Disp: , Rfl: 0    nicotine polacrilex (NICORETTE) 2 MG gum, Take 2 mg by mouth as needed for smoking cessation., Disp: , Rfl:    PARoxetine (PAXIL) 40 MG tablet, Take 0.5 tablets (20 mg total) by mouth daily., Disp: , Rfl:    polyethylene glycol (MIRALAX / GLYCOLAX) packet, Take 17 g by mouth daily. (Patient not taking: Reported on 04/27/2022), Disp: 30 each, Rfl: 0   tamsulosin (FLOMAX) 0.4 MG CAPS capsule, Take 0.4 mg by mouth., Disp: , Rfl:    vitamin B-12 (CYANOCOBALAMIN) 1000 MCG tablet, Take 1,000 mcg by mouth daily. , Disp: , Rfl:   Past Medical History: Past Medical History:  Diagnosis Date   Anemia    CKD (chronic kidney disease), stage III (HCC)    Elevated alkaline phosphatase level    History of fracture of clavicle    History of fracture of rib    Hypertension    Pancytopenia (HCC)    PTSD (post-traumatic stress disorder)    [facility]   Thrombocytopenia (HCC)     Tobacco Use: Social History   Tobacco Use  Smoking Status Former   Packs/day: 1.00   Years: 50.00   Total pack years: 50.00   Types: Cigars, Cigarettes   Quit date: 05/20/2021   Years since quitting: 1.0  Smokeless Tobacco Never  Tobacco Comments   68 PACK YR HX SMOKING SO HIGH RISK LUNG CANCER    Labs: Review  Flowsheet        No data to display           Pulmonary Assessment Scores:  Pulmonary Assessment Scores     Row Name 04/27/22 1249         ADL UCSD   ADL Phase Entry     SOB Score total 0     Rest 0     Walk 0     Stairs 0     Bath 0     Dress 0     Shop 0       CAT Score   CAT Score 7       mMRC Score   mMRC Score 2              UCSD: Self-administered rating of dyspnea associated with activities of daily living (ADLs) 6-point scale (0 = "not at all" to 5 = "maximal or unable to do because of breathlessness")  Scoring Scores range from 0 to 120.  Minimally important difference is 5 units  CAT: CAT can identify the health impairment of COPD patients and is better  correlated with disease progression.  CAT has a scoring range of zero to 40. The CAT score is classified into four groups of low (less than 10), medium (10 - 20), high (21-30) and very high (31-40) based on the impact level of disease on health status. A CAT score over 10 suggests significant symptoms.  A worsening CAT score could be explained by an exacerbation, poor medication adherence, poor inhaler technique, or progression of COPD or comorbid conditions.  CAT MCID is 2 points  mMRC: mMRC (Modified Medical Research Council) Dyspnea Scale is used to assess the degree of baseline functional disability in patients of respiratory disease due to dyspnea. No minimal important difference is established. A decrease in score of 1 point or greater is considered a positive change.   Pulmonary Function Assessment:  Pulmonary Function Assessment - 04/21/22 1021       Breath   Shortness of Breath Yes;Limiting activity             Exercise Target Goals: Exercise Program Goal: Individual exercise prescription set using results from initial 6 min walk test and THRR while considering  patient's activity barriers and safety.   Exercise Prescription Goal: Initial exercise prescription builds to 30-45 minutes a day of aerobic activity, 2-3 days per week.  Home exercise guidelines will be given to patient during program as part of exercise prescription that the participant will acknowledge.  Education: Aerobic Exercise: - Group verbal and visual presentation on the components of exercise prescription. Introduces F.I.T.T principle from ACSM for exercise prescriptions.  Reviews F.I.T.T. principles of aerobic exercise including progression. Written material given at graduation.   Education: Resistance Exercise: - Group verbal and visual presentation on the components of exercise prescription. Introduces F.I.T.T principle from ACSM for exercise prescriptions  Reviews F.I.T.T. principles of resistance  exercise including progression. Written material given at graduation.    Education: Exercise & Equipment Safety: - Individual verbal instruction and demonstration of equipment use and safety with use of the equipment. Flowsheet Row Pulmonary Rehab from 05/11/2022 in [facility] Cardiac and Pulmonary Rehab  Date 04/21/22  Educator [facility]  Instruction Review Code 1- Verbalizes Understanding       Education: Exercise Physiology & General Exercise Guidelines: - Group verbal and written instruction with models to review the exercise physiology of the cardiovascular system and associated critical values. Provides general exercise guidelines  with specific guidelines to those with heart or lung disease.    Education: Flexibility, Balance, Mind/Body Relaxation: - Group verbal and visual presentation with interactive activity on the components of exercise prescription. Introduces F.I.T.T principle from ACSM for exercise prescriptions. Reviews F.I.T.T. principles of flexibility and balance exercise training including progression. Also discusses the mind body connection.  Reviews various relaxation techniques to help reduce and manage stress (i.e. Deep breathing, progressive muscle relaxation, and visualization). Balance handout provided to take home. Written material given at graduation. Flowsheet Row Pulmonary Rehab from 05/11/2022 in [facility] Cardiac and Pulmonary Rehab  Date 05/04/22  Educator [facility]  Instruction Review Code 1- Verbalizes Understanding       Activity Barriers & Risk Stratification:  Activity Barriers & Cardiac Risk Stratification - 04/27/22 1256       Activity Barriers & Cardiac Risk Stratification   Activity Barriers Arthritis;History of Falls;Balance Concerns;Deconditioning;Muscular Weakness;Other (comment);Assistive Device    Comments Right foot drop? Getting evaluated at [facility]             6 Minute Walk:  6 Minute Walk     Row Name 04/27/22 1309         6 Minute Walk   Phase  Initial     Distance 410 feet     Walk Time 4.6 minutes     # of Rest Breaks 1  Break 3:23- 5:02     MPH 1.01     METS 1.15     RPE 9     Perceived Dyspnea  0     VO2 Peak 4.03     Symptoms Yes (comment)     Comments Right foot drop? Bothersome     Resting HR 52 bpm     Resting BP 112/56     Resting Oxygen Saturation  96 %     Exercise Oxygen Saturation  during 6 min walk 91 %     Max Ex. HR 70 bpm     Max Ex. BP 126/54     2 Minute Post BP 122/58       Interval HR   1 Minute HR 56     2 Minute HR 57     3 Minute HR 64     4 Minute HR 60     5 Minute HR 68     6 Minute HR 70     2 Minute Post HR 54     Interval Heart Rate? Yes       Interval Oxygen   Interval Oxygen? Yes     Baseline Oxygen Saturation % 96 %     1 Minute Oxygen Saturation % 94 %     1 Minute Liters of Oxygen 0 L  RA     2 Minute Oxygen Saturation % 91 %     2 Minute Liters of Oxygen 0 L     3 Minute Oxygen Saturation % 94 %     3 Minute Liters of Oxygen 0 L     4 Minute Oxygen Saturation % 93 %     4 Minute Liters of Oxygen 0 L     5 Minute Oxygen Saturation % 91 %     5 Minute Liters of Oxygen 0 L     6 Minute Oxygen Saturation % 98 %     6 Minute Liters of Oxygen 0 L     2 Minute Post Oxygen Saturation % 96 %     2 Minute Post  Liters of Oxygen 0 L             Oxygen Initial Assessment:  Oxygen Initial Assessment - 04/27/22 1249       Home Oxygen   Home Oxygen Device None    Sleep Oxygen Prescription None    Home Exercise Oxygen Prescription None    Home Resting Oxygen Prescription None    Compliance with Home Oxygen Use Yes      Initial 6 min Walk   Oxygen Used None      Program Oxygen Prescription   Program Oxygen Prescription None      Intervention   Short Term Goals To learn and demonstrate proper pursed lip breathing techniques or other breathing techniques. ;To learn and understand importance of monitoring SPO2 with pulse oximeter and demonstrate accurate use of the pulse  oximeter.;To learn and understand importance of maintaining oxygen saturations>88%    Long  Term Goals Verbalizes importance of monitoring SPO2 with pulse oximeter and return demonstration;Maintenance of O2 saturations>88%;Exhibits proper breathing techniques, such as pursed lip breathing or other method taught during program session             Oxygen Re-Evaluation:  Oxygen Re-Evaluation     Row Name 05/02/22 1034             Program Oxygen Prescription   Program Oxygen Prescription None         Home Oxygen   Home Oxygen Device None       Sleep Oxygen Prescription None       Home Exercise Oxygen Prescription None       Home Resting Oxygen Prescription None       Compliance with Home Oxygen Use Yes         Goals/Expected Outcomes   Short Term Goals To learn and demonstrate proper pursed lip breathing techniques or other breathing techniques.        Long  Term Goals Exhibits proper breathing techniques, such as pursed lip breathing or other method taught during program session       Comments Reviewed PLB technique with pt.  Talked about how it works and it's importance in maintaining their exercise saturations.       Goals/Expected Outcomes Short: Become more profiecient at using PLB.   Long: Become independent at using PLB.                Oxygen Discharge (Final Oxygen Re-Evaluation):  Oxygen Re-Evaluation - 05/02/22 1034       Program Oxygen Prescription   Program Oxygen Prescription None      Home Oxygen   Home Oxygen Device None    Sleep Oxygen Prescription None    Home Exercise Oxygen Prescription None    Home Resting Oxygen Prescription None    Compliance with Home Oxygen Use Yes      Goals/Expected Outcomes   Short Term Goals To learn and demonstrate proper pursed lip breathing techniques or other breathing techniques.     Long  Term Goals Exhibits proper breathing techniques, such as pursed lip breathing or other method taught during program session     Comments Reviewed PLB technique with pt.  Talked about how it works and it's importance in maintaining their exercise saturations.    Goals/Expected Outcomes Short: Become more profiecient at using PLB.   Long: Become independent at using PLB.             Initial Exercise Prescription:  Initial Exercise Prescription - 04/27/22 1200  Date of Initial Exercise RX and Referring Provider   Date 04/27/22    Referring Provider Terese Door, MD ([facility])      Oxygen   Maintain Oxygen Saturation 88% or higher      Recumbant Bike   Level 1    RPM 60    Watts 8    Minutes 15    METs 1      NuStep   Level 1    SPM 80    Minutes 15    METs 1      REL-XR   Level 1    Speed 50    Minutes 15    METs 1      Biostep-RELP   Level 1    SPM 50    Minutes 15    METs 1      Track   Laps 3   as tolerated with right leg   Minutes 15    METs 1      Prescription Details   Frequency (times per week) 3    Duration Progress to 30 minutes of continuous aerobic without signs/symptoms of physical distress      Intensity   THRR 40-80% of Max Heartrate 88 - 126    Ratings of Perceived Exertion 11-13    Perceived Dyspnea 0-4      Progression   Progression Continue to progress workloads to maintain intensity without signs/symptoms of physical distress.      Resistance Training   Training Prescription Yes    Weight 2 lb    Reps 10-15             Perform Capillary Blood Glucose checks as needed.  Exercise Prescription Changes:   Exercise Prescription Changes     Row Name 04/27/22 1200 05/02/22 1300 05/17/22 1600         Response to Exercise   Blood Pressure (Admit) 112/56 142/82 140/64     Blood Pressure (Exercise) 126/54 -- --     Blood Pressure (Exit) 122/58 138/82 128/70     Heart Rate (Admit) 52 bpm 53 bpm 56 bpm     Heart Rate (Exercise) 70 bpm 67 bpm 64 bpm     Heart Rate (Exit) 50 bpm 63 bpm 55 bpm     Oxygen Saturation (Admit) 96 % 98 % 98 %      Oxygen Saturation (Exercise) 91 % 98 % 97 %     Oxygen Saturation (Exit) 96 % 93 % 97 %     Rating of Perceived Exertion (Exercise) '9 13 14     '$ Perceived Dyspnea (Exercise) 0 -- --     Symptoms Right foot drop?- Bothersome none none     Comments walk test results first full day of exercise last day before lung surgery     Duration -- Progress to 30 minutes of  aerobic without signs/symptoms of physical distress Progress to 30 minutes of  aerobic without signs/symptoms of physical distress     Intensity -- THRR unchanged THRR unchanged       Progression   Progression -- Continue to progress workloads to maintain intensity without signs/symptoms of physical distress. Continue to progress workloads to maintain intensity without signs/symptoms of physical distress.     Average METs -- -- 2.66       Resistance Training   Training Prescription -- Yes Yes     Weight -- 2 lb 3 lb     Reps -- 10-15 10-15  Interval Training   Interval Training -- No No       NuStep   Level -- 1 2     Minutes -- 15 30     METs -- -- 3.6       REL-XR   Level -- 1 --     Minutes -- 15 --       Biostep-RELP   Level -- -- 2     Minutes -- -- 30     METs -- -- 2       Oxygen   Maintain Oxygen Saturation -- 88% or higher --              Exercise Comments:   Exercise Comments     Row Name 05/02/22 1033           Exercise Comments First full day of exercise!  Patient was oriented to gym and equipment including functions, settings, policies, and procedures.  Patient's individual exercise prescription and treatment plan were reviewed.  All starting workloads were established based on the results of the 6 minute walk test done at initial orientation visit.  The plan for exercise progression was also introduced and progression will be customized based on patient's performance and goals.                Exercise Goals and Review:   Exercise Goals     Row Name 04/27/22 1304              Exercise Goals   Increase Physical Activity Yes       Intervention Provide advice, education, support and counseling about physical activity/exercise needs.;Develop an individualized exercise prescription for aerobic and resistive training based on initial evaluation findings, risk stratification, comorbidities and participant's personal goals.       Expected Outcomes Short Term: Attend rehab on a regular basis to increase amount of physical activity.;Long Term: Add in home exercise to make exercise part of routine and to increase amount of physical activity.;Long Term: Exercising regularly at least 3-5 days a week.       Increase Strength and Stamina Yes       Intervention Provide advice, education, support and counseling about physical activity/exercise needs.;Develop an individualized exercise prescription for aerobic and resistive training based on initial evaluation findings, risk stratification, comorbidities and participant's personal goals.       Expected Outcomes Short Term: Increase workloads from initial exercise prescription for resistance, speed, and METs.;Short Term: Perform resistance training exercises routinely during rehab and add in resistance training at home;Long Term: Improve cardiorespiratory fitness, muscular endurance and strength as measured by increased METs and functional capacity (6MWT)       Able to understand and use rate of perceived exertion (RPE) scale Yes       Intervention Provide education and explanation on how to use RPE scale       Expected Outcomes Short Term: Able to use RPE daily in rehab to express subjective intensity level;Long Term:  Able to use RPE to guide intensity level when exercising independently       Able to understand and use Dyspnea scale Yes       Intervention Provide education and explanation on how to use Dyspnea scale       Expected Outcomes Short Term: Able to use Dyspnea scale daily in rehab to express subjective sense of shortness of  breath during exertion;Long Term: Able to use Dyspnea scale to guide intensity level when exercising independently  Knowledge and understanding of Target Heart Rate Range (THRR) Yes       Intervention Provide education and explanation of THRR including how the numbers were predicted and where they are located for reference       Expected Outcomes Short Term: Able to state/look up THRR;Long Term: Able to use THRR to govern intensity when exercising independently;Short Term: Able to use daily as guideline for intensity in rehab       Able to check pulse independently Yes       Intervention Provide education and demonstration on how to check pulse in carotid and radial arteries.;Review the importance of being able to check your own pulse for safety during independent exercise       Expected Outcomes Short Term: Able to explain why pulse checking is important during independent exercise;Long Term: Able to check pulse independently and accurately       Understanding of Exercise Prescription Yes       Intervention Provide education, explanation, and written materials on patient's individual exercise prescription       Expected Outcomes Short Term: Able to explain program exercise prescription;Long Term: Able to explain home exercise prescription to exercise independently                Exercise Goals Re-Evaluation :  Exercise Goals Re-Evaluation     Row Name 05/02/22 1033 05/17/22 1641 05/30/22 1523         Exercise Goal Re-Evaluation   Exercise Goals Review Able to understand and use rate of perceived exertion (RPE) scale;Able to understand and use Dyspnea scale;Knowledge and understanding of Target Heart Rate Range (THRR);Understanding of Exercise Prescription Increase Physical Activity;Increase Strength and Stamina;Understanding of Exercise Prescription --     Comments Reviewed RPE and dyspnea scales, THR and program prescription with pt today.  Pt voiced understanding and was given a  copy of goals to take home. Taevyn is off to a good start with rehab. He was only able to complete a couple week's worth of exercise sessions due to having lung surgery. Staff placed him on a medical hold and will hopefully maintain enough strength to resume our program afterwards. We will continue to follow up with patient. Out since last review     Expected Outcomes Short: Use RPE daily to regulate intensity. Long: Follow program prescription in THR. Short: Get cleared to resume exercise Long: Continue to increase overall strength and stamina --              Discharge Exercise Prescription (Final Exercise Prescription Changes):  Exercise Prescription Changes - 05/17/22 1600       Response to Exercise   Blood Pressure (Admit) 140/64    Blood Pressure (Exit) 128/70    Heart Rate (Admit) 56 bpm    Heart Rate (Exercise) 64 bpm    Heart Rate (Exit) 55 bpm    Oxygen Saturation (Admit) 98 %    Oxygen Saturation (Exercise) 97 %    Oxygen Saturation (Exit) 97 %    Rating of Perceived Exertion (Exercise) 14    Symptoms none    Comments last day before lung surgery    Duration Progress to 30 minutes of  aerobic without signs/symptoms of physical distress    Intensity THRR unchanged      Progression   Progression Continue to progress workloads to maintain intensity without signs/symptoms of physical distress.    Average METs 2.66      Resistance Training   Training Prescription Yes  Weight 3 lb    Reps 10-15      Interval Training   Interval Training No      NuStep   Level 2    Minutes 30    METs 3.6      Biostep-RELP   Level 2    Minutes 30    METs 2             Nutrition:  Target Goals: Understanding of nutrition guidelines, daily intake of sodium '1500mg'$ , cholesterol '200mg'$ , calories 30% from fat and 7% or less from saturated fats, daily to have 5 or more servings of fruits and vegetables.  Education: All About Nutrition: -Group instruction provided by verbal,  written material, interactive activities, discussions, models, and posters to present general guidelines for heart healthy nutrition including fat, fiber, MyPlate, the role of sodium in heart healthy nutrition, utilization of the nutrition label, and utilization of this knowledge for meal planning. Follow up email sent as well. Written material given at graduation. Flowsheet Row Pulmonary Rehab from 05/11/2022 in [facility] Cardiac and Pulmonary Rehab  Date 05/11/22  Educator [facility]  Instruction Review Code 1- Verbalizes Understanding       Biometrics:  Pre Biometrics - 04/27/22 1256       Pre Biometrics   Height '5\' 10"'$  (1.778 m)    Weight 159 lb 8 oz (72.3 kg)    BMI (Calculated) 22.89    Single Leg Stand 0 seconds              Nutrition Therapy Plan and Nutrition Goals:  Nutrition Therapy & Goals - 05/04/22 1038       Personal Nutrition Goals   Nutrition Goal Would not like to talk about nutrition at this time. Will continue to follow up.             Nutrition Assessments:  MEDIFICTS Score Key: ?70 Need to make dietary changes  40-70 Heart Healthy Diet ? 40 Therapeutic Level Cholesterol Diet  Flowsheet Row Pulmonary Rehab from 04/27/2022 in [facility] Cardiac and Pulmonary Rehab  Picture Your Plate Total Score on Admission 37      Picture Your Plate Scores: <56 Unhealthy dietary pattern with much room for improvement. 41-50 Dietary pattern unlikely to meet recommendations for good health and room for improvement. 51-60 More healthful dietary pattern, with some room for improvement.  >60 Healthy dietary pattern, although there may be some specific behaviors that could be improved.   Nutrition Goals Re-Evaluation:   Nutrition Goals Discharge (Final Nutrition Goals Re-Evaluation):   Psychosocial: Target Goals: Acknowledge presence or absence of significant depression and/or stress, maximize coping skills, provide positive support system. Participant is able to verbalize  types and ability to use techniques and skills needed for reducing stress and depression.   Education: Stress, Anxiety, and Depression - Group verbal and visual presentation to define topics covered.  Reviews how body is impacted by stress, anxiety, and depression.  Also discusses healthy ways to reduce stress and to treat/manage anxiety and depression.  Written material given at graduation.   Education: Sleep Hygiene -Provides group verbal and written instruction about how sleep can affect your health.  Define sleep hygiene, discuss sleep cycles and impact of sleep habits. Review good sleep hygiene tips.    Initial Review & Psychosocial Screening:  Initial Psych Review & Screening - 04/21/22 1022       Initial Review   Current issues with Current Psychotropic Meds;History of Depression      Family Dynamics  Good Support System? Yes    Comments Maxey lives with his brother and can rely on his daughter for support. Patient states that he has PTSD. Loud noises or dropping of something may set him off.      Barriers   Psychosocial barriers to participate in program The patient should benefit from training in stress management and relaxation.      Screening Interventions   Interventions Provide feedback about the scores to participant;Encouraged to exercise;To provide support and resources with identified psychosocial needs    Expected Outcomes Short Term goal: Utilizing psychosocial counselor, staff and physician to assist with identification of specific Stressors or current issues interfering with healing process. Setting desired goal for each stressor or current issue identified.;Long Term Goal: Stressors or current issues are controlled or eliminated.;Short Term goal: Identification and review with participant of any Quality of Life or Depression concerns found by scoring the questionnaire.;Long Term goal: The participant improves quality of Life and PHQ9 Scores as seen by post scores  and/or verbalization of changes             Quality of Life Scores:  Scores of 19 and below usually indicate a poorer quality of life in these areas.  A difference of  2-3 points is a clinically meaningful difference.  A difference of 2-3 points in the total score of the Quality of Life Index has been associated with significant improvement in overall quality of life, self-image, physical symptoms, and general health in studies assessing change in quality of life.  PHQ-9: Review Flowsheet       04/27/2022 07/04/2018  Depression screen PHQ 2/9  Decreased Interest 0 1  Down, Depressed, Hopeless 3 1  PHQ - 2 Score 3 2  Altered sleeping 3 2  Tired, decreased energy 0 2  Change in appetite 0 1  Feeling bad or failure about yourself  3 0  Trouble concentrating 0 1  Moving slowly or fidgety/restless 2 0  Suicidal thoughts 0 0  PHQ-9 Score 11 8  Difficult doing work/chores Very difficult Somewhat difficult   Interpretation of Total Score  Total Score Depression Severity:  1-4 = Minimal depression, 5-9 = Mild depression, 10-14 = Moderate depression, 15-19 = Moderately severe depression, 20-27 = Severe depression   Psychosocial Evaluation and Intervention:  Psychosocial Evaluation - 04/21/22 1025       Psychosocial Evaluation & Interventions   Interventions Encouraged to exercise with the program and follow exercise prescription;Relaxation education;Stress management education    Comments Masami lives with his brother and can rely on his daughter for support. Patient states that he has PTSD. Loud noises or dropping of something may set him off.    Expected Outcomes Short: Start [facility] to help with mood. Long: Maintain a healthy mental state.    Continue Psychosocial Services  Follow up required by staff             Psychosocial Re-Evaluation:   Psychosocial Discharge (Final Psychosocial Re-Evaluation):   Education: Education Goals: Education classes will be provided on  a weekly basis, covering required topics. Participant will state understanding/return demonstration of topics presented.  Learning Barriers/Preferences:  Learning Barriers/Preferences - 04/21/22 1021       Learning Barriers/Preferences   Learning Barriers None    Learning Preferences None             General Pulmonary Education Topics:  Infection Prevention: - Provides verbal and written material to individual with discussion of infection control including proper hand washing  and proper equipment cleaning during exercise session. Flowsheet Row Pulmonary Rehab from 05/11/2022 in [facility] Cardiac and Pulmonary Rehab  Date 04/21/22  Educator [facility]  Instruction Review Code 1- Verbalizes Understanding       Falls Prevention: - Provides verbal and written material to individual with discussion of falls prevention and safety. Flowsheet Row Pulmonary Rehab from 05/11/2022 in [facility] Cardiac and Pulmonary Rehab  Date 04/21/22  Educator [facility]  Instruction Review Code 1- Verbalizes Understanding       Chronic Lung Disease Review: - Group verbal instruction with posters, models, PowerPoint presentations and videos,  to review new updates, new respiratory medications, new advancements in procedures and treatments. Providing information on websites and "800" numbers for continued self-education. Includes information about supplement oxygen, available portable oxygen systems, continuous and intermittent flow rates, oxygen safety, concentrators, and Medicare reimbursement for oxygen. Explanation of Pulmonary Drugs, including class, frequency, complications, importance of spacers, rinsing mouth after steroid MDI's, and proper cleaning methods for nebulizers. Review of basic lung anatomy and physiology related to function, structure, and complications of lung disease. Review of risk factors. Discussion about methods for diagnosing sleep apnea and types of masks and machines for OSA. Includes a review of the  use of types of environmental controls: home humidity, furnaces, filters, dust mite/pet prevention, HEPA vacuums. Discussion about weather changes, air quality and the benefits of nasal washing. Instruction on Warning signs, infection symptoms, calling MD promptly, preventive modes, and value of vaccinations. Review of effective airway clearance, coughing and/or vibration techniques. Emphasizing that all should Create an Action Plan. Written material given at graduation. Flowsheet Row Pulmonary Rehab from 05/11/2022 in [facility] Cardiac and Pulmonary Rehab  Education need identified 04/27/22       AED/CPR: - Group verbal and written instruction with the use of models to demonstrate the basic use of the AED with the basic ABC's of resuscitation.    Anatomy and Cardiac Procedures: - Group verbal and visual presentation and models provide information about basic cardiac anatomy and function. Reviews the testing methods done to diagnose heart disease and the outcomes of the test results. Describes the treatment choices: Medical Management, Angioplasty, or Coronary Bypass Surgery for treating various heart conditions including Myocardial Infarction, Angina, Valve Disease, and Cardiac Arrhythmias.  Written material given at graduation.   Medication Safety: - Group verbal and visual instruction to review commonly prescribed medications for heart and lung disease. Reviews the medication, class of the drug, and side effects. Includes the steps to properly store meds and maintain the prescription regimen.  Written material given at graduation.   Other: -Provides group and verbal instruction on various topics (see comments)   Knowledge Questionnaire Score:  Knowledge Questionnaire Score - 04/27/22 1246       Knowledge Questionnaire Score   Pre Score 13/18              Core Components/Risk Factors/Patient Goals at Admission:  Personal Goals and Risk Factors at Admission - 04/27/22 1306        Core Components/Risk Factors/Patient Goals on Admission    Weight Management Yes;Weight Maintenance    Intervention Weight Management: Develop a combined nutrition and exercise program designed to reach desired caloric intake, while maintaining appropriate intake of nutrient and fiber, sodium and fats, and appropriate energy expenditure required for the weight goal.;Weight Management: Provide education and appropriate resources to help participant work on and attain dietary goals.;Weight Management/Obesity: Establish reasonable short term and long term weight goals.    Admit Weight 159  lb (72.1 kg)    Goal Weight: Short Term 159 lb (72.1 kg)    Goal Weight: Long Term 159 lb (72.1 kg)    Expected Outcomes Short Term: Continue to assess and modify interventions until short term weight is achieved;Long Term: Adherence to nutrition and physical activity/exercise program aimed toward attainment of established weight goal;Weight Maintenance: Understanding of the daily nutrition guidelines, which includes 25-35% calories from fat, 7% or less cal from saturated fats, less than '200mg'$  cholesterol, less than 1.5gm of sodium, & 5 or more servings of fruits and vegetables daily;Understanding recommendations for meals to include 15-35% energy as protein, 25-35% energy from fat, 35-60% energy from carbohydrates, less than '200mg'$  of dietary cholesterol, 20-35 gm of total fiber daily;Understanding of distribution of calorie intake throughout the day with the consumption of 4-5 meals/snacks    Tobacco Cessation Yes   using nicotine gum   Number of packs per day 0    Intervention Assist the participant in steps to quit. Provide individualized education and counseling about committing to Tobacco Cessation, relapse prevention, and pharmacological support that can be provided by physician.;Advice worker, assist with locating and accessing local/national Quit Smoking programs, and support quit date choice.     Expected Outcomes Short Term: Will demonstrate readiness to quit, by selecting a quit date.;Short Term: Will quit all tobacco product use, adhering to prevention of relapse plan.    Improve shortness of breath with ADL's Yes    Intervention Provide education, individualized exercise plan and daily activity instruction to help decrease symptoms of SOB with activities of daily living.    Expected Outcomes Short Term: Improve cardiorespiratory fitness to achieve a reduction of symptoms when performing ADLs;Long Term: Be able to perform more ADLs without symptoms or delay the onset of symptoms    Hypertension Yes    Intervention Provide education on lifestyle modifcations including regular physical activity/exercise, weight management, moderate sodium restriction and increased consumption of fresh fruit, vegetables, and low fat dairy, alcohol moderation, and smoking cessation.;Monitor prescription use compliance.    Expected Outcomes Short Term: Continued assessment and intervention until BP is < 140/21m HG in hypertensive participants. < 130/869mHG in hypertensive participants with diabetes, heart failure or chronic kidney disease.;Long Term: Maintenance of blood pressure at goal levels.             Education:Diabetes - Individual verbal and written instruction to review signs/symptoms of diabetes, desired ranges of glucose level fasting, after meals and with exercise. Acknowledge that pre and post exercise glucose checks will be done for 3 sessions at entry of program.   Know Your Numbers and Heart Failure: - Group verbal and visual instruction to discuss disease risk factors for cardiac and pulmonary disease and treatment options.  Reviews associated critical values for Overweight/Obesity, Hypertension, Cholesterol, and Diabetes.  Discusses basics of heart failure: signs/symptoms and treatments.  Introduces Heart Failure Zone chart for action plan for heart failure.  Written material given at  graduation.   Core Components/Risk Factors/Patient Goals Review:    Core Components/Risk Factors/Patient Goals at Discharge (Final Review):    ITP Comments:  ITP Comments     Row Name 04/21/22 1019 04/27/22 1245 04/27/22 1327 05/02/22 1033 05/04/22 0806   ITP Comments Virtual Visit completed. Patient informed on EP and RD appointment and 6 Minute walk test. Patient also informed of patient health questionnaires on My Chart. Patient Verbalizes understanding. Visit diagnosis can be found in [facility] in media. [facility] is [facility]. Patient is to bring  his medication list. His daughter whome is not there at the moment fixes his medications for him and knows what he takes. Completed 6MWT and gym orientation. Initial ITP created and sent for review to Dr. Ottie Glazier, Medical Director. Medication list reviewed. Draysen has recently quit tobacco use within the last 6 months. His quit date was on 04/15/22. Intervention for relapse prevention was provided at the initial medical review. He was encouraged to continue to with tobacco cessation and was provided information on relapse prevention. Patient received information about combination therapy, tobacco cessation classes, quit line, and quit smoking apps in case of a relapse. Patient demonstrated understanding of this material.Staff will continue to provide encouragement and follow up with the patient throughout the program. First full day of exercise!  Patient was oriented to gym and equipment including functions, settings, policies, and procedures.  Patient's individual exercise prescription and treatment plan were reviewed.  All starting workloads were established based on the results of the 6 minute walk test done at initial orientation visit.  The plan for exercise progression was also introduced and progression will be customized based on patient's performance and goals. 30 Day review completed. Medical Director ITP review done, changes made as directed, and signed  approval by Medical Director.    new    Row Name 05/30/22 1522 06/01/22 0723         ITP Comments Pt out for lung surgery on 05/19/22.  Awaiting follow up appointments to determine rehab moving forward. 30 Day review completed. Medical Director ITP review done, changes made as directed, and signed approval by Medical Director.   Out for medical reason               Comments:

## 2022-06-03 ENCOUNTER — Telehealth: Payer: Self-pay

## 2022-06-03 NOTE — Telephone Encounter (Signed)
Patients family member called to say that Gregory Hurley is ready to return to rehab and is going to bring clearance from his doctor next week.

## 2022-06-13 ENCOUNTER — Telehealth: Payer: Self-pay

## 2022-06-22 NOTE — Telephone Encounter (Signed)
Attempted to call again and left voicemail. Will send letter at this time.

## 2022-06-29 ENCOUNTER — Encounter: Payer: Self-pay | Admitting: *Deleted

## 2022-06-29 DIAGNOSIS — R911 Solitary pulmonary nodule: Secondary | ICD-10-CM

## 2022-06-29 NOTE — Progress Notes (Signed)
Pulmonary Individual Treatment Plan  Patient Details  Name: Gregory Hurley MRN: [id number] Date of Birth: May 14, 1946 Referring Provider:   Flowsheet Row Pulmonary Rehab from 04/27/2022 in [facility] Cardiac and Pulmonary Rehab  Referring Provider Terese Door, MD ([place])       Initial Encounter Date:  Flowsheet Row Pulmonary Rehab from 04/27/2022 in [facility] Cardiac and Pulmonary Rehab  Date 04/27/22       Visit Diagnosis: Lung nodule  Patient's Home Medications on Admission:  Current Outpatient Medications:    acetaminophen (TYLENOL) 325 MG tablet, Take 1-2 tablets (325-650 mg total) by mouth every 4 (four) hours as needed for mild pain., Disp: , Rfl:    amLODipine (NORVASC) 10 MG tablet, Take 1 tablet (10 mg total) by mouth daily., Disp: 30 tablet, Rfl: 0   aspirin 81 MG chewable tablet, Chew by mouth daily., Disp: , Rfl:    bisacodyl (DULCOLAX) 10 MG suppository, Place 1 suppository (10 mg total) rectally daily at 6 (six) AM., Disp: 30 suppository, Rfl: 0   carvedilol (COREG) 3.125 MG tablet, Take 1 tablet (3.125 mg total) by mouth 2 (two) times daily with a meal., Disp: 60 tablet, Rfl: 0   cloNIDine (CATAPRES) 0.2 MG tablet, Take 1 tablet (0.2 mg total) by mouth 2 (two) times daily., Disp: 60 tablet, Rfl: 0   gabapentin (NEURONTIN) 100 MG capsule, Take 1 capsule (100 mg total) by mouth at bedtime. (Patient taking differently: Take 100 mg by mouth 2 (two) times daily.), Disp: 30 capsule, Rfl: 0   hydrALAZINE (APRESOLINE) 25 MG tablet, Take 1 tablet (25 mg total) by mouth every 8 (eight) hours., Disp: 90 tablet, Rfl: 0   latanoprost (XALATAN) 0.005 % ophthalmic solution, Place 1 drop into both eyes at bedtime. (Patient not taking: Reported on 04/27/2022), Disp: , Rfl:    Melatonin 3 MG TABS, Take 2 tablets (6 mg total) by mouth at bedtime. IS OVER THE COUNTER. USED FOR SLEEP (Patient taking differently: Take 15 mg by mouth at bedtime. IS OVER THE COUNTER. USED FOR SLEEP), Disp: , Rfl: 0    nicotine polacrilex (NICORETTE) 2 MG gum, Take 2 mg by mouth as needed for smoking cessation., Disp: , Rfl:    PARoxetine (PAXIL) 40 MG tablet, Take 0.5 tablets (20 mg total) by mouth daily., Disp: , Rfl:    polyethylene glycol (MIRALAX / GLYCOLAX) packet, Take 17 g by mouth daily. (Patient not taking: Reported on 04/27/2022), Disp: 30 each, Rfl: 0   tamsulosin (FLOMAX) 0.4 MG CAPS capsule, Take 0.4 mg by mouth., Disp: , Rfl:    vitamin B-12 (CYANOCOBALAMIN) 1000 MCG tablet, Take 1,000 mcg by mouth daily. , Disp: , Rfl:   Past Medical History: Past Medical History:  Diagnosis Date   Anemia    CKD (chronic kidney disease), stage III (HCC)    Elevated alkaline phosphatase level    History of fracture of clavicle    History of fracture of rib    Hypertension    Pancytopenia (HCC)    PTSD (post-traumatic stress disorder)    [facility]   Thrombocytopenia (HCC)     Tobacco Use: Social History   Tobacco Use  Smoking Status Former   Packs/day: 1.00   Years: 50.00   Total pack years: 50.00   Types: Cigars, Cigarettes   Quit date: 05/20/2021   Years since quitting: 1.1  Smokeless Tobacco Never  Tobacco Comments   49 PACK YR HX SMOKING SO HIGH RISK LUNG CANCER    Labs: Review  Flowsheet        No data to display           Pulmonary Assessment Scores:  Pulmonary Assessment Scores     Row Name 04/27/22 1249         ADL UCSD   ADL Phase Entry     SOB Score total 0     Rest 0     Walk 0     Stairs 0     Bath 0     Dress 0     Shop 0       CAT Score   CAT Score 7       mMRC Score   mMRC Score 2              UCSD: Self-administered rating of dyspnea associated with activities of daily living (ADLs) 6-point scale (0 = "not at all" to 5 = "maximal or unable to do because of breathlessness")  Scoring Scores range from 0 to 120.  Minimally important difference is 5 units  CAT: CAT can identify the health impairment of COPD patients and is better  correlated with disease progression.  CAT has a scoring range of zero to 40. The CAT score is classified into four groups of low (less than 10), medium (10 - 20), high (21-30) and very high (31-40) based on the impact level of disease on health status. A CAT score over 10 suggests significant symptoms.  A worsening CAT score could be explained by an exacerbation, poor medication adherence, poor inhaler technique, or progression of COPD or comorbid conditions.  CAT MCID is 2 points  mMRC: mMRC (Modified Medical Research Council) Dyspnea Scale is used to assess the degree of baseline functional disability in patients of respiratory disease due to dyspnea. No minimal important difference is established. A decrease in score of 1 point or greater is considered a positive change.   Pulmonary Function Assessment:  Pulmonary Function Assessment - 04/21/22 1021       Breath   Shortness of Breath Yes;Limiting activity             Exercise Target Goals: Exercise Program Goal: Individual exercise prescription set using results from initial 6 min walk test and THRR while considering  patient's activity barriers and safety.   Exercise Prescription Goal: Initial exercise prescription builds to 30-45 minutes a day of aerobic activity, 2-3 days per week.  Home exercise guidelines will be given to patient during program as part of exercise prescription that the participant will acknowledge.  Education: Aerobic Exercise: - Group verbal and visual presentation on the components of exercise prescription. Introduces F.I.T.T principle from ACSM for exercise prescriptions.  Reviews F.I.T.T. principles of aerobic exercise including progression. Written material given at graduation.   Education: Resistance Exercise: - Group verbal and visual presentation on the components of exercise prescription. Introduces F.I.T.T principle from ACSM for exercise prescriptions  Reviews F.I.T.T. principles of resistance  exercise including progression. Written material given at graduation.    Education: Exercise & Equipment Safety: - Individual verbal instruction and demonstration of equipment use and safety with use of the equipment. Flowsheet Row Pulmonary Rehab from 05/11/2022 in [facility] Cardiac and Pulmonary Rehab  Date 04/21/22  Educator [facility]  Instruction Review Code 1- Verbalizes Understanding       Education: Exercise Physiology & General Exercise Guidelines: - Group verbal and written instruction with models to review the exercise physiology of the cardiovascular system and associated critical values. Provides general exercise guidelines  with specific guidelines to those with heart or lung disease.    Education: Flexibility, Balance, Mind/Body Relaxation: - Group verbal and visual presentation with interactive activity on the components of exercise prescription. Introduces F.I.T.T principle from ACSM for exercise prescriptions. Reviews F.I.T.T. principles of flexibility and balance exercise training including progression. Also discusses the mind body connection.  Reviews various relaxation techniques to help reduce and manage stress (i.e. Deep breathing, progressive muscle relaxation, and visualization). Balance handout provided to take home. Written material given at graduation. Flowsheet Row Pulmonary Rehab from 05/11/2022 in [facility] Cardiac and Pulmonary Rehab  Date 05/04/22  Educator [facility]  Instruction Review Code 1- Verbalizes Understanding       Activity Barriers & Risk Stratification:  Activity Barriers & Cardiac Risk Stratification - 04/27/22 1256       Activity Barriers & Cardiac Risk Stratification   Activity Barriers Arthritis;History of Falls;Balance Concerns;Deconditioning;Muscular Weakness;Other (comment);Assistive Device    Comments Right foot drop? Getting evaluated at [facility]             6 Minute Walk:  6 Minute Walk     Row Name 04/27/22 1309         6 Minute Walk   Phase  Initial     Distance 410 feet     Walk Time 4.6 minutes     # of Rest Breaks 1  Break 3:23- 5:02     MPH 1.01     METS 1.15     RPE 9     Perceived Dyspnea  0     VO2 Peak 4.03     Symptoms Yes (comment)     Comments Right foot drop? Bothersome     Resting HR 52 bpm     Resting BP 112/56     Resting Oxygen Saturation  96 %     Exercise Oxygen Saturation  during 6 min walk 91 %     Max Ex. HR 70 bpm     Max Ex. BP 126/54     2 Minute Post BP 122/58       Interval HR   1 Minute HR 56     2 Minute HR 57     3 Minute HR 64     4 Minute HR 60     5 Minute HR 68     6 Minute HR 70     2 Minute Post HR 54     Interval Heart Rate? Yes       Interval Oxygen   Interval Oxygen? Yes     Baseline Oxygen Saturation % 96 %     1 Minute Oxygen Saturation % 94 %     1 Minute Liters of Oxygen 0 L  RA     2 Minute Oxygen Saturation % 91 %     2 Minute Liters of Oxygen 0 L     3 Minute Oxygen Saturation % 94 %     3 Minute Liters of Oxygen 0 L     4 Minute Oxygen Saturation % 93 %     4 Minute Liters of Oxygen 0 L     5 Minute Oxygen Saturation % 91 %     5 Minute Liters of Oxygen 0 L     6 Minute Oxygen Saturation % 98 %     6 Minute Liters of Oxygen 0 L     2 Minute Post Oxygen Saturation % 96 %     2 Minute Post  Liters of Oxygen 0 L             Oxygen Initial Assessment:  Oxygen Initial Assessment - 04/27/22 1249       Home Oxygen   Home Oxygen Device None    Sleep Oxygen Prescription None    Home Exercise Oxygen Prescription None    Home Resting Oxygen Prescription None    Compliance with Home Oxygen Use Yes      Initial 6 min Walk   Oxygen Used None      Program Oxygen Prescription   Program Oxygen Prescription None      Intervention   Short Term Goals To learn and demonstrate proper pursed lip breathing techniques or other breathing techniques. ;To learn and understand importance of monitoring SPO2 with pulse oximeter and demonstrate accurate use of the pulse  oximeter.;To learn and understand importance of maintaining oxygen saturations>88%    Long  Term Goals Verbalizes importance of monitoring SPO2 with pulse oximeter and return demonstration;Maintenance of O2 saturations>88%;Exhibits proper breathing techniques, such as pursed lip breathing or other method taught during program session             Oxygen Re-Evaluation:  Oxygen Re-Evaluation     Row Name 05/02/22 1034             Program Oxygen Prescription   Program Oxygen Prescription None         Home Oxygen   Home Oxygen Device None       Sleep Oxygen Prescription None       Home Exercise Oxygen Prescription None       Home Resting Oxygen Prescription None       Compliance with Home Oxygen Use Yes         Goals/Expected Outcomes   Short Term Goals To learn and demonstrate proper pursed lip breathing techniques or other breathing techniques.        Long  Term Goals Exhibits proper breathing techniques, such as pursed lip breathing or other method taught during program session       Comments Reviewed PLB technique with pt.  Talked about how it works and it's importance in maintaining their exercise saturations.       Goals/Expected Outcomes Short: Become more profiecient at using PLB.   Long: Become independent at using PLB.                Oxygen Discharge (Final Oxygen Re-Evaluation):  Oxygen Re-Evaluation - 05/02/22 1034       Program Oxygen Prescription   Program Oxygen Prescription None      Home Oxygen   Home Oxygen Device None    Sleep Oxygen Prescription None    Home Exercise Oxygen Prescription None    Home Resting Oxygen Prescription None    Compliance with Home Oxygen Use Yes      Goals/Expected Outcomes   Short Term Goals To learn and demonstrate proper pursed lip breathing techniques or other breathing techniques.     Long  Term Goals Exhibits proper breathing techniques, such as pursed lip breathing or other method taught during program session     Comments Reviewed PLB technique with pt.  Talked about how it works and it's importance in maintaining their exercise saturations.    Goals/Expected Outcomes Short: Become more profiecient at using PLB.   Long: Become independent at using PLB.             Initial Exercise Prescription:  Initial Exercise Prescription - 04/27/22 1200  Date of Initial Exercise RX and Referring Provider   Date 04/27/22    Referring Provider Terese Door, MD ([facility])      Oxygen   Maintain Oxygen Saturation 88% or higher      Recumbant Bike   Level 1    RPM 60    Watts 8    Minutes 15    METs 1      NuStep   Level 1    SPM 80    Minutes 15    METs 1      REL-XR   Level 1    Speed 50    Minutes 15    METs 1      Biostep-RELP   Level 1    SPM 50    Minutes 15    METs 1      Track   Laps 3   as tolerated with right leg   Minutes 15    METs 1      Prescription Details   Frequency (times per week) 3    Duration Progress to 30 minutes of continuous aerobic without signs/symptoms of physical distress      Intensity   THRR 40-80% of Max Heartrate 88 - 126    Ratings of Perceived Exertion 11-13    Perceived Dyspnea 0-4      Progression   Progression Continue to progress workloads to maintain intensity without signs/symptoms of physical distress.      Resistance Training   Training Prescription Yes    Weight 2 lb    Reps 10-15             Perform Capillary Blood Glucose checks as needed.  Exercise Prescription Changes:   Exercise Prescription Changes     Row Name 04/27/22 1200 05/02/22 1300 05/17/22 1600         Response to Exercise   Blood Pressure (Admit) 112/56 142/82 140/64     Blood Pressure (Exercise) 126/54 -- --     Blood Pressure (Exit) 122/58 138/82 128/70     Heart Rate (Admit) 52 bpm 53 bpm 56 bpm     Heart Rate (Exercise) 70 bpm 67 bpm 64 bpm     Heart Rate (Exit) 50 bpm 63 bpm 55 bpm     Oxygen Saturation (Admit) 96 % 98 % 98 %      Oxygen Saturation (Exercise) 91 % 98 % 97 %     Oxygen Saturation (Exit) 96 % 93 % 97 %     Rating of Perceived Exertion (Exercise) '9 13 14     '$ Perceived Dyspnea (Exercise) 0 -- --     Symptoms Right foot drop?- Bothersome none none     Comments walk test results first full day of exercise last day before lung surgery     Duration -- Progress to 30 minutes of  aerobic without signs/symptoms of physical distress Progress to 30 minutes of  aerobic without signs/symptoms of physical distress     Intensity -- THRR unchanged THRR unchanged       Progression   Progression -- Continue to progress workloads to maintain intensity without signs/symptoms of physical distress. Continue to progress workloads to maintain intensity without signs/symptoms of physical distress.     Average METs -- -- 2.66       Resistance Training   Training Prescription -- Yes Yes     Weight -- 2 lb 3 lb     Reps -- 10-15 10-15  Interval Training   Interval Training -- No No       NuStep   Level -- 1 2     Minutes -- 15 30     METs -- -- 3.6       REL-XR   Level -- 1 --     Minutes -- 15 --       Biostep-RELP   Level -- -- 2     Minutes -- -- 30     METs -- -- 2       Oxygen   Maintain Oxygen Saturation -- 88% or higher --              Exercise Comments:   Exercise Comments     Row Name 05/02/22 1033           Exercise Comments First full day of exercise!  Patient was oriented to gym and equipment including functions, settings, policies, and procedures.  Patient's individual exercise prescription and treatment plan were reviewed.  All starting workloads were established based on the results of the 6 minute walk test done at initial orientation visit.  The plan for exercise progression was also introduced and progression will be customized based on patient's performance and goals.                Exercise Goals and Review:   Exercise Goals     Row Name 04/27/22 1304              Exercise Goals   Increase Physical Activity Yes       Intervention Provide advice, education, support and counseling about physical activity/exercise needs.;Develop an individualized exercise prescription for aerobic and resistive training based on initial evaluation findings, risk stratification, comorbidities and participant's personal goals.       Expected Outcomes Short Term: Attend rehab on a regular basis to increase amount of physical activity.;Long Term: Add in home exercise to make exercise part of routine and to increase amount of physical activity.;Long Term: Exercising regularly at least 3-5 days a week.       Increase Strength and Stamina Yes       Intervention Provide advice, education, support and counseling about physical activity/exercise needs.;Develop an individualized exercise prescription for aerobic and resistive training based on initial evaluation findings, risk stratification, comorbidities and participant's personal goals.       Expected Outcomes Short Term: Increase workloads from initial exercise prescription for resistance, speed, and METs.;Short Term: Perform resistance training exercises routinely during rehab and add in resistance training at home;Long Term: Improve cardiorespiratory fitness, muscular endurance and strength as measured by increased METs and functional capacity (6MWT)       Able to understand and use rate of perceived exertion (RPE) scale Yes       Intervention Provide education and explanation on how to use RPE scale       Expected Outcomes Short Term: Able to use RPE daily in rehab to express subjective intensity level;Long Term:  Able to use RPE to guide intensity level when exercising independently       Able to understand and use Dyspnea scale Yes       Intervention Provide education and explanation on how to use Dyspnea scale       Expected Outcomes Short Term: Able to use Dyspnea scale daily in rehab to express subjective sense of shortness of  breath during exertion;Long Term: Able to use Dyspnea scale to guide intensity level when exercising independently  Knowledge and understanding of Target Heart Rate Range (THRR) Yes       Intervention Provide education and explanation of THRR including how the numbers were predicted and where they are located for reference       Expected Outcomes Short Term: Able to state/look up THRR;Long Term: Able to use THRR to govern intensity when exercising independently;Short Term: Able to use daily as guideline for intensity in rehab       Able to check pulse independently Yes       Intervention Provide education and demonstration on how to check pulse in carotid and radial arteries.;Review the importance of being able to check your own pulse for safety during independent exercise       Expected Outcomes Short Term: Able to explain why pulse checking is important during independent exercise;Long Term: Able to check pulse independently and accurately       Understanding of Exercise Prescription Yes       Intervention Provide education, explanation, and written materials on patient's individual exercise prescription       Expected Outcomes Short Term: Able to explain program exercise prescription;Long Term: Able to explain home exercise prescription to exercise independently                Exercise Goals Re-Evaluation :  Exercise Goals Re-Evaluation     Row Name 05/02/22 1033 05/17/22 1641 05/30/22 1523 06/27/22 0855       Exercise Goal Re-Evaluation   Exercise Goals Review Able to understand and use rate of perceived exertion (RPE) scale;Able to understand and use Dyspnea scale;Knowledge and understanding of Target Heart Rate Range (THRR);Understanding of Exercise Prescription Increase Physical Activity;Increase Strength and Stamina;Understanding of Exercise Prescription -- --    Comments Reviewed RPE and dyspnea scales, THR and program prescription with pt today.  Pt voiced understanding and  was given a copy of goals to take home. Latron is off to a good start with rehab. He was only able to complete a couple week's worth of exercise sessions due to having lung surgery. Staff placed him on a medical hold and will hopefully maintain enough strength to resume our program afterwards. We will continue to follow up with patient. Out since last review Out since last review    Expected Outcomes Short: Use RPE daily to regulate intensity. Long: Follow program prescription in THR. Short: Get cleared to resume exercise Long: Continue to increase overall strength and stamina -- --             Discharge Exercise Prescription (Final Exercise Prescription Changes):  Exercise Prescription Changes - 05/17/22 1600       Response to Exercise   Blood Pressure (Admit) 140/64    Blood Pressure (Exit) 128/70    Heart Rate (Admit) 56 bpm    Heart Rate (Exercise) 64 bpm    Heart Rate (Exit) 55 bpm    Oxygen Saturation (Admit) 98 %    Oxygen Saturation (Exercise) 97 %    Oxygen Saturation (Exit) 97 %    Rating of Perceived Exertion (Exercise) 14    Symptoms none    Comments last day before lung surgery    Duration Progress to 30 minutes of  aerobic without signs/symptoms of physical distress    Intensity THRR unchanged      Progression   Progression Continue to progress workloads to maintain intensity without signs/symptoms of physical distress.    Average METs 2.66      Resistance Training   Training Prescription  Yes    Weight 3 lb    Reps 10-15      Interval Training   Interval Training No      NuStep   Level 2    Minutes 30    METs 3.6      Biostep-RELP   Level 2    Minutes 30    METs 2             Nutrition:  Target Goals: Understanding of nutrition guidelines, daily intake of sodium '1500mg'$ , cholesterol '200mg'$ , calories 30% from fat and 7% or less from saturated fats, daily to have 5 or more servings of fruits and vegetables.  Education: All About Nutrition: -Group  instruction provided by verbal, written material, interactive activities, discussions, models, and posters to present general guidelines for heart healthy nutrition including fat, fiber, MyPlate, the role of sodium in heart healthy nutrition, utilization of the nutrition label, and utilization of this knowledge for meal planning. Follow up email sent as well. Written material given at graduation. Flowsheet Row Pulmonary Rehab from 05/11/2022 in [facility] Cardiac and Pulmonary Rehab  Date 05/11/22  Educator [facility]  Instruction Review Code 1- Verbalizes Understanding       Biometrics:  Pre Biometrics - 04/27/22 1256       Pre Biometrics   Height '5\' 10"'$  (1.778 m)    Weight 159 lb 8 oz (72.3 kg)    BMI (Calculated) 22.89    Single Leg Stand 0 seconds              Nutrition Therapy Plan and Nutrition Goals:  Nutrition Therapy & Goals - 05/04/22 1038       Personal Nutrition Goals   Nutrition Goal Would not like to talk about nutrition at this time. Will continue to follow up.             Nutrition Assessments:  MEDIFICTS Score Key: ?70 Need to make dietary changes  40-70 Heart Healthy Diet ? 40 Therapeutic Level Cholesterol Diet  Flowsheet Row Pulmonary Rehab from 04/27/2022 in [facility] Cardiac and Pulmonary Rehab  Picture Your Plate Total Score on Admission 37      Picture Your Plate Scores: <54 Unhealthy dietary pattern with much room for improvement. 41-50 Dietary pattern unlikely to meet recommendations for good health and room for improvement. 51-60 More healthful dietary pattern, with some room for improvement.  >60 Healthy dietary pattern, although there may be some specific behaviors that could be improved.   Nutrition Goals Re-Evaluation:   Nutrition Goals Discharge (Final Nutrition Goals Re-Evaluation):   Psychosocial: Target Goals: Acknowledge presence or absence of significant depression and/or stress, maximize coping skills, provide positive support system.  Participant is able to verbalize types and ability to use techniques and skills needed for reducing stress and depression.   Education: Stress, Anxiety, and Depression - Group verbal and visual presentation to define topics covered.  Reviews how body is impacted by stress, anxiety, and depression.  Also discusses healthy ways to reduce stress and to treat/manage anxiety and depression.  Written material given at graduation.   Education: Sleep Hygiene -Provides group verbal and written instruction about how sleep can affect your health.  Define sleep hygiene, discuss sleep cycles and impact of sleep habits. Review good sleep hygiene tips.    Initial Review & Psychosocial Screening:  Initial Psych Review & Screening - 04/21/22 1022       Initial Review   Current issues with Current Psychotropic Meds;History of Depression  Family Dynamics   Good Support System? Yes    Comments Wanda lives with his brother and can rely on his daughter for support. Patient states that he has PTSD. Loud noises or dropping of something may set him off.      Barriers   Psychosocial barriers to participate in program The patient should benefit from training in stress management and relaxation.      Screening Interventions   Interventions Provide feedback about the scores to participant;Encouraged to exercise;To provide support and resources with identified psychosocial needs    Expected Outcomes Short Term goal: Utilizing psychosocial counselor, staff and physician to assist with identification of specific Stressors or current issues interfering with healing process. Setting desired goal for each stressor or current issue identified.;Long Term Goal: Stressors or current issues are controlled or eliminated.;Short Term goal: Identification and review with participant of any Quality of Life or Depression concerns found by scoring the questionnaire.;Long Term goal: The participant improves quality of Life and PHQ9  Scores as seen by post scores and/or verbalization of changes             Quality of Life Scores:  Scores of 19 and below usually indicate a poorer quality of life in these areas.  A difference of  2-3 points is a clinically meaningful difference.  A difference of 2-3 points in the total score of the Quality of Life Index has been associated with significant improvement in overall quality of life, self-image, physical symptoms, and general health in studies assessing change in quality of life.  PHQ-9: Review Flowsheet       04/27/2022 07/04/2018  Depression screen PHQ 2/9  Decreased Interest 0 1  Down, Depressed, Hopeless 3 1  PHQ - 2 Score 3 2  Altered sleeping 3 2  Tired, decreased energy 0 2  Change in appetite 0 1  Feeling bad or failure about yourself  3 0  Trouble concentrating 0 1  Moving slowly or fidgety/restless 2 0  Suicidal thoughts 0 0  PHQ-9 Score 11 8  Difficult doing work/chores Very difficult Somewhat difficult   Interpretation of Total Score  Total Score Depression Severity:  1-4 = Minimal depression, 5-9 = Mild depression, 10-14 = Moderate depression, 15-19 = Moderately severe depression, 20-27 = Severe depression   Psychosocial Evaluation and Intervention:  Psychosocial Evaluation - 04/21/22 1025       Psychosocial Evaluation & Interventions   Interventions Encouraged to exercise with the program and follow exercise prescription;Relaxation education;Stress management education    Comments Deaven lives with his brother and can rely on his daughter for support. Patient states that he has PTSD. Loud noises or dropping of something may set him off.    Expected Outcomes Short: Start [facility] to help with mood. Long: Maintain a healthy mental state.    Continue Psychosocial Services  Follow up required by staff             Psychosocial Re-Evaluation:   Psychosocial Discharge (Final Psychosocial Re-Evaluation):   Education: Education Goals:  Education classes will be provided on a weekly basis, covering required topics. Participant will state understanding/return demonstration of topics presented.  Learning Barriers/Preferences:  Learning Barriers/Preferences - 04/21/22 1021       Learning Barriers/Preferences   Learning Barriers None    Learning Preferences None             General Pulmonary Education Topics:  Infection Prevention: - Provides verbal and written material to individual with discussion of infection control  including proper hand washing and proper equipment cleaning during exercise session. Flowsheet Row Pulmonary Rehab from 05/11/2022 in [facility] Cardiac and Pulmonary Rehab  Date 04/21/22  Educator [facility]  Instruction Review Code 1- Verbalizes Understanding       Falls Prevention: - Provides verbal and written material to individual with discussion of falls prevention and safety. Flowsheet Row Pulmonary Rehab from 05/11/2022 in [facility] Cardiac and Pulmonary Rehab  Date 04/21/22  Educator [facility]  Instruction Review Code 1- Verbalizes Understanding       Chronic Lung Disease Review: - Group verbal instruction with posters, models, PowerPoint presentations and videos,  to review new updates, new respiratory medications, new advancements in procedures and treatments. Providing information on websites and "800" numbers for continued self-education. Includes information about supplement oxygen, available portable oxygen systems, continuous and intermittent flow rates, oxygen safety, concentrators, and Medicare reimbursement for oxygen. Explanation of Pulmonary Drugs, including class, frequency, complications, importance of spacers, rinsing mouth after steroid MDI's, and proper cleaning methods for nebulizers. Review of basic lung anatomy and physiology related to function, structure, and complications of lung disease. Review of risk factors. Discussion about methods for diagnosing sleep apnea and types of masks and  machines for OSA. Includes a review of the use of types of environmental controls: home humidity, furnaces, filters, dust mite/pet prevention, HEPA vacuums. Discussion about weather changes, air quality and the benefits of nasal washing. Instruction on Warning signs, infection symptoms, calling MD promptly, preventive modes, and value of vaccinations. Review of effective airway clearance, coughing and/or vibration techniques. Emphasizing that all should Create an Action Plan. Written material given at graduation. Flowsheet Row Pulmonary Rehab from 05/11/2022 in [facility] Cardiac and Pulmonary Rehab  Education need identified 04/27/22       AED/CPR: - Group verbal and written instruction with the use of models to demonstrate the basic use of the AED with the basic ABC's of resuscitation.    Anatomy and Cardiac Procedures: - Group verbal and visual presentation and models provide information about basic cardiac anatomy and function. Reviews the testing methods done to diagnose heart disease and the outcomes of the test results. Describes the treatment choices: Medical Management, Angioplasty, or Coronary Bypass Surgery for treating various heart conditions including Myocardial Infarction, Angina, Valve Disease, and Cardiac Arrhythmias.  Written material given at graduation.   Medication Safety: - Group verbal and visual instruction to review commonly prescribed medications for heart and lung disease. Reviews the medication, class of the drug, and side effects. Includes the steps to properly store meds and maintain the prescription regimen.  Written material given at graduation.   Other: -Provides group and verbal instruction on various topics (see comments)   Knowledge Questionnaire Score:  Knowledge Questionnaire Score - 04/27/22 1246       Knowledge Questionnaire Score   Pre Score 13/18              Core Components/Risk Factors/Patient Goals at Admission:  Personal Goals and Risk  Factors at Admission - 04/27/22 1306       Core Components/Risk Factors/Patient Goals on Admission    Weight Management Yes;Weight Maintenance    Intervention Weight Management: Develop a combined nutrition and exercise program designed to reach desired caloric intake, while maintaining appropriate intake of nutrient and fiber, sodium and fats, and appropriate energy expenditure required for the weight goal.;Weight Management: Provide education and appropriate resources to help participant work on and attain dietary goals.;Weight Management/Obesity: Establish reasonable short term and long term weight goals.  Admit Weight 159 lb (72.1 kg)    Goal Weight: Short Term 159 lb (72.1 kg)    Goal Weight: Long Term 159 lb (72.1 kg)    Expected Outcomes Short Term: Continue to assess and modify interventions until short term weight is achieved;Long Term: Adherence to nutrition and physical activity/exercise program aimed toward attainment of established weight goal;Weight Maintenance: Understanding of the daily nutrition guidelines, which includes 25-35% calories from fat, 7% or less cal from saturated fats, less than '200mg'$  cholesterol, less than 1.5gm of sodium, & 5 or more servings of fruits and vegetables daily;Understanding recommendations for meals to include 15-35% energy as protein, 25-35% energy from fat, 35-60% energy from carbohydrates, less than '200mg'$  of dietary cholesterol, 20-35 gm of total fiber daily;Understanding of distribution of calorie intake throughout the day with the consumption of 4-5 meals/snacks    Tobacco Cessation Yes   using nicotine gum   Number of packs per day 0    Intervention Assist the participant in steps to quit. Provide individualized education and counseling about committing to Tobacco Cessation, relapse prevention, and pharmacological support that can be provided by physician.;Advice worker, assist with locating and accessing local/national Quit Smoking  programs, and support quit date choice.    Expected Outcomes Short Term: Will demonstrate readiness to quit, by selecting a quit date.;Short Term: Will quit all tobacco product use, adhering to prevention of relapse plan.    Improve shortness of breath with ADL's Yes    Intervention Provide education, individualized exercise plan and daily activity instruction to help decrease symptoms of SOB with activities of daily living.    Expected Outcomes Short Term: Improve cardiorespiratory fitness to achieve a reduction of symptoms when performing ADLs;Long Term: Be able to perform more ADLs without symptoms or delay the onset of symptoms    Hypertension Yes    Intervention Provide education on lifestyle modifcations including regular physical activity/exercise, weight management, moderate sodium restriction and increased consumption of fresh fruit, vegetables, and low fat dairy, alcohol moderation, and smoking cessation.;Monitor prescription use compliance.    Expected Outcomes Short Term: Continued assessment and intervention until BP is < 140/7m HG in hypertensive participants. < 130/886mHG in hypertensive participants with diabetes, heart failure or chronic kidney disease.;Long Term: Maintenance of blood pressure at goal levels.             Education:Diabetes - Individual verbal and written instruction to review signs/symptoms of diabetes, desired ranges of glucose level fasting, after meals and with exercise. Acknowledge that pre and post exercise glucose checks will be done for 3 sessions at entry of program.   Know Your Numbers and Heart Failure: - Group verbal and visual instruction to discuss disease risk factors for cardiac and pulmonary disease and treatment options.  Reviews associated critical values for Overweight/Obesity, Hypertension, Cholesterol, and Diabetes.  Discusses basics of heart failure: signs/symptoms and treatments.  Introduces Heart Failure Zone chart for action plan for  heart failure.  Written material given at graduation.   Core Components/Risk Factors/Patient Goals Review:    Core Components/Risk Factors/Patient Goals at Discharge (Final Review):    ITP Comments:  ITP Comments     Row Name 04/21/22 1019 04/27/22 1245 04/27/22 1327 05/02/22 1033 05/04/22 0806   ITP Comments Virtual Visit completed. Patient informed on EP and RD appointment and 6 Minute walk test. Patient also informed of patient health questionnaires on My Chart. Patient Verbalizes understanding. Visit diagnosis can be found in [facility] in media. [facility] is [facility]. Patient  is to bring his medication list. His daughter whome is not there at the moment fixes his medications for him and knows what he takes. Completed 6MWT and gym orientation. Initial ITP created and sent for review to Dr. Ottie Glazier, Medical Director. Medication list reviewed. Keishawn has recently quit tobacco use within the last 6 months. His quit date was on 04/15/22. Intervention for relapse prevention was provided at the initial medical review. He was encouraged to continue to with tobacco cessation and was provided information on relapse prevention. Patient received information about combination therapy, tobacco cessation classes, quit line, and quit smoking apps in case of a relapse. Patient demonstrated understanding of this material.Staff will continue to provide encouragement and follow up with the patient throughout the program. First full day of exercise!  Patient was oriented to gym and equipment including functions, settings, policies, and procedures.  Patient's individual exercise prescription and treatment plan were reviewed.  All starting workloads were established based on the results of the 6 minute walk test done at initial orientation visit.  The plan for exercise progression was also introduced and progression will be customized based on patient's performance and goals. 30 Day review completed. Medical Director ITP review  done, changes made as directed, and signed approval by Medical Director.    new    Row Name 05/30/22 1522 06/01/22 0723 06/22/22 1008 06/29/22 0735     ITP Comments Pt out for lung surgery on 05/19/22.  Awaiting follow up appointments to determine rehab moving forward. 30 Day review completed. Medical Director ITP review done, changes made as directed, and signed approval by Medical Director.   Out for medical reason Patient and daughter have not returned calls to rehab after multiple attempts and voicemails left. Last voicemail left today. Will send letter at this time. 30 Day review completed. Medical Director ITP review done, changes made as directed, and signed approval by Medical Director.   last visit 5/25             Comments:

## 2022-07-14 ENCOUNTER — Other Ambulatory Visit: Payer: Self-pay

## 2022-07-14 ENCOUNTER — Emergency Department
Admission: EM | Admit: 2022-07-14 | Discharge: 2022-07-14 | Disposition: A | Discharge status: Home / Self Care | Payer: No Typology Code available for payment source | Attending: Emergency Medicine | Admitting: Emergency Medicine

## 2022-07-14 DIAGNOSIS — Z79899 Other long term (current) drug therapy: Secondary | ICD-10-CM | POA: Insufficient documentation

## 2022-07-14 DIAGNOSIS — Z85118 Personal history of other malignant neoplasm of bronchus and lung: Secondary | ICD-10-CM | POA: Insufficient documentation

## 2022-07-14 DIAGNOSIS — Z7982 Long term (current) use of aspirin: Secondary | ICD-10-CM | POA: Diagnosis not present

## 2022-07-14 DIAGNOSIS — K5900 Constipation, unspecified: Secondary | ICD-10-CM | POA: Insufficient documentation

## 2022-07-14 DIAGNOSIS — N189 Chronic kidney disease, unspecified: Secondary | ICD-10-CM | POA: Diagnosis not present

## 2022-07-14 DIAGNOSIS — I129 Hypertensive chronic kidney disease with stage 1 through stage 4 chronic kidney disease, or unspecified chronic kidney disease: Secondary | ICD-10-CM | POA: Diagnosis not present

## 2022-07-14 DIAGNOSIS — Z7401 Bed confinement status: Secondary | ICD-10-CM | POA: Diagnosis not present

## 2022-07-14 DIAGNOSIS — I10 Essential (primary) hypertension: Secondary | ICD-10-CM | POA: Diagnosis not present

## 2022-07-14 LAB — CBC WITH DIFFERENTIAL/PLATELET
Abs Immature Granulocytes: 0.02 10*3/uL (ref 0.00–0.07)
Basophils Absolute: 0 10*3/uL (ref 0.0–0.1)
Basophils Relative: 1 %
Eosinophils Absolute: 0.1 10*3/uL (ref 0.0–0.5)
Eosinophils Relative: 2 %
HCT: 27.6 % — ABNORMAL LOW (ref 39.0–52.0)
Hemoglobin: 8.8 g/dL — ABNORMAL LOW (ref 13.0–17.0)
Immature Granulocytes: 1 %
Lymphocytes Relative: 17 %
Lymphs Abs: 0.7 10*3/uL (ref 0.7–4.0)
MCH: 23.9 pg — ABNORMAL LOW (ref 26.0–34.0)
MCHC: 31.9 g/dL (ref 30.0–36.0)
MCV: 75 fL — ABNORMAL LOW (ref 80.0–100.0)
Monocytes Absolute: 0.1 10*3/uL (ref 0.1–1.0)
Monocytes Relative: 3 %
Neutro Abs: 3.1 10*3/uL (ref 1.7–7.7)
Neutrophils Relative %: 76 %
Platelets: 133 10*3/uL — ABNORMAL LOW (ref 150–400)
RBC: 3.68 MIL/uL — ABNORMAL LOW (ref 4.22–5.81)
RDW: 15.5 % (ref 11.5–15.5)
WBC: 4.1 10*3/uL (ref 4.0–10.5)
nRBC: 0 % (ref 0.0–0.2)

## 2022-07-14 LAB — BASIC METABOLIC PANEL
Anion gap: 7 (ref 5–15)
BUN: 29 mg/dL — ABNORMAL HIGH (ref 8–23)
CO2: 25 mmol/L (ref 22–32)
Calcium: 8.3 mg/dL — ABNORMAL LOW (ref 8.9–10.3)
Chloride: 105 mmol/L (ref 98–111)
Creatinine, Ser: 1.77 mg/dL — ABNORMAL HIGH (ref 0.61–1.24)
GFR, Estimated: 40 mL/min — ABNORMAL LOW (ref >60)
Glucose, Bld: 106 mg/dL — ABNORMAL HIGH (ref 70–99)
Potassium: 3.2 mmol/L — ABNORMAL LOW (ref 3.5–5.1)
Sodium: 137 mmol/L (ref 135–145)

## 2022-07-14 MED ORDER — DOCUSATE SODIUM 100 MG PO CAPS: 100.0000 mg | ORAL_CAPSULE | Freq: Two times a day (BID) | ORAL | 2 refills | Status: AC

## 2022-07-14 NOTE — ED Notes (Signed)
First nurse note. Per EMS pt has not had BM in 1 week. EMS reports while en route pt had BM.

## 2022-07-14 NOTE — Progress Notes (Signed)
Pulmonary Individual Treatment Plan  Patient Details  Name: Sanav Remer MRN: [id number] Date of Birth: 04/07/1946 Referring Provider:   Flowsheet Row Pulmonary Rehab from 04/27/2022 in [facility] Cardiac and Pulmonary Rehab  Referring Provider Terese Door, MD ([place])       Initial Encounter Date:  Flowsheet Row Pulmonary Rehab from 04/27/2022 in [facility] Cardiac and Pulmonary Rehab  Date 04/27/22       Visit Diagnosis: No diagnosis found.  Patient's Home Medications on Admission:  Current Outpatient Medications:    acetaminophen (TYLENOL) 325 MG tablet, Take 1-2 tablets (325-650 mg total) by mouth every 4 (four) hours as needed for mild pain., Disp: , Rfl:    amLODipine (NORVASC) 10 MG tablet, Take 1 tablet (10 mg total) by mouth daily., Disp: 30 tablet, Rfl: 0   aspirin 81 MG chewable tablet, Chew by mouth daily., Disp: , Rfl:    bisacodyl (DULCOLAX) 10 MG suppository, Place 1 suppository (10 mg total) rectally daily at 6 (six) AM., Disp: 30 suppository, Rfl: 0   carvedilol (COREG) 3.125 MG tablet, Take 1 tablet (3.125 mg total) by mouth 2 (two) times daily with a meal., Disp: 60 tablet, Rfl: 0   cloNIDine (CATAPRES) 0.2 MG tablet, Take 1 tablet (0.2 mg total) by mouth 2 (two) times daily., Disp: 60 tablet, Rfl: 0   gabapentin (NEURONTIN) 100 MG capsule, Take 1 capsule (100 mg total) by mouth at bedtime. (Patient taking differently: Take 100 mg by mouth 2 (two) times daily.), Disp: 30 capsule, Rfl: 0   hydrALAZINE (APRESOLINE) 25 MG tablet, Take 1 tablet (25 mg total) by mouth every 8 (eight) hours., Disp: 90 tablet, Rfl: 0   latanoprost (XALATAN) 0.005 % ophthalmic solution, Place 1 drop into both eyes at bedtime. (Patient not taking: Reported on 04/27/2022), Disp: , Rfl:    Melatonin 3 MG TABS, Take 2 tablets (6 mg total) by mouth at bedtime. IS OVER THE COUNTER. USED FOR SLEEP (Patient taking differently: Take 15 mg by mouth at bedtime. IS OVER THE COUNTER. USED FOR SLEEP), Disp: ,  Rfl: 0   nicotine polacrilex (NICORETTE) 2 MG gum, Take 2 mg by mouth as needed for smoking cessation., Disp: , Rfl:    PARoxetine (PAXIL) 40 MG tablet, Take 0.5 tablets (20 mg total) by mouth daily., Disp: , Rfl:    polyethylene glycol (MIRALAX / GLYCOLAX) packet, Take 17 g by mouth daily. (Patient not taking: Reported on 04/27/2022), Disp: 30 each, Rfl: 0   tamsulosin (FLOMAX) 0.4 MG CAPS capsule, Take 0.4 mg by mouth., Disp: , Rfl:    vitamin B-12 (CYANOCOBALAMIN) 1000 MCG tablet, Take 1,000 mcg by mouth daily. , Disp: , Rfl:   Past Medical History: Past Medical History:  Diagnosis Date   Anemia    CKD (chronic kidney disease), stage III (HCC)    Elevated alkaline phosphatase level    History of fracture of clavicle    History of fracture of rib    Hypertension    Pancytopenia (HCC)    PTSD (post-traumatic stress disorder)    [facility]   Thrombocytopenia (HCC)     Tobacco Use: Social History   Tobacco Use  Smoking Status Former   Packs/day: 1.00   Years: 50.00   Total pack years: 50.00   Types: Cigars, Cigarettes   Quit date: 05/20/2021   Years since quitting: 1.1  Smokeless Tobacco Never  Tobacco Comments   64 PACK YR HX SMOKING SO HIGH RISK LUNG CANCER    Labs:  Review Flowsheet        No data to display           Pulmonary Assessment Scores:  Pulmonary Assessment Scores     Row Name 04/27/22 1249         ADL UCSD   ADL Phase Entry     SOB Score total 0     Rest 0     Walk 0     Stairs 0     Bath 0     Dress 0     Shop 0       CAT Score   CAT Score 7       mMRC Score   mMRC Score 2              UCSD: Self-administered rating of dyspnea associated with activities of daily living (ADLs) 6-point scale (0 = "not at all" to 5 = "maximal or unable to do because of breathlessness")  Scoring Scores range from 0 to 120.  Minimally important difference is 5 units  CAT: CAT can identify the health impairment of COPD patients and is  better correlated with disease progression.  CAT has a scoring range of zero to 40. The CAT score is classified into four groups of low (less than 10), medium (10 - 20), high (21-30) and very high (31-40) based on the impact level of disease on health status. A CAT score over 10 suggests significant symptoms.  A worsening CAT score could be explained by an exacerbation, poor medication adherence, poor inhaler technique, or progression of COPD or comorbid conditions.  CAT MCID is 2 points  mMRC: mMRC (Modified Medical Research Council) Dyspnea Scale is used to assess the degree of baseline functional disability in patients of respiratory disease due to dyspnea. No minimal important difference is established. A decrease in score of 1 point or greater is considered a positive change.   Pulmonary Function Assessment:  Pulmonary Function Assessment - 04/21/22 1021       Breath   Shortness of Breath Yes;Limiting activity             Exercise Target Goals: Exercise Program Goal: Individual exercise prescription set using results from initial 6 min walk test and THRR while considering  patient's activity barriers and safety.   Exercise Prescription Goal: Initial exercise prescription builds to 30-45 minutes a day of aerobic activity, 2-3 days per week.  Home exercise guidelines will be given to patient during program as part of exercise prescription that the participant will acknowledge.  Education: Aerobic Exercise: - Group verbal and visual presentation on the components of exercise prescription. Introduces F.I.T.T principle from ACSM for exercise prescriptions.  Reviews F.I.T.T. principles of aerobic exercise including progression. Written material given at graduation.   Education: Resistance Exercise: - Group verbal and visual presentation on the components of exercise prescription. Introduces F.I.T.T principle from ACSM for exercise prescriptions  Reviews F.I.T.T. principles of  resistance exercise including progression. Written material given at graduation.    Education: Exercise & Equipment Safety: - Individual verbal instruction and demonstration of equipment use and safety with use of the equipment. Flowsheet Row Pulmonary Rehab from 05/11/2022 in [facility] Cardiac and Pulmonary Rehab  Date 04/21/22  Educator [facility]  Instruction Review Code 1- Verbalizes Understanding       Education: Exercise Physiology & General Exercise Guidelines: - Group verbal and written instruction with models to review the exercise physiology of the cardiovascular system and associated critical values. Provides general exercise  guidelines with specific guidelines to those with heart or lung disease.    Education: Flexibility, Balance, Mind/Body Relaxation: - Group verbal and visual presentation with interactive activity on the components of exercise prescription. Introduces F.I.T.T principle from ACSM for exercise prescriptions. Reviews F.I.T.T. principles of flexibility and balance exercise training including progression. Also discusses the mind body connection.  Reviews various relaxation techniques to help reduce and manage stress (i.e. Deep breathing, progressive muscle relaxation, and visualization). Balance handout provided to take home. Written material given at graduation. Flowsheet Row Pulmonary Rehab from 05/11/2022 in [facility] Cardiac and Pulmonary Rehab  Date 05/04/22  Educator [facility]  Instruction Review Code 1- Verbalizes Understanding       Activity Barriers & Risk Stratification:  Activity Barriers & Cardiac Risk Stratification - 04/27/22 1256       Activity Barriers & Cardiac Risk Stratification   Activity Barriers Arthritis;History of Falls;Balance Concerns;Deconditioning;Muscular Weakness;Other (comment);Assistive Device    Comments Right foot drop? Getting evaluated at [facility]             6 Minute Walk:  6 Minute Walk     Row Name 04/27/22 1309         6 Minute Walk    Phase Initial     Distance 410 feet     Walk Time 4.6 minutes     # of Rest Breaks 1  Break 3:23- 5:02     MPH 1.01     METS 1.15     RPE 9     Perceived Dyspnea  0     VO2 Peak 4.03     Symptoms Yes (comment)     Comments Right foot drop? Bothersome     Resting HR 52 bpm     Resting BP 112/56     Resting Oxygen Saturation  96 %     Exercise Oxygen Saturation  during 6 min walk 91 %     Max Ex. HR 70 bpm     Max Ex. BP 126/54     2 Minute Post BP 122/58       Interval HR   1 Minute HR 56     2 Minute HR 57     3 Minute HR 64     4 Minute HR 60     5 Minute HR 68     6 Minute HR 70     2 Minute Post HR 54     Interval Heart Rate? Yes       Interval Oxygen   Interval Oxygen? Yes     Baseline Oxygen Saturation % 96 %     1 Minute Oxygen Saturation % 94 %     1 Minute Liters of Oxygen 0 L  RA     2 Minute Oxygen Saturation % 91 %     2 Minute Liters of Oxygen 0 L     3 Minute Oxygen Saturation % 94 %     3 Minute Liters of Oxygen 0 L     4 Minute Oxygen Saturation % 93 %     4 Minute Liters of Oxygen 0 L     5 Minute Oxygen Saturation % 91 %     5 Minute Liters of Oxygen 0 L     6 Minute Oxygen Saturation % 98 %     6 Minute Liters of Oxygen 0 L     2 Minute Post Oxygen Saturation % 96 %     2 Minute  Post Liters of Oxygen 0 L             Oxygen Initial Assessment:  Oxygen Initial Assessment - 04/27/22 1249       Home Oxygen   Home Oxygen Device None    Sleep Oxygen Prescription None    Home Exercise Oxygen Prescription None    Home Resting Oxygen Prescription None    Compliance with Home Oxygen Use Yes      Initial 6 min Walk   Oxygen Used None      Program Oxygen Prescription   Program Oxygen Prescription None      Intervention   Short Term Goals To learn and demonstrate proper pursed lip breathing techniques or other breathing techniques. ;To learn and understand importance of monitoring SPO2 with pulse oximeter and demonstrate accurate use of the  pulse oximeter.;To learn and understand importance of maintaining oxygen saturations>88%    Long  Term Goals Verbalizes importance of monitoring SPO2 with pulse oximeter and return demonstration;Maintenance of O2 saturations>88%;Exhibits proper breathing techniques, such as pursed lip breathing or other method taught during program session             Oxygen Re-Evaluation:  Oxygen Re-Evaluation     Row Name 05/02/22 1034             Program Oxygen Prescription   Program Oxygen Prescription None         Home Oxygen   Home Oxygen Device None       Sleep Oxygen Prescription None       Home Exercise Oxygen Prescription None       Home Resting Oxygen Prescription None       Compliance with Home Oxygen Use Yes         Goals/Expected Outcomes   Short Term Goals To learn and demonstrate proper pursed lip breathing techniques or other breathing techniques.        Long  Term Goals Exhibits proper breathing techniques, such as pursed lip breathing or other method taught during program session       Comments Reviewed PLB technique with pt.  Talked about how it works and it's importance in maintaining their exercise saturations.       Goals/Expected Outcomes Short: Become more profiecient at using PLB.   Long: Become independent at using PLB.                Oxygen Discharge (Final Oxygen Re-Evaluation):  Oxygen Re-Evaluation - 05/02/22 1034       Program Oxygen Prescription   Program Oxygen Prescription None      Home Oxygen   Home Oxygen Device None    Sleep Oxygen Prescription None    Home Exercise Oxygen Prescription None    Home Resting Oxygen Prescription None    Compliance with Home Oxygen Use Yes      Goals/Expected Outcomes   Short Term Goals To learn and demonstrate proper pursed lip breathing techniques or other breathing techniques.     Long  Term Goals Exhibits proper breathing techniques, such as pursed lip breathing or other method taught during program  session    Comments Reviewed PLB technique with pt.  Talked about how it works and it's importance in maintaining their exercise saturations.    Goals/Expected Outcomes Short: Become more profiecient at using PLB.   Long: Become independent at using PLB.             Initial Exercise Prescription:  Initial Exercise Prescription - 04/27/22  1200       Date of Initial Exercise RX and Referring Provider   Date 04/27/22    Referring Provider Terese Door, MD ([facility])      Oxygen   Maintain Oxygen Saturation 88% or higher      Recumbant Bike   Level 1    RPM 60    Watts 8    Minutes 15    METs 1      NuStep   Level 1    SPM 80    Minutes 15    METs 1      REL-XR   Level 1    Speed 50    Minutes 15    METs 1      Biostep-RELP   Level 1    SPM 50    Minutes 15    METs 1      Track   Laps 3   as tolerated with right leg   Minutes 15    METs 1      Prescription Details   Frequency (times per week) 3    Duration Progress to 30 minutes of continuous aerobic without signs/symptoms of physical distress      Intensity   THRR 40-80% of Max Heartrate 88 - 126    Ratings of Perceived Exertion 11-13    Perceived Dyspnea 0-4      Progression   Progression Continue to progress workloads to maintain intensity without signs/symptoms of physical distress.      Resistance Training   Training Prescription Yes    Weight 2 lb    Reps 10-15             Perform Capillary Blood Glucose checks as needed.  Exercise Prescription Changes:   Exercise Prescription Changes     Row Name 04/27/22 1200 05/02/22 1300 05/17/22 1600         Response to Exercise   Blood Pressure (Admit) 112/56 142/82 140/64     Blood Pressure (Exercise) 126/54 -- --     Blood Pressure (Exit) 122/58 138/82 128/70     Heart Rate (Admit) 52 bpm 53 bpm 56 bpm     Heart Rate (Exercise) 70 bpm 67 bpm 64 bpm     Heart Rate (Exit) 50 bpm 63 bpm 55 bpm     Oxygen Saturation (Admit) 96 % 98  % 98 %     Oxygen Saturation (Exercise) 91 % 98 % 97 %     Oxygen Saturation (Exit) 96 % 93 % 97 %     Rating of Perceived Exertion (Exercise) '9 13 14     '$ Perceived Dyspnea (Exercise) 0 -- --     Symptoms Right foot drop?- Bothersome none none     Comments walk test results first full day of exercise last day before lung surgery     Duration -- Progress to 30 minutes of  aerobic without signs/symptoms of physical distress Progress to 30 minutes of  aerobic without signs/symptoms of physical distress     Intensity -- THRR unchanged THRR unchanged       Progression   Progression -- Continue to progress workloads to maintain intensity without signs/symptoms of physical distress. Continue to progress workloads to maintain intensity without signs/symptoms of physical distress.     Average METs -- -- 2.66       Resistance Training   Training Prescription -- Yes Yes     Weight -- 2 lb 3 lb  Reps -- 10-15 10-15       Interval Training   Interval Training -- No No       NuStep   Level -- 1 2     Minutes -- 15 30     METs -- -- 3.6       REL-XR   Level -- 1 --     Minutes -- 15 --       Biostep-RELP   Level -- -- 2     Minutes -- -- 30     METs -- -- 2       Oxygen   Maintain Oxygen Saturation -- 88% or higher --              Exercise Comments:   Exercise Comments     Row Name 05/02/22 1033           Exercise Comments First full day of exercise!  Patient was oriented to gym and equipment including functions, settings, policies, and procedures.  Patient's individual exercise prescription and treatment plan were reviewed.  All starting workloads were established based on the results of the 6 minute walk test done at initial orientation visit.  The plan for exercise progression was also introduced and progression will be customized based on patient's performance and goals.                Exercise Goals and Review:   Exercise Goals     Row Name 04/27/22 1304              Exercise Goals   Increase Physical Activity Yes       Intervention Provide advice, education, support and counseling about physical activity/exercise needs.;Develop an individualized exercise prescription for aerobic and resistive training based on initial evaluation findings, risk stratification, comorbidities and participant's personal goals.       Expected Outcomes Short Term: Attend rehab on a regular basis to increase amount of physical activity.;Long Term: Add in home exercise to make exercise part of routine and to increase amount of physical activity.;Long Term: Exercising regularly at least 3-5 days a week.       Increase Strength and Stamina Yes       Intervention Provide advice, education, support and counseling about physical activity/exercise needs.;Develop an individualized exercise prescription for aerobic and resistive training based on initial evaluation findings, risk stratification, comorbidities and participant's personal goals.       Expected Outcomes Short Term: Increase workloads from initial exercise prescription for resistance, speed, and METs.;Short Term: Perform resistance training exercises routinely during rehab and add in resistance training at home;Long Term: Improve cardiorespiratory fitness, muscular endurance and strength as measured by increased METs and functional capacity (6MWT)       Able to understand and use rate of perceived exertion (RPE) scale Yes       Intervention Provide education and explanation on how to use RPE scale       Expected Outcomes Short Term: Able to use RPE daily in rehab to express subjective intensity level;Long Term:  Able to use RPE to guide intensity level when exercising independently       Able to understand and use Dyspnea scale Yes       Intervention Provide education and explanation on how to use Dyspnea scale       Expected Outcomes Short Term: Able to use Dyspnea scale daily in rehab to express subjective sense of  shortness of breath during exertion;Long Term: Able to use Dyspnea  scale to guide intensity level when exercising independently       Knowledge and understanding of Target Heart Rate Range (THRR) Yes       Intervention Provide education and explanation of THRR including how the numbers were predicted and where they are located for reference       Expected Outcomes Short Term: Able to state/look up THRR;Long Term: Able to use THRR to govern intensity when exercising independently;Short Term: Able to use daily as guideline for intensity in rehab       Able to check pulse independently Yes       Intervention Provide education and demonstration on how to check pulse in carotid and radial arteries.;Review the importance of being able to check your own pulse for safety during independent exercise       Expected Outcomes Short Term: Able to explain why pulse checking is important during independent exercise;Long Term: Able to check pulse independently and accurately       Understanding of Exercise Prescription Yes       Intervention Provide education, explanation, and written materials on patient's individual exercise prescription       Expected Outcomes Short Term: Able to explain program exercise prescription;Long Term: Able to explain home exercise prescription to exercise independently                Exercise Goals Re-Evaluation :  Exercise Goals Re-Evaluation     Row Name 05/02/22 1033 05/17/22 1641 05/30/22 1523 06/27/22 0855       Exercise Goal Re-Evaluation   Exercise Goals Review Able to understand and use rate of perceived exertion (RPE) scale;Able to understand and use Dyspnea scale;Knowledge and understanding of Target Heart Rate Range (THRR);Understanding of Exercise Prescription Increase Physical Activity;Increase Strength and Stamina;Understanding of Exercise Prescription -- --    Comments Reviewed RPE and dyspnea scales, THR and program prescription with pt today.  Pt voiced  understanding and was given a copy of goals to take home. Lamar is off to a good start with rehab. He was only able to complete a couple week's worth of exercise sessions due to having lung surgery. Staff placed him on a medical hold and will hopefully maintain enough strength to resume our program afterwards. We will continue to follow up with patient. Out since last review Out since last review    Expected Outcomes Short: Use RPE daily to regulate intensity. Long: Follow program prescription in THR. Short: Get cleared to resume exercise Long: Continue to increase overall strength and stamina -- --             Discharge Exercise Prescription (Final Exercise Prescription Changes):  Exercise Prescription Changes - 05/17/22 1600       Response to Exercise   Blood Pressure (Admit) 140/64    Blood Pressure (Exit) 128/70    Heart Rate (Admit) 56 bpm    Heart Rate (Exercise) 64 bpm    Heart Rate (Exit) 55 bpm    Oxygen Saturation (Admit) 98 %    Oxygen Saturation (Exercise) 97 %    Oxygen Saturation (Exit) 97 %    Rating of Perceived Exertion (Exercise) 14    Symptoms none    Comments last day before lung surgery    Duration Progress to 30 minutes of  aerobic without signs/symptoms of physical distress    Intensity THRR unchanged      Progression   Progression Continue to progress workloads to maintain intensity without signs/symptoms of physical distress.  Average METs 2.66      Resistance Training   Training Prescription Yes    Weight 3 lb    Reps 10-15      Interval Training   Interval Training No      NuStep   Level 2    Minutes 30    METs 3.6      Biostep-RELP   Level 2    Minutes 30    METs 2             Nutrition:  Target Goals: Understanding of nutrition guidelines, daily intake of sodium '1500mg'$ , cholesterol '200mg'$ , calories 30% from fat and 7% or less from saturated fats, daily to have 5 or more servings of fruits and vegetables.  Education: All About  Nutrition: -Group instruction provided by verbal, written material, interactive activities, discussions, models, and posters to present general guidelines for heart healthy nutrition including fat, fiber, MyPlate, the role of sodium in heart healthy nutrition, utilization of the nutrition label, and utilization of this knowledge for meal planning. Follow up email sent as well. Written material given at graduation. Flowsheet Row Pulmonary Rehab from 05/11/2022 in [facility] Cardiac and Pulmonary Rehab  Date 05/11/22  Educator [facility]  Instruction Review Code 1- Verbalizes Understanding       Biometrics:  Pre Biometrics - 04/27/22 1256       Pre Biometrics   Height '5\' 10"'$  (1.778 m)    Weight 159 lb 8 oz (72.3 kg)    BMI (Calculated) 22.89    Single Leg Stand 0 seconds              Nutrition Therapy Plan and Nutrition Goals:  Nutrition Therapy & Goals - 05/04/22 1038       Personal Nutrition Goals   Nutrition Goal Would not like to talk about nutrition at this time. Will continue to follow up.             Nutrition Assessments:  MEDIFICTS Score Key: ?70 Need to make dietary changes  40-70 Heart Healthy Diet ? 40 Therapeutic Level Cholesterol Diet  Flowsheet Row Pulmonary Rehab from 04/27/2022 in [facility] Cardiac and Pulmonary Rehab  Picture Your Plate Total Score on Admission 37      Picture Your Plate Scores: <27 Unhealthy dietary pattern with much room for improvement. 41-50 Dietary pattern unlikely to meet recommendations for good health and room for improvement. 51-60 More healthful dietary pattern, with some room for improvement.  >60 Healthy dietary pattern, although there may be some specific behaviors that could be improved.   Nutrition Goals Re-Evaluation:   Nutrition Goals Discharge (Final Nutrition Goals Re-Evaluation):   Psychosocial: Target Goals: Acknowledge presence or absence of significant depression and/or stress, maximize coping skills, provide positive  support system. Participant is able to verbalize types and ability to use techniques and skills needed for reducing stress and depression.   Education: Stress, Anxiety, and Depression - Group verbal and visual presentation to define topics covered.  Reviews how body is impacted by stress, anxiety, and depression.  Also discusses healthy ways to reduce stress and to treat/manage anxiety and depression.  Written material given at graduation.   Education: Sleep Hygiene -Provides group verbal and written instruction about how sleep can affect your health.  Define sleep hygiene, discuss sleep cycles and impact of sleep habits. Review good sleep hygiene tips.    Initial Review & Psychosocial Screening:  Initial Psych Review & Screening - 04/21/22 1022       Initial Review  Current issues with Current Psychotropic Meds;History of Depression      Family Dynamics   Good Support System? Yes    Comments Kaleem lives with his brother and can rely on his daughter for support. Patient states that he has PTSD. Loud noises or dropping of something may set him off.      Barriers   Psychosocial barriers to participate in program The patient should benefit from training in stress management and relaxation.      Screening Interventions   Interventions Provide feedback about the scores to participant;Encouraged to exercise;To provide support and resources with identified psychosocial needs    Expected Outcomes Short Term goal: Utilizing psychosocial counselor, staff and physician to assist with identification of specific Stressors or current issues interfering with healing process. Setting desired goal for each stressor or current issue identified.;Long Term Goal: Stressors or current issues are controlled or eliminated.;Short Term goal: Identification and review with participant of any Quality of Life or Depression concerns found by scoring the questionnaire.;Long Term goal: The participant improves quality of  Life and PHQ9 Scores as seen by post scores and/or verbalization of changes             Quality of Life Scores:  Scores of 19 and below usually indicate a poorer quality of life in these areas.  A difference of  2-3 points is a clinically meaningful difference.  A difference of 2-3 points in the total score of the Quality of Life Index has been associated with significant improvement in overall quality of life, self-image, physical symptoms, and general health in studies assessing change in quality of life.  PHQ-9: Review Flowsheet       04/27/2022 07/04/2018  Depression screen PHQ 2/9  Decreased Interest 0 1  Down, Depressed, Hopeless 3 1  PHQ - 2 Score 3 2  Altered sleeping 3 2  Tired, decreased energy 0 2  Change in appetite 0 1  Feeling bad or failure about yourself  3 0  Trouble concentrating 0 1  Moving slowly or fidgety/restless 2 0  Suicidal thoughts 0 0  PHQ-9 Score 11 8  Difficult doing work/chores Very difficult Somewhat difficult   Interpretation of Total Score  Total Score Depression Severity:  1-4 = Minimal depression, 5-9 = Mild depression, 10-14 = Moderate depression, 15-19 = Moderately severe depression, 20-27 = Severe depression   Psychosocial Evaluation and Intervention:  Psychosocial Evaluation - 04/21/22 1025       Psychosocial Evaluation & Interventions   Interventions Encouraged to exercise with the program and follow exercise prescription;Relaxation education;Stress management education    Comments Kyo lives with his brother and can rely on his daughter for support. Patient states that he has PTSD. Loud noises or dropping of something may set him off.    Expected Outcomes Short: Start [facility] to help with mood. Long: Maintain a healthy mental state.    Continue Psychosocial Services  Follow up required by staff             Psychosocial Re-Evaluation:   Psychosocial Discharge (Final Psychosocial Re-Evaluation):   Education: Education  Goals: Education classes will be provided on a weekly basis, covering required topics. Participant will state understanding/return demonstration of topics presented.  Learning Barriers/Preferences:  Learning Barriers/Preferences - 04/21/22 1021       Learning Barriers/Preferences   Learning Barriers None    Learning Preferences None             General Pulmonary Education Topics:  Infection Prevention: -  Provides verbal and written material to individual with discussion of infection control including proper hand washing and proper equipment cleaning during exercise session. Flowsheet Row Pulmonary Rehab from 05/11/2022 in [facility] Card[facility] Pulmonary Rehab  Date 04/21/22  Educator [facility]  Instruction Review Code 1- Verbalizes Understanding       Falls Prevention: - Provides verbal and written material to individual with discussion of falls prevention and safety. Flowsheet Row Pulmonary Rehab from 05/11/2022 in [facility] Cardiac and Pulmonary Rehab  Date 04/21/22  Educator [facility]  Instruction Review Code 1- Verbalizes Understanding       Chronic Lung Disease Review: - Group verbal instruction with posters, models, PowerPoint presentations and videos,  to review new updates, new respiratory medications, new advancements in procedures and treatments. Providing information on websites and "800" numbers for continued self-education. Includes information about supplement oxygen, available portable oxygen systems, continuous and intermittent flow rates, oxygen safety, concentrators, and Medicare reimbursement for oxygen. Explanation of Pulmonary Drugs, including class, frequency, complications, importance of spacers, rinsing mouth after steroid MDI's, and proper cleaning methods for nebulizers. Review of basic lung anatomy and physiology related to function, structure, and complications of lung disease. Review of risk factors. Discussion about methods for diagnosing sleep apnea and types of masks and  machines for OSA. Includes a review of the use of types of environmental controls: home humidity, furnaces, filters, dust mite/pet prevention, HEPA vacuums. Discussion about weather changes, air quality and the benefits of nasal washing. Instruction on Warning signs, infection symptoms, calling MD promptly, preventive modes, and value of vaccinations. Review of effective airway clearance, coughing and/or vibration techniques. Emphasizing that all should Create an Action Plan. Written material given at graduation. Flowsheet Row Pulmonary Rehab from 05/11/2022 in [facility] Cardiac and Pulmonary Rehab  Education need identified 04/27/22       AED/CPR: - Group verbal and written instruction with the use of models to demonstrate the basic use of the AED with the basic ABC's of resuscitation.    Anatomy and Cardiac Procedures: - Group verbal and visual presentation and models provide information about basic cardiac anatomy and function. Reviews the testing methods done to diagnose heart disease and the outcomes of the test results. Describes the treatment choices: Medical Management, Angioplasty, or Coronary Bypass Surgery for treating various heart conditions including Myocardial Infarction, Angina, Valve Disease, and Cardiac Arrhythmias.  Written material given at graduation.   Medication Safety: - Group verbal and visual instruction to review commonly prescribed medications for heart and lung disease. Reviews the medication, class of the drug, and side effects. Includes the steps to properly store meds and maintain the prescription regimen.  Written material given at graduation.   Other: -Provides group and verbal instruction on various topics (see comments)   Knowledge Questionnaire Score:  Knowledge Questionnaire Score - 04/27/22 1246       Knowledge Questionnaire Score   Pre Score 13/18              Core Components/Risk Factors/Patient Goals at Admission:  Personal Goals and Risk  Factors at Admission - 04/27/22 1306       Core Components/Risk Factors/Patient Goals on Admission    Weight Management Yes;Weight Maintenance    Intervention Weight Management: Develop a combined nutrition and exercise program designed to reach desired caloric intake, while maintaining appropriate intake of nutrient and fiber, sodium and fats, and appropriate energy expenditure required for the weight goal.;Weight Management: Provide education and appropriate resources to help participant work on and attain dietary goals.;Weight  Management/Obesity: Establish reasonable short term and long term weight goals.    Admit Weight 159 lb (72.1 kg)    Goal Weight: Short Term 159 lb (72.1 kg)    Goal Weight: Long Term 159 lb (72.1 kg)    Expected Outcomes Short Term: Continue to assess and modify interventions until short term weight is achieved;Long Term: Adherence to nutrition and physical activity/exercise program aimed toward attainment of established weight goal;Weight Maintenance: Understanding of the daily nutrition guidelines, which includes 25-35% calories from fat, 7% or less cal from saturated fats, less than '200mg'$  cholesterol, less than 1.5gm of sodium, & 5 or more servings of fruits and vegetables daily;Understanding recommendations for meals to include 15-35% energy as protein, 25-35% energy from fat, 35-60% energy from carbohydrates, less than '200mg'$  of dietary cholesterol, 20-35 gm of total fiber daily;Understanding of distribution of calorie intake throughout the day with the consumption of 4-5 meals/snacks    Tobacco Cessation Yes   using nicotine gum   Number of packs per day 0    Intervention Assist the participant in steps to quit. Provide individualized education and counseling about committing to Tobacco Cessation, relapse prevention, and pharmacological support that can be provided by physician.;Advice worker, assist with locating and accessing local/national Quit Smoking  programs, and support quit date choice.    Expected Outcomes Short Term: Will demonstrate readiness to quit, by selecting a quit date.;Short Term: Will quit all tobacco product use, adhering to prevention of relapse plan.    Improve shortness of breath with ADL's Yes    Intervention Provide education, individualized exercise plan and daily activity instruction to help decrease symptoms of SOB with activities of daily living.    Expected Outcomes Short Term: Improve cardiorespiratory fitness to achieve a reduction of symptoms when performing ADLs;Long Term: Be able to perform more ADLs without symptoms or delay the onset of symptoms    Hypertension Yes    Intervention Provide education on lifestyle modifcations including regular physical activity/exercise, weight management, moderate sodium restriction and increased consumption of fresh fruit, vegetables, and low fat dairy, alcohol moderation, and smoking cessation.;Monitor prescription use compliance.    Expected Outcomes Short Term: Continued assessment and intervention until BP is < 140/69m HG in hypertensive participants. < 130/856mHG in hypertensive participants with diabetes, heart failure or chronic kidney disease.;Long Term: Maintenance of blood pressure at goal levels.             Education:Diabetes - Individual verbal and written instruction to review signs/symptoms of diabetes, desired ranges of glucose level fasting, after meals and with exercise. Acknowledge that pre and post exercise glucose checks will be done for 3 sessions at entry of program.   Know Your Numbers and Heart Failure: - Group verbal and visual instruction to discuss disease risk factors for cardiac and pulmonary disease and treatment options.  Reviews associated critical values for Overweight/Obesity, Hypertension, Cholesterol, and Diabetes.  Discusses basics of heart failure: signs/symptoms and treatments.  Introduces Heart Failure Zone chart for action plan for  heart failure.  Written material given at graduation.   Core Components/Risk Factors/Patient Goals Review:    Core Components/Risk Factors/Patient Goals at Discharge (Final Review):    ITP Comments:  ITP Comments     Row Name 04/21/22 1019 04/27/22 1245 04/27/22 1327 05/02/22 1033 05/04/22 0806   ITP Comments Virtual Visit completed. Patient informed on EP and RD appointment and 6 Minute walk test. Patient also informed of patient health questionnaires on My Chart. Patient Verbalizes understanding.  Visit diagnosis can be found in [facility] in media. [facility] is [facility]. Patient is to bring his medication list. His daughter whome is not there at the moment fixes his medications for him and knows what he takes. Completed 6MWT and gym orientation. Initial ITP created and sent for review to Dr. Ottie Glazier, Medical Director. Medication list reviewed. Jakayden has recently quit tobacco use within the last 6 months. His quit date was on 04/15/22. Intervention for relapse prevention was provided at the initial medical review. He was encouraged to continue to with tobacco cessation and was provided information on relapse prevention. Patient received information about combination therapy, tobacco cessation classes, quit line, and quit smoking apps in case of a relapse. Patient demonstrated understanding of this material.Staff will continue to provide encouragement and follow up with the patient throughout the program. First full day of exercise!  Patient was oriented to gym and equipment including functions, settings, policies, and procedures.  Patient's individual exercise prescription and treatment plan were reviewed.  All starting workloads were established based on the results of the 6 minute walk test done at initial orientation visit.  The plan for exercise progression was also introduced and progression will be customized based on patient's performance and goals. 30 Day review completed. Medical Director ITP review  done, changes made as directed, and signed approval by Medical Director.    new    Row Name 05/30/22 1522 06/01/22 0723 06/22/22 1008 06/29/22 0735 07/14/22 1522   ITP Comments Pt out for lung surgery on 05/19/22.  Awaiting follow up appointments to determine rehab moving forward. 30 Day review completed. Medical Director ITP review done, changes made as directed, and signed approval by Medical Director.   Out for medical reason Patient and daughter have not returned calls to rehab after multiple attempts and voicemails left. Last voicemail left today. Will send letter at this time. 30 Day review completed. Medical Director ITP review done, changes made as directed, and signed approval by Medical Director.   last visit 5/25 Patient has not attended since 5/25. Patient ans family have not returned calls and letter was sent earlier this morning. will discharge at this time.            Comments: Discharge ITP

## 2022-07-14 NOTE — ED Triage Notes (Signed)
C/o constipation, pt. States last BM was 7/17, he has been taking stool softeners x4 days. Pt. Is stage 4 lung CA, states he has chemo scheduled tomorrow.  Medic states pt. Has been extra weak x several days.

## 2022-07-14 NOTE — Progress Notes (Signed)
Discharge Progress Report  Patient Details  Name: Gregory Hurley MRN: [id number] Date of Birth: 1946/07/07 Referring Provider:   Flowsheet Row Pulmonary Rehab from 04/27/2022 in [facility] Cardiac and Pulmonary Rehab  Referring Provider Terese Door, MD ([place])        Number of Visits: 7  Reason for Discharge:  Early Exit:  Personal/ Medical: Patient out for surgery, placed on medical hold. Never returned back.  Smoking History:  Social History   Tobacco Use  Smoking Status Former   Packs/day: 1.00   Years: 50.00   Total pack years: 50.00   Types: Cigars, Cigarettes   Quit date: 05/20/2021   Years since quitting: 1.1  Smokeless Tobacco Never  Tobacco Comments   33 PACK YR HX SMOKING SO HIGH RISK LUNG CANCER    Diagnosis:  No diagnosis found.  ADL UCSD:  Pulmonary Assessment Scores     Row Name 04/27/22 1249         ADL UCSD   ADL Phase Entry     SOB Score total 0     Rest 0     Walk 0     Stairs 0     Bath 0     Dress 0     Shop 0       CAT Score   CAT Score 7       mMRC Score   mMRC Score 2              Initial Exercise Prescription:  Initial Exercise Prescription - 04/27/22 1200       Date of Initial Exercise RX and Referring Provider   Date 04/27/22    Referring Provider Terese Door, [place] ([place])      Oxygen   Maintain Oxygen Saturation 88% or higher      Recumbant Bike   Level 1    RPM 60    Watts 8    Minutes 15    METs 1      NuStep   Level 1    SPM 80    Minutes 15    METs 1      REL-XR   Level 1    Speed 50    Minutes 15    METs 1      Biostep-RELP   Level 1    SPM 50    Minutes 15    METs 1      Track   Laps 3   as tolerated with right leg   Minutes 15    METs 1      Prescription Details   Frequency (times per week) 3    Duration Progress to 30 minutes of continuous aerobic without signs/symptoms of physical distress      Intensity   THRR 40-80% of Max Heartrate 88 - 126    Ratings of Perceived  Exertion 11-13    Perceived Dyspnea 0-4      Progression   Progression Continue to progress workloads to maintain intensity without signs/symptoms of physical distress.      Resistance Training   Training Prescription Yes    Weight 2 lb    Reps 10-15             Discharge Exercise Prescription (Final Exercise Prescription Changes):  Exercise Prescription Changes - 05/17/22 1600       Response to Exercise   Blood Pressure (Admit) 140/64    Blood Pressure (Exit) 128/70    Heart Rate (Admit) 56 bpm  Heart Rate (Exercise) 64 bpm    Heart Rate (Exit) 55 bpm    Oxygen Saturation (Admit) 98 %    Oxygen Saturation (Exercise) 97 %    Oxygen Saturation (Exit) 97 %    Rating of Perceived Exertion (Exercise) 14    Symptoms none    Comments last day before lung surgery    Duration Progress to 30 minutes of  aerobic without signs/symptoms of physical distress    Intensity THRR unchanged      Progression   Progression Continue to progress workloads to maintain intensity without signs/symptoms of physical distress.    Average METs 2.66      Resistance Training   Training Prescription Yes    Weight 3 lb    Reps 10-15      Interval Training   Interval Training No      NuStep   Level 2    Minutes 30    METs 3.6      Biostep-RELP   Level 2    Minutes 30    METs 2             Functional Capacity:  6 Minute Walk     Row Name 04/27/22 1309         6 Minute Walk   Phase Initial     Distance 410 feet     Walk Time 4.6 minutes     # of Rest Breaks 1  Break 3:23- 5:02     MPH 1.01     METS 1.15     RPE 9     Perceived Dyspnea  0     VO2 Peak 4.03     Symptoms Yes (comment)     Comments Right foot drop? Bothersome     Resting HR 52 bpm     Resting BP 112/56     Resting Oxygen Saturation  96 %     Exercise Oxygen Saturation  during 6 min walk 91 %     Max Ex. HR 70 bpm     Max Ex. BP 126/54     2 Minute Post BP 122/58       Interval HR   1 Minute HR 56      2 Minute HR 57     3 Minute HR 64     4 Minute HR 60     5 Minute HR 68     6 Minute HR 70     2 Minute Post HR 54     Interval Heart Rate? Yes       Interval Oxygen   Interval Oxygen? Yes     Baseline Oxygen Saturation % 96 %     1 Minute Oxygen Saturation % 94 %     1 Minute Liters of Oxygen 0 L  RA     2 Minute Oxygen Saturation % 91 %     2 Minute Liters of Oxygen 0 L     3 Minute Oxygen Saturation % 94 %     3 Minute Liters of Oxygen 0 L     4 Minute Oxygen Saturation % 93 %     4 Minute Liters of Oxygen 0 L     5 Minute Oxygen Saturation % 91 %     5 Minute Liters of Oxygen 0 L     6 Minute Oxygen Saturation % 98 %     6 Minute Liters of Oxygen 0 L  2 Minute Post Oxygen Saturation % 96 %     2 Minute Post Liters of Oxygen 0 L               Nutrition & Weight - Outcomes:  Pre Biometrics - 04/27/22 1256       Pre Biometrics   Height '5\' 10"'$  (1.778 m)    Weight 159 lb 8 oz (72.3 kg)    BMI (Calculated) 22.89    Single Leg Stand 0 seconds              Nutrition:  Nutrition Therapy & Goals - 05/04/22 1038       Personal Nutrition Goals   Nutrition Goal Would not like to talk about nutrition at this time. Will continue to follow up.             Goals reviewed with patient; copy given to patient.

## 2022-07-14 NOTE — Discharge Instructions (Addendum)
I recommend that you increase your water and fiber intake. If you are not able to eat foods high in fiber, you may use Benefiber or Metamucil over-the-counter. I also recommend you use MiraLAX 1-2 times a day and Colace 100 mg twice a day to help with bowel movements. These medications are over the counter.  You may use other over-the-counter medications such as Dulcolax, Fleet enemas, magnesium citrate as needed for constipation. Please note that some of these medications may cause you to have abdominal cramping which is normal. If you develop severe abdominal pain, fever (temperature of 100.4 or higher), persistent vomiting, distention of your abdomen, unable to have a bowel movement for 5 days or are not passing gas, please return to the hospital. ° °

## 2022-07-14 NOTE — ED Provider Notes (Signed)
[facility] Provider Note    Event Date/Time   First MD Initiated Contact with Patient 07/14/22 2259     (approximate)   History   Constipation (C/o constipation, pt. States last BM was 7/17, he has been taking stool softeners x4 days. Pt. Is stage 4 lung CA, states he has chemo scheduled tomorrow.  Medic states pt. Has been extra weak x several days.)   HPI  Gregory Hurley is a 76 y.o. male with history of lung cancer, hypertension, chronic kidney disease who presents to the emergency department with constipation.  Reports he has not had a bowel movement in a week but had a bowel movement in the ambulance on the way here.  He states he is feeling much better and ready to go home.  He denies any abdominal pain, vomiting.  No fever.  States he has an appointment at the [facility] at 8 AM and would like to be discharged.  He is getting chemotherapy.  He denies any bloody stools or melena.  Taking stool softeners for the past 4 days.   History provided by patient and EMS.    Past Medical History:  Diagnosis Date   Anemia    CKD (chronic kidney disease), stage III (HCC)    Elevated alkaline phosphatase level    History of fracture of clavicle    History of fracture of rib    Hypertension    Pancytopenia (HCC)    PTSD (post-traumatic stress disorder)    [facility]   Thrombocytopenia [facility])     Past Surgical History:  Procedure Laterality Date   NO PAST SURGERIES      MEDICATIONS:  Prior to Admission medications   Medication Sig Start Date End Date Taking? Authorizing Provider  acetaminophen (TYLENOL) 325 MG tablet Take 1-2 tablets (325-650 mg total) by mouth every 4 (four) hours as needed for mild pain. 06/22/18   Love, Ivan Anchors, PA-C  amLODipine (NORVASC) 10 MG tablet Take 1 tablet (10 mg total) by mouth daily. 06/22/18   Love, Ivan Anchors, PA-C  aspirin 81 MG chewable tablet Chew by mouth daily.    [provider]  bisacodyl (DULCOLAX)  10 MG suppository Place 1 suppository (10 mg total) rectally daily at 6 (six) AM. 06/23/18   Love, Ivan Anchors, PA-C  carvedilol (COREG) 3.125 MG tablet Take 1 tablet (3.125 mg total) by mouth 2 (two) times daily with a meal. 06/22/18   Love, Ivan Anchors, PA-C  cloNIDine (CATAPRES) 0.2 MG tablet Take 1 tablet (0.2 mg total) by mouth 2 (two) times daily. 06/22/18   Love, Ivan Anchors, PA-C  gabapentin (NEURONTIN) 100 MG capsule Take 1 capsule (100 mg total) by mouth at bedtime. Patient taking differently: Take 100 mg by mouth 2 (two) times daily. 06/22/18   Love, Ivan Anchors, PA-C  hydrALAZINE (APRESOLINE) 25 MG tablet Take 1 tablet (25 mg total) by mouth every 8 (eight) hours. 06/22/18   Love, Ivan Anchors, PA-C  latanoprost (XALATAN) 0.005 % ophthalmic solution Place 1 drop into both eyes at bedtime. Patient not taking: Reported on 04/27/2022    [provider]  Melatonin 3 MG TABS Take 2 tablets (6 mg total) by mouth at bedtime. IS OVER THE COUNTER. USED FOR SLEEP Patient taking differently: Take 15 mg by mouth at bedtime. IS OVER THE COUNTER. USED FOR SLEEP 06/22/18   Love, Ivan Anchors, PA-C  nicotine polacrilex (NICORETTE) 2 MG gum Take 2 mg by mouth as needed  for smoking cessation.    [provider]  PARoxetine (PAXIL) 40 MG tablet Take 0.5 tablets (20 mg total) by mouth daily. 12/09/21   Richarda Osmond, MD  polyethylene glycol [facility] Floria Raveling) packet Take 17 g by mouth daily. Patient not taking: Reported on 04/27/2022 06/23/18   Love, Ivan Anchors, PA-C  tamsulosin (FLOMAX) 0.4 MG CAPS capsule Take 0.4 mg by mouth.    [provider]  vitamin B-12 (CYANOCOBALAMIN) 1000 MCG tablet Take 1,000 mcg by mouth daily.     [provider]    Physical Exam   Triage Vital Signs: ED Triage Vitals  Enc Vitals Group     BP 07/14/22 1901 (!) 149/70     Pulse Rate 07/14/22 1901 60     Resp 07/14/22 1901 20     Temp 07/14/22 1901 98.7 F (37.1 C)     Temp Source 07/14/22 1901 Oral     SpO2  07/14/22 1901 98 %     Weight 07/14/22 1902 149 lb (67.6 kg)     Height 07/14/22 1902 '5\' 11"'$  (1.803 m)     Head Circumference --      Peak Flow --      Pain Score 07/14/22 1901 0     Pain Loc --      Pain Edu? --      Excl. in [place]? --     Most recent vital signs: Vitals:   07/14/22 2150 07/14/22 2316  BP: (!) 185/79 (!) 187/97  Pulse: 78 (!) 51  Resp: 18 18  Temp: 98.3 F (36.8 C) 98.3 F (36.8 C)  SpO2: 98% 97%    CONSTITUTIONAL: Alert and oriented and responds appropriately to questions. Well-appearing; well-nourished, elderly, in no distress HEAD: Normocephalic, atraumatic EYES: Conjunctivae clear, pupils appear equal, sclera nonicteric ENT: normal nose; moist mucous membranes NECK: Supple, normal ROM CARD: RRR; S1 and S2 appreciated; no murmurs, no clicks, no rubs, no gallops RESP: Normal chest excursion without splinting or tachypnea; breath sounds clear and equal bilaterally; no wheezes, no rhonchi, no rales, no hypoxia or respiratory distress, speaking full sentences ABD/GI: Normal bowel sounds; non-distended; soft, non-tender, no rebound, no guarding, no peritoneal signs BACK: The back appears normal EXT: Normal ROM in all joints; no deformity noted, no edema; no cyanosis SKIN: Normal color for age and race; warm; no rash on exposed skin NEURO: Moves all extremities equally, normal speech PSYCH: The patient's mood and manner are appropriate.   ED Results / Procedures / Treatments   LABS: (all labs ordered are listed, but only abnormal results are displayed) Labs Reviewed  CBC WITH DIFFERENTIAL/PLATELET - Abnormal; Notable for the following components:      Result Value   RBC 3.68 (*)    Hemoglobin 8.8 (*)    HCT 27.6 (*)    MCV 75.0 (*)    MCH 23.9 (*)    Platelets 133 (*)    All other components within normal limits  BASIC METABOLIC PANEL - Abnormal; Notable for the following components:   Potassium 3.2 (*)    Glucose, Bld 106 (*)    BUN 29 (*)     Creatinine, Ser 1.77 (*)    Calcium 8.3 (*)    GFR, Estimated 40 (*)    All other components within normal limits     EKG:   RADIOLOGY: My personal review and interpretation of imaging:    I have personally reviewed all radiology reports.   No results found.  PROCEDURES:  Critical Care performed: No    Procedures    IMPRESSION / MDM / ASSESSMENT AND PLAN / ED COURSE  I reviewed the triage vital signs and the nursing notes.    Patient here for constipation.  Had a bowel movement in route in the ambulance reports feeling better and is ready for discharge home.     DIFFERENTIAL DIAGNOSIS (includes but not limited to):   Constipation, doubt bowel obstruction, colitis, diverticulitis, appendicitis   Patient's presentation is most consistent with acute presentation with potential threat to life or bodily function.   PLAN: CBC, BMP obtained from triage.  Patient declines any further work-up and states he would like to go home.   MEDICATIONS GIVEN IN ED: Medications - No data to display   ED COURSE: Patient's labs show hemoglobin of 8.8 which is down from 9.9 in December 2022.  He denies any symptoms of GI bleed.  This is likely from being on chemotherapy.  He has chronic thrombocytopenia and this appears stable.  He also has a slight bump in his creatinine of 1.77.  Was 1.41 in December 2022.  Have recommended increase fluid intake at home, avoiding NSAIDs and he can have this rechecked by his doctor.  I have printed a copy of all of his test results that he can take it to the [place] in the morning.  Patient would like discharge home.  Have recommended stool softeners daily to prevent constipation as this seemed to eventually help him have a successful bowel movement.  At this time, I do not feel there is any life-threatening condition present. I reviewed all nursing notes, vitals, pertinent previous records.  All lab and urine results, EKGs, imaging ordered have been  independently reviewed and interpreted by myself.  I reviewed all available radiology reports from any imaging ordered this visit.  Based on my assessment, I feel the patient is safe to be discharged home without further emergent workup and can continue workup as an outpatient as needed. Discussed all findings, treatment plan as well as usual and customary return precautions.  They verbalize understanding and are comfortable with this plan.  Outpatient follow-up has been provided as needed.  All questions have been answered.    CONSULTS: No admission needed at this time.  Patient feeling better, having bowel movements, abdominal exam is benign, tolerating p.o.   OUTSIDE RECORDS REVIEWED: Reviewed patient's last family medicine note with Carmell Austria on 06/12/2019.       FINAL CLINICAL IMPRESSION(S) / ED DIAGNOSES   Final diagnoses:  Constipation, unspecified constipation type     Rx / DC Orders   ED Discharge Orders          Ordered    docusate sodium (COLACE) 100 MG capsule  2 times daily        07/14/22 2321             Note:  This document was prepared using Dragon voice recognition software and may include unintentional dictation errors.   Nastasia Kage, Delice Bison, DO 07/15/22 [phone]

## 2023-04-18 ENCOUNTER — Emergency Department: Payer: No Typology Code available for payment source

## 2023-04-18 ENCOUNTER — Other Ambulatory Visit: Payer: Self-pay

## 2023-04-18 ENCOUNTER — Inpatient Hospital Stay
Admission: EM | Admit: 2023-04-18 | Discharge: 2023-04-19 | DRG: 812 | Disposition: A | Discharge status: Home / Self Care | Payer: No Typology Code available for payment source | Attending: Internal Medicine | Admitting: Internal Medicine

## 2023-04-18 DIAGNOSIS — Z800 Family history of malignant neoplasm of digestive organs: Secondary | ICD-10-CM | POA: Diagnosis not present

## 2023-04-18 DIAGNOSIS — F4310 Post-traumatic stress disorder, unspecified: Secondary | ICD-10-CM | POA: Diagnosis present

## 2023-04-18 DIAGNOSIS — Z85118 Personal history of other malignant neoplasm of bronchus and lung: Secondary | ICD-10-CM | POA: Diagnosis not present

## 2023-04-18 DIAGNOSIS — Z7982 Long term (current) use of aspirin: Secondary | ICD-10-CM

## 2023-04-18 DIAGNOSIS — Z888 Allergy status to other drugs, medicaments and biological substances status: Secondary | ICD-10-CM | POA: Diagnosis not present

## 2023-04-18 DIAGNOSIS — I959 Hypotension, unspecified: Secondary | ICD-10-CM | POA: Diagnosis not present

## 2023-04-18 DIAGNOSIS — Z87891 Personal history of nicotine dependence: Secondary | ICD-10-CM | POA: Diagnosis not present

## 2023-04-18 DIAGNOSIS — Z79899 Other long term (current) drug therapy: Secondary | ICD-10-CM | POA: Diagnosis not present

## 2023-04-18 DIAGNOSIS — Z803 Family history of malignant neoplasm of breast: Secondary | ICD-10-CM

## 2023-04-18 DIAGNOSIS — R58 Hemorrhage, not elsewhere classified: Secondary | ICD-10-CM | POA: Diagnosis not present

## 2023-04-18 DIAGNOSIS — N400 Enlarged prostate without lower urinary tract symptoms: Secondary | ICD-10-CM | POA: Diagnosis present

## 2023-04-18 DIAGNOSIS — Z1152 Encounter for screening for COVID-19: Secondary | ICD-10-CM

## 2023-04-18 DIAGNOSIS — I469 Cardiac arrest, cause unspecified: Secondary | ICD-10-CM | POA: Diagnosis not present

## 2023-04-18 DIAGNOSIS — N189 Chronic kidney disease, unspecified: Secondary | ICD-10-CM | POA: Diagnosis present

## 2023-04-18 DIAGNOSIS — N390 Urinary tract infection, site not specified: Secondary | ICD-10-CM | POA: Diagnosis present

## 2023-04-18 DIAGNOSIS — I129 Hypertensive chronic kidney disease with stage 1 through stage 4 chronic kidney disease, or unspecified chronic kidney disease: Secondary | ICD-10-CM | POA: Diagnosis present

## 2023-04-18 DIAGNOSIS — D649 Anemia, unspecified: Secondary | ICD-10-CM | POA: Diagnosis present

## 2023-04-18 DIAGNOSIS — Z8042 Family history of malignant neoplasm of prostate: Secondary | ICD-10-CM | POA: Diagnosis not present

## 2023-04-18 DIAGNOSIS — D61818 Other pancytopenia: Secondary | ICD-10-CM | POA: Diagnosis present

## 2023-04-18 DIAGNOSIS — D509 Iron deficiency anemia, unspecified: Principal | ICD-10-CM | POA: Diagnosis present

## 2023-04-18 DIAGNOSIS — R310 Gross hematuria: Principal | ICD-10-CM

## 2023-04-18 HISTORY — DX: Malignant (primary) neoplasm, unspecified: C80.1

## 2023-04-18 LAB — PREPARE RBC (CROSSMATCH)

## 2023-04-18 LAB — CBC
HCT: 21.1 % — ABNORMAL LOW (ref 39.0–52.0)
Hemoglobin: 6.4 g/dL — ABNORMAL LOW (ref 13.0–17.0)
MCH: 22 pg — ABNORMAL LOW (ref 26.0–34.0)
MCHC: 30.3 g/dL (ref 30.0–36.0)
MCV: 72.5 fL — ABNORMAL LOW (ref 80.0–100.0)
Platelets: 118 10*3/uL — ABNORMAL LOW (ref 150–400)
RBC: 2.91 MIL/uL — ABNORMAL LOW (ref 4.22–5.81)
RDW: 22.8 % — ABNORMAL HIGH (ref 11.5–15.5)
WBC: 3.2 10*3/uL — ABNORMAL LOW (ref 4.0–10.5)
nRBC: 0 % (ref 0.0–0.2)

## 2023-04-18 LAB — BASIC METABOLIC PANEL
Anion gap: 7 (ref 5–15)
BUN: 18 mg/dL (ref 8–23)
CO2: 25 mmol/L (ref 22–32)
Calcium: 7.9 mg/dL — ABNORMAL LOW (ref 8.9–10.3)
Chloride: 104 mmol/L (ref 98–111)
Creatinine, Ser: 1.16 mg/dL (ref 0.61–1.24)
GFR, Estimated: >60 mL/min (ref >60)
Glucose, Bld: 107 mg/dL — ABNORMAL HIGH (ref 70–99)
Potassium: 3.4 mmol/L — ABNORMAL LOW (ref 3.5–5.1)
Sodium: 136 mmol/L (ref 135–145)

## 2023-04-18 LAB — ABO/RH: ABO/RH(D): O POS

## 2023-04-18 LAB — URINALYSIS, ROUTINE W REFLEX MICROSCOPIC
Bilirubin Urine: NEGATIVE
Glucose, UA: NEGATIVE mg/dL
Ketones, ur: NEGATIVE mg/dL
Nitrite: NEGATIVE
Protein, ur: NEGATIVE mg/dL
RBC / HPF: >50 RBC/hpf (ref 0–5)
Specific Gravity, Urine: 1.01 (ref 1.005–1.030)
pH: 5 (ref 5.0–8.0)

## 2023-04-18 LAB — IRON AND TIBC
Iron: 13 ug/dL — ABNORMAL LOW (ref 45–182)
Saturation Ratios: 6 % — ABNORMAL LOW (ref 17.9–39.5)
TIBC: 202 ug/dL — ABNORMAL LOW (ref 250–450)
UIBC: 189 ug/dL

## 2023-04-18 LAB — FOLATE: Folate: 7.5 ng/mL (ref >5.9)

## 2023-04-18 LAB — FERRITIN: Ferritin: 72 ng/mL (ref 24–336)

## 2023-04-18 MED ORDER — MORPHINE SULFATE (PF) 2 MG/ML IV SOLN: 1.0000 mg | Freq: Four times a day (QID) | INTRAVENOUS | Status: DC | PRN

## 2023-04-18 MED ORDER — ASPIRIN 81 MG PO CHEW
81.0000 mg | CHEWABLE_TABLET | Freq: Every day | ORAL | Status: DC
  Administered 2023-04-19: 81 mg via ORAL
  Filled 2023-04-18: qty 1

## 2023-04-18 MED ORDER — HYDROCODONE-ACETAMINOPHEN 5-325 MG PO TABS: 1.0000 | ORAL_TABLET | ORAL | Status: DC | PRN

## 2023-04-18 MED ORDER — CLONIDINE HCL 0.1 MG PO TABS
0.2000 mg | ORAL_TABLET | Freq: Two times a day (BID) | ORAL | Status: DC
  Administered 2023-04-19: 0.2 mg via ORAL
  Filled 2023-04-18: qty 2

## 2023-04-18 MED ORDER — TRAZODONE HCL 50 MG PO TABS: 25.0000 mg | ORAL_TABLET | Freq: Every evening | ORAL | Status: DC | PRN

## 2023-04-18 MED ORDER — SODIUM CHLORIDE 0.9 % IV SOLN
1.0000 g | Freq: Once | INTRAVENOUS | Status: AC
  Administered 2023-04-18: 1 g via INTRAVENOUS
  Filled 2023-04-18: qty 10

## 2023-04-18 MED ORDER — SODIUM CHLORIDE 0.9 % IV SOLN: 10.0000 mL/h | Freq: Once | INTRAVENOUS | Status: DC

## 2023-04-18 MED ORDER — SODIUM CHLORIDE 0.9% FLUSH
3.0000 mL | Freq: Two times a day (BID) | INTRAVENOUS | Status: DC
  Administered 2023-04-19 (×2): 3 mL via INTRAVENOUS

## 2023-04-18 MED ORDER — TAMSULOSIN HCL 0.4 MG PO CAPS: 0.4000 mg | ORAL_CAPSULE | Freq: Every evening | ORAL | Status: DC

## 2023-04-18 MED ORDER — VITAMIN B-12 1000 MCG PO TABS
1000.0000 ug | ORAL_TABLET | Freq: Every day | ORAL | Status: DC
  Administered 2023-04-19: 1000 ug via ORAL
  Filled 2023-04-18: qty 1

## 2023-04-18 MED ORDER — SODIUM CHLORIDE 0.9 % IV SOLN
1.0000 g | INTRAVENOUS | Status: DC
  Filled 2023-04-18: qty 10

## 2023-04-18 MED ORDER — AMLODIPINE BESYLATE 10 MG PO TABS
5.0000 mg | ORAL_TABLET | Freq: Every day | ORAL | Status: DC
  Administered 2023-04-19: 5 mg via ORAL
  Filled 2023-04-18: qty 1

## 2023-04-18 MED ORDER — BISACODYL 5 MG PO TBEC: 5.0000 mg | DELAYED_RELEASE_TABLET | Freq: Every day | ORAL | Status: DC | PRN

## 2023-04-18 MED ORDER — PAROXETINE HCL 20 MG PO TABS
20.0000 mg | ORAL_TABLET | Freq: Every day | ORAL | Status: DC
  Administered 2023-04-19: 20 mg via ORAL
  Filled 2023-04-18: qty 1

## 2023-04-18 MED ORDER — GABAPENTIN 100 MG PO CAPS
100.0000 mg | ORAL_CAPSULE | Freq: Two times a day (BID) | ORAL | Status: DC
  Administered 2023-04-19: 100 mg via ORAL
  Filled 2023-04-18: qty 1

## 2023-04-18 MED ORDER — ONDANSETRON HCL 4 MG PO TABS: 4.0000 mg | ORAL_TABLET | Freq: Four times a day (QID) | ORAL | Status: DC | PRN

## 2023-04-18 MED ORDER — SODIUM CHLORIDE 0.9% IV SOLUTION: Freq: Once | INTRAVENOUS | Status: DC

## 2023-04-18 MED ORDER — ACETAMINOPHEN 325 MG PO TABS
650.0000 mg | ORAL_TABLET | Freq: Four times a day (QID) | ORAL | Status: DC | PRN
  Administered 2023-04-19: 650 mg via ORAL
  Filled 2023-04-18: qty 2

## 2023-04-18 MED ORDER — ACETAMINOPHEN 650 MG RE SUPP: 650.0000 mg | Freq: Four times a day (QID) | RECTAL | Status: DC | PRN

## 2023-04-18 MED ORDER — BISACODYL 10 MG RE SUPP: 10.0000 mg | Freq: Every day | RECTAL | Status: DC | PRN

## 2023-04-18 MED ORDER — SODIUM CHLORIDE 0.9 % IV SOLN: INTRAVENOUS | Status: DC

## 2023-04-18 MED ORDER — CARVEDILOL 3.125 MG PO TABS: 3.1250 mg | ORAL_TABLET | Freq: Two times a day (BID) | ORAL | Status: DC

## 2023-04-18 MED ORDER — HYDRALAZINE HCL 25 MG PO TABS: 25.0000 mg | ORAL_TABLET | Freq: Three times a day (TID) | ORAL | Status: DC

## 2023-04-18 MED ORDER — ONDANSETRON HCL 4 MG/2ML IJ SOLN: 4.0000 mg | Freq: Four times a day (QID) | INTRAMUSCULAR | Status: DC | PRN

## 2023-04-18 MED ORDER — NICOTINE POLACRILEX 2 MG MT GUM: 2.0000 mg | CHEWING_GUM | OROMUCOSAL | Status: DC | PRN

## 2023-04-18 MED ORDER — MELATONIN 5 MG PO TABS
15.0000 mg | ORAL_TABLET | Freq: Every day | ORAL | Status: DC
  Administered 2023-04-19: 15 mg via ORAL
  Filled 2023-04-18: qty 3

## 2023-04-18 MED ORDER — SENNOSIDES-DOCUSATE SODIUM 8.6-50 MG PO TABS: 1.0000 | ORAL_TABLET | Freq: Every evening | ORAL | Status: DC | PRN

## 2023-04-18 MED ORDER — DOCUSATE SODIUM 100 MG PO CAPS
100.0000 mg | ORAL_CAPSULE | Freq: Two times a day (BID) | ORAL | Status: DC
  Administered 2023-04-19: 100 mg via ORAL
  Filled 2023-04-18: qty 1

## 2023-04-18 MED ORDER — HYDRALAZINE HCL 20 MG/ML IJ SOLN
5.0000 mg | Freq: Three times a day (TID) | INTRAMUSCULAR | Status: DC | PRN
  Administered 2023-04-19 (×2): 5 mg via INTRAVENOUS
  Filled 2023-04-18 (×2): qty 1

## 2023-04-18 NOTE — ED Notes (Signed)
Unsuccessful lab draw attempt. Lab contacted

## 2023-04-18 NOTE — ED Provider Notes (Signed)
[facility] Provider Note    Event Date/Time   First MD Initiated Contact with Patient 04/18/23 1552     (approximate)   History   Hematuria   HPI  Kainoa Swoboda is a 77 y.o. male  with a history of lung cancer on hospice, hypertension, and CKD who presents with hematuria, acute onset today, described as bright red blood in the urine.  The patient denies any abdominal or flank pain.  He has no fever or chills.  He states this has not happened before.  He denies any other abnormal bleeding.  I reviewed the past medical records.  The patient was most recently seen in the ED in July of last year with constipation.  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  I attempted to call the patient's daughter Rosey Bath, who the patient reports is his POA, to obtain additional history and determine the plan of care, but I was not able to get in touch with her.   Physical Exam   Triage Vital Signs: ED Triage Vitals  Enc Vitals Group     BP 04/18/23 1446 (!) 139/54     Pulse Rate 04/18/23 1446 (!) 58     Resp 04/18/23 1446 18     Temp 04/18/23 1446 98.5 F (36.9 C)     Temp src --      SpO2 04/18/23 1446 99 %     Weight 04/18/23 1445 145 lb (65.8 kg)     Height 04/18/23 1445 5\' 11"  (1.803 m)     Head Circumference --      Peak Flow --      Pain Score 04/18/23 1445 0     Pain Loc --      Pain Edu? --      Excl. in [place]? --     Most recent vital signs: Vitals:   04/18/23 2238 04/18/23 2321  BP: (!) 194/81 (!) 191/70  Pulse: 63 (!) 59  Resp:  16  Temp:  98.7 F (37.1 C)  SpO2: 100% 98%     General: Alert, weak appearing but in no distress.  CV:  Good peripheral perfusion.  Resp:  Normal effort.  Abd:  Soft and nontender.  No distention.  Other:  Conjunctival pallor.   ED Results / Procedures / Treatments   Labs (all labs ordered are listed, but only abnormal results are displayed) Labs Reviewed  CBC - Abnormal; Notable  for the following components:      Result Value   WBC 3.2 (*)    RBC 2.91 (*)    Hemoglobin 6.4 (*)    HCT 21.1 (*)    MCV 72.5 (*)    MCH 22.0 (*)    RDW 22.8 (*)    Platelets 118 (*)    All other components within normal limits  BASIC METABOLIC PANEL - Abnormal; Notable for the following components:   Potassium 3.4 (*)    Glucose, Bld 107 (*)    Calcium 7.9 (*)    All other components within normal limits  URINALYSIS, ROUTINE W REFLEX MICROSCOPIC - Abnormal; Notable for the following components:   Color, Urine YELLOW (*)    APPearance HAZY (*)    Hgb urine dipstick LARGE (*)    Leukocytes,Ua TRACE (*)    Bacteria, UA RARE (*)    All other components within normal limits  VITAMIN B12  FOLATE  COMPREHENSIVE METABOLIC PANEL  CBC  OCCULT  BLOOD X 1 CARD TO LAB, STOOL  FERRITIN  IRON AND TIBC  PREPARE RBC (CROSSMATCH)  ABO/RH  TYPE AND SCREEN  PREPARE RBC (CROSSMATCH)     EKG     RADIOLOGY  CT abdomen/pelvis: I independently viewed and interpreted the images; there are no ureteral stones and no bladder mass  IMPRESSION:  1. No acute findings. No urolithiasis or hydronephrosis.  2. Loculated right pleural effusion with pleural thickening.  3. Small volume pelvic ascites.  4. Aortic Atherosclerosis (ICD10-I70.0) and Emphysema (ICD10-J43.9).     PROCEDURES:  Critical Care performed: No  Procedures   MEDICATIONS ORDERED IN ED: Medications  0.9 %  sodium chloride infusion (has no administration in time range)  aspirin chewable tablet 81 mg (has no administration in time range)  amLODipine (NORVASC) tablet 10 mg (has no administration in time range)  carvedilol (COREG) tablet 3.125 mg (has no administration in time range)  cloNIDine (CATAPRES) tablet 0.2 mg (has no administration in time range)  hydrALAZINE (APRESOLINE) tablet 25 mg (has no administration in time range)  nicotine polacrilex (NICORETTE) gum 2 mg (has no administration in time range)   PARoxetine (PAXIL) tablet 20 mg (has no administration in time range)  bisacodyl (DULCOLAX) suppository 10 mg (has no administration in time range)  docusate sodium (COLACE) capsule 100 mg (has no administration in time range)  tamsulosin (FLOMAX) capsule 0.4 mg (has no administration in time range)  cyanocobalamin (VITAMIN B12) tablet 1,000 mcg (has no administration in time range)  melatonin tablet 15 mg (has no administration in time range)  gabapentin (NEURONTIN) capsule 100 mg (has no administration in time range)  sodium chloride flush (NS) 0.9 % injection 3 mL (has no administration in time range)  0.9 %  sodium chloride infusion (has no administration in time range)  acetaminophen (TYLENOL) tablet 650 mg (has no administration in time range)    Or  acetaminophen (TYLENOL) suppository 650 mg (has no administration in time range)  HYDROcodone-acetaminophen (NORCO/VICODIN) 5-325 MG per tablet 1-2 tablet (has no administration in time range)  morphine (PF) 2 MG/ML injection 1 mg (has no administration in time range)  traZODone (DESYREL) tablet 25 mg (has no administration in time range)  senna-docusate (Senokot-S) tablet 1 tablet (has no administration in time range)  bisacodyl (DULCOLAX) EC tablet 5 mg (has no administration in time range)  ondansetron (ZOFRAN) tablet 4 mg (has no administration in time range)    Or  ondansetron (ZOFRAN) injection 4 mg (has no administration in time range)  hydrALAZINE (APRESOLINE) injection 5 mg (has no administration in time range)  0.9 %  sodium chloride infusion (Manually program via Guardrails IV Fluids) (has no administration in time range)  cefTRIAXone (ROCEPHIN) 1 g in sodium chloride 0.9 % 100 mL IVPB (has no administration in time range)  cefTRIAXone (ROCEPHIN) 1 g in sodium chloride 0.9 % 100 mL IVPB (0 g Intravenous Stopped 04/18/23 2043)     IMPRESSION / MDM / ASSESSMENT AND PLAN / ED COURSE  I reviewed the triage vital signs and the  nursing notes.  77 year old male with PMH as noted above presents with acute onset of gross hematuria today.  He denies other acute symptoms.  On exam he is comfortable appearing.  His vital signs are normal.  There are no other focal exam findings.  Differential diagnosis includes, but is not limited to, UTI, bladder mass, other acute urologic etiology.  The patient is not on any anticoagulation.  Initial labs reveal a hemoglobin  of 6.4.  MCV is low.  Electrolytes are unremarkable except for low calcium.  We will obtain urinalysis, CT abdomen, and reassess.  Patient's presentation is most consistent with acute presentation with potential threat to life or bodily function.  ----------------------------------------- 8:24 PM on 04/18/2023 -----------------------------------------  Urinalysis shows findings consistent with a UTI.  CT is negative for acute findings.  I ordered a unit of PRBCs as well as ceftriaxone.  Based on discussion with the patient about his goals of care we will admit for further management of these reversible etiologies of his presentation.  I consulted Dr. Emmit Pomfret from the hospitalist service; based on our discussion she agrees to evaluate the patient for admission.  FINAL CLINICAL IMPRESSION(S) / ED DIAGNOSES   Final diagnoses:  Gross hematuria  Anemia, unspecified type     Rx / DC Orders   ED Discharge Orders     None        Note:  This document was prepared using Dragon voice recognition software and may include unintentional dictation errors.    Dionne Bucy, MD 04/18/23 [phone]

## 2023-04-18 NOTE — ED Notes (Signed)
Primofit placed on pt per pt request

## 2023-04-18 NOTE — ED Triage Notes (Signed)
Pt to ED [facility] from home for hematuria. Lung cancer pt, no chemo or radiation. Denies pain

## 2023-04-18 NOTE — Progress Notes (Signed)
[facility]- Civil engineer, contracting [facility])       This patient is a current hospice patient with [facility], admitted with a terminal diagnosis of Lung Cancer.    [facility] will continue to follow for any discharge planning needs and to coordinate continuation of hospice care.   Please don't hesitate to call with any Hospice related questions or concerns.    Thank you for the opportunity to participate in this  patient's care.  [facility] Liaison [phone]

## 2023-04-18 NOTE — H&P (Signed)
History and Physical   [facility] [place] @ [facility] Admission History and Physical [employer], D.O.    Patient Name: Gregory Hurley MR#: [id number] Date of Birth: [birth year] Date of Admission: 04/18/2023  Referring MD/NP/PA: Dr. Marisa Severin Primary Care Physician: [facility]  Chief Complaint:  Chief Complaint  Patient presents with   Hematuria    HPI: Gregory Hurley is a 77 y.o. male with a known history of lung cancer on hospice, CKD stage III, pancytopenia, hypertension, PTSD presents to the emergency department for evaluation of hematuria.  Patient was in a usual state of health until today when he reports the onset of bloody urine.  He denies any dysuria, frequency, fevers or chills.  His only other complaint is bilateral lower extremity edema which is new for him..  Patient denies fevers/chills, weakness, dizziness, chest pain, shortness of breath, N/V/C/D, abdominal pain, dysuria/frequency, changes in mental status.    Otherwise there has been no change in status. Patient has been taking medication as prescribed and there has been no recent change in medication or diet.  No recent antibiotics.  There has been no recent illness, hospitalizations, travel or sick contacts.    EMS/ED Course: Medical admission has been requested for further management of symptomatic anemia, hematuria likely secondary to urinary tract infection.  Review of Systems:  CONSTITUTIONAL: Positive fatigue which is at baseline.  No fever/chills,  weakness, weight gain/loss, headache. EYES: No blurry or double vision. ENT: No tinnitus, postnasal drip, redness or soreness of the oropharynx. RESPIRATORY: No cough, dyspnea, wheeze.  No hemoptysis.  CARDIOVASCULAR: No chest pain, palpitations, syncope, orthopnea.  Positive lower extremity edema.  GASTROINTESTINAL: No nausea, vomiting, abdominal pain, diarrhea, constipation.  No hematemesis, melena or hematochezia. GENITOURINARY: Positive  hematuria.  No dysuria, frequency,  ENDOCRINE: No polyuria or nocturia. No heat or cold intolerance. HEMATOLOGY: No anemia, bruising, bleeding. INTEGUMENTARY: No rashes, ulcers, lesions. MUSCULOSKELETAL: No arthritis, gout. NEUROLOGIC: No numbness, tingling, ataxia, seizure-type activity, weakness. PSYCHIATRIC: No anxiety, depression, insomnia.   Past Medical History:  Diagnosis Date   Anemia    Cancer (HCC)    CKD (chronic kidney disease), stage III (HCC)    Elevated alkaline phosphatase level    History of fracture of clavicle    History of fracture of rib    Hypertension    Pancytopenia (HCC)    PTSD (post-traumatic stress disorder)    [facility]   Thrombocytopenia (HCC)     Past Surgical History:  Procedure Laterality Date   NO PAST SURGERIES       reports that he quit smoking about 22 months ago. His smoking use included cigars and cigarettes. He has a 50.00 pack-year smoking history. He has never used smokeless tobacco. He reports current alcohol use. He reports current drug use.  Allergies  Allergen Reactions   Ace Inhibitors     Other reaction(s): Angioedema    Family History  Problem Relation Age of Onset   Breast cancer Mother    Colon cancer Father    Prostate cancer Father     Prior to Admission medications   Medication Sig Start Date End Date Taking? Authorizing Provider  acetaminophen (TYLENOL) 325 MG tablet Take 1-2 tablets (325-650 mg total) by mouth every 4 (four) hours as needed for mild pain. 06/22/18   Love, Evlyn Kanner, PA-C  amLODipine (NORVASC) 10 MG tablet Take 1 tablet (10 mg total) by mouth daily. 06/22/18   Jacquelynn Cree, PA-C  aspirin 81  MG chewable tablet Chew by mouth daily.    [provider]  bisacodyl (DULCOLAX) 10 MG suppository Place 1 suppository (10 mg total) rectally daily at 6 (six) AM. 06/23/18   Love, Evlyn Kanner, PA-C  carvedilol (COREG) 3.125 MG tablet Take 1 tablet (3.125 mg total) by mouth 2 (two) times daily with  a meal. 06/22/18   Love, Evlyn Kanner, PA-C  cloNIDine (CATAPRES) 0.2 MG tablet Take 1 tablet (0.2 mg total) by mouth 2 (two) times daily. 06/22/18   Love, Evlyn Kanner, PA-C  docusate sodium (COLACE) 100 MG capsule Take 1 capsule (100 mg total) by mouth 2 (two) times daily. 07/14/22 07/14/23  Ward, Layla Maw, DO  gabapentin (NEURONTIN) 100 MG capsule Take 1 capsule (100 mg total) by mouth at bedtime. Patient taking differently: Take 100 mg by mouth 2 (two) times daily. 06/22/18   Love, Evlyn Kanner, PA-C  hydrALAZINE (APRESOLINE) 25 MG tablet Take 1 tablet (25 mg total) by mouth every 8 (eight) hours. 06/22/18   Love, Evlyn Kanner, PA-C  latanoprost (XALATAN) 0.005 % ophthalmic solution Place 1 drop into both eyes at bedtime. Patient not taking: Reported on 04/27/2022    [provider]  Melatonin 3 MG TABS Take 2 tablets (6 mg total) by mouth at bedtime. IS OVER THE COUNTER. USED FOR SLEEP Patient taking differently: Take 15 mg by mouth at bedtime. IS OVER THE COUNTER. USED FOR SLEEP 06/22/18   Love, Evlyn Kanner, PA-C  nicotine polacrilex (NICORETTE) 2 MG gum Take 2 mg by mouth as needed for smoking cessation.    [provider]  PARoxetine (PAXIL) 40 MG tablet Take 0.5 tablets (20 mg total) by mouth daily. 12/09/21   Leeroy Bock, MD  polyethylene glycol [facility] / Ethelene Hal) packet Take 17 g by mouth daily. Patient not taking: Reported on 04/27/2022 06/23/18   Love, Evlyn Kanner, PA-C  tamsulosin (FLOMAX) 0.4 MG CAPS capsule Take 0.4 mg by mouth.    [provider]  vitamin B-12 (CYANOCOBALAMIN) 1000 MCG tablet Take 1,000 mcg by mouth daily.     [provider]    Physical Exam: Vitals:   04/18/23 1445 04/18/23 1446 04/18/23 1910 04/18/23 2046  BP:  (!) 139/54 (!) 166/78 (!) 189/93  Pulse:  (!) 58 65 65  Resp:  18 15 12   Temp:  98.5 F (36.9 C)  98.5 F (36.9 C)  TempSrc:    Oral  SpO2:  99% 100% 99%  Weight: 65.8 kg     Height: 5\' 11"  (1.803 m)       GENERAL: 77 y.o.-year-old  black male patient, well-developed, well-nourished lying in the bed in no acute distress.  Pleasant and cooperative.   HEENT: Head atraumatic, normocephalic. Pupils equal. Mucus membranes moist.  Poor dentition. NECK: Supple. No JVD. CHEST: Normal breath sounds bilaterally. No wheezing, rales, rhonchi or crackles. No use of accessory muscles of respiration.  No reproducible chest wall tenderness.  CARDIOVASCULAR: S1, S2 normal. No murmurs, rubs, or gallops. Cap refill <2 seconds. Pulses intact distally.  ABDOMEN: Soft, nondistended, nontender. No rebound, guarding, rigidity. Normoactive bowel sounds present in all four quadrants.  EXTREMITIES: Positive bilateral pitting edema.  To mid calf.  No calf tenderness or Homan's sign.  NEUROLOGIC: The patient is alert and oriented x 3. Cranial nerves II through XII are grossly intact with no focal sensorimotor deficit. PSYCHIATRIC:  Normal affect, mood, thought content. SKIN: Warm, dry, and intact without obvious rash, lesion, or ulcer.    Labs  on Admission:  CBC: Recent Labs  Lab 04/18/23 1527  WBC 3.2*  HGB 6.4*  HCT 21.1*  MCV 72.5*  PLT 118*   Basic Metabolic Panel: Recent Labs  Lab 04/18/23 1527  NA 136  K 3.4*  CL 104  CO2 25  GLUCOSE 107*  BUN 18  CREATININE 1.16  CALCIUM 7.9*   GFR: Estimated Creatinine Clearance: 51.2 mL/min (by C-G formula based on SCr of 1.16 mg/dL). Liver Function Tests: No results for input(s): "AST", "ALT", "ALKPHOS", "BILITOT", "PROT", "ALBUMIN" in the last 168 hours. No results for input(s): "LIPASE", "AMYLASE" in the last 168 hours. No results for input(s): "AMMONIA" in the last 168 hours. Coagulation Profile: No results for input(s): "INR", "PROTIME" in the last 168 hours. Cardiac Enzymes: No results for input(s): "CKTOTAL", "CKMB", "CKMBINDEX", "TROPONINI" in the last 168 hours. BNP (last 3 results) No results for input(s): "PROBNP" in the last 8760 hours. HbA1C: No results for input(s):  "HGBA1C" in the last 72 hours. CBG: No results for input(s): "GLUCAP" in the last 168 hours. Lipid Profile: No results for input(s): "CHOL", "HDL", "LDLCALC", "TRIG", "CHOLHDL", "LDLDIRECT" in the last 72 hours. Thyroid Function Tests: No results for input(s): "TSH", "T4TOTAL", "FREET4", "T3FREE", "THYROIDAB" in the last 72 hours. Anemia Panel: No results for input(s): "VITAMINB12", "FOLATE", "FERRITIN", "TIBC", "IRON", "RETICCTPCT" in the last 72 hours. Urine analysis:    Component Value Date/Time   COLORURINE YELLOW (A) 04/18/2023 1706   APPEARANCEUR HAZY (A) 04/18/2023 1706   APPEARANCEUR Cloudy (A) 08/21/2019 1142   LABSPEC 1.010 04/18/2023 1706   LABSPEC 1.015 01/04/2014 1449   PHURINE 5.0 04/18/2023 1706   GLUCOSEU NEGATIVE 04/18/2023 1706   GLUCOSEU Negative 01/04/2014 1449   HGBUR LARGE (A) 04/18/2023 1706   BILIRUBINUR NEGATIVE 04/18/2023 1706   BILIRUBINUR Negative 08/21/2019 1142   BILIRUBINUR Negative 01/04/2014 1449   KETONESUR NEGATIVE 04/18/2023 1706   PROTEINUR NEGATIVE 04/18/2023 1706   NITRITE NEGATIVE 04/18/2023 1706   LEUKOCYTESUR TRACE (A) 04/18/2023 1706   LEUKOCYTESUR Negative 01/04/2014 1449   Sepsis Labs: @LABRCNTIP (procalcitonin:4,lacticidven:4) )No results found for this or any previous visit (from the past 240 hour(s)).   Radiological Exams on Admission: CT ABDOMEN PELVIS WO CONTRAST  Result Date: 04/18/2023 CLINICAL DATA:  Hematuria, history of lung carcinoma EXAM: CT ABDOMEN AND PELVIS WITHOUT CONTRAST TECHNIQUE: Multidetector CT imaging of the abdomen and pelvis was performed following the standard protocol without IV contrast. RADIATION DOSE REDUCTION: This exam was performed according to the departmental dose-optimization program which includes automated exposure control, adjustment of the mA and/or kV according to patient size and/or use of iterative reconstruction technique. COMPARISON:  06/30/2020 FINDINGS: Lower chest: Loculated right pleural  effusion with pleural thickening. Postop changes and parenchymal scarring/consolidation in the right hilum and lateral right lower lung. Pulmonary emphysema. Blood pool is hypodense compared to the interventricular septum suggesting anemia. Hepatobiliary: No focal liver abnormality is seen. No gallstones, gallbladder wall thickening, or biliary dilatation. Pancreas: Unremarkable. No pancreatic ductal dilatation or surrounding inflammatory changes. Spleen: Normal in size without focal abnormality. Adrenals/Urinary Tract: No adrenal mass. Symmetric renal contours. No urolithiasis or hydronephrosis. Urinary bladder incompletely distended with circumferential wall thickening. Stomach/Bowel: Stomach is nondistended, unremarkable. Small bowel decompressed. Moderate colonic fecal material without dilatation. Vascular/Lymphatic: Scattered aortoiliac calcified plaque without aneurysm. No abdominal or pelvic adenopathy. Reproductive: Prostate is unremarkable. Other: Small volume pelvic ascites. No abdominal ascites. No free air. Musculoskeletal: Flowing osteophytes through the thoracolumbar spine. AVN in bilateral femoral heads without subchondral collapse. No acute findings.  IMPRESSION: 1. No acute findings. No urolithiasis or hydronephrosis. 2. Loculated right pleural effusion with pleural thickening. 3. Small volume pelvic ascites. 4. Aortic Atherosclerosis (ICD10-I70.0) and Emphysema (ICD10-J43.9). Electronically Signed   By: Corlis Leak M.D.   On: 04/18/2023 17:36    Assessment/Plan  This is a 77 y.o. male with a history of lung cancer on hospice, CKD 3, hypertension, pancytopenia, PTSD now being admitted with:  #.  Symptomatic anemia, microcytic in the setting of chronic pancytopenia - Admit inpatient - Transfuse 2 units packed red cells - Trend CBC -Check iron levels, FOBT, B12 and folate  #.  Gross hematuria likely secondary to urinary tract infection - Continue IV Rocephin - Continue IV fluids  #.   History of hypertension - Continue amlodipine, Coreg, Catapres, hydralazine  #. History of PTSD - Continue Paxil  #. History of BPH - Continue Flomax  #. History of lung cancer -Please contact hospice in a.m.  Admission status: Inpatient IV Fluids: Normal saline Diet/Nutrition: Heart healthy Consults called: None DVT Px: SCDs and early ambulation.  Lovenox held secondary to thrombocytopenia and active bleeding Code Status: Full Code  Disposition Plan: To home in 1-2 days  All the records are reviewed and case discussed with ED provider. Management plans discussed with the patient and/or family who express understanding and agree with plan of care.  [employer] D.O. on 04/18/2023 at 8:55 PM CC: Primary care physician; [facility]   04/18/2023, 8:55 PM

## 2023-04-18 NOTE — ED Notes (Signed)
This RN called pt facility to verify code status. Pt is a full code. Oncoming RN notified

## 2023-04-19 DIAGNOSIS — D649 Anemia, unspecified: Secondary | ICD-10-CM | POA: Diagnosis not present

## 2023-04-19 LAB — TYPE AND SCREEN
ABO/RH(D): O POS
Antibody Screen: NEGATIVE
Unit division: 0

## 2023-04-19 LAB — BPAM RBC
Blood Product Expiration Date: 202406032359
ISSUE DATE / TIME: 202404302031
Unit Type and Rh: 5100

## 2023-04-19 LAB — HEMOGLOBIN AND HEMATOCRIT, BLOOD
HCT: 28.4 % — ABNORMAL LOW (ref 39.0–52.0)
Hemoglobin: 8.9 g/dL — ABNORMAL LOW (ref 13.0–17.0)

## 2023-04-19 LAB — COMPREHENSIVE METABOLIC PANEL
ALT: 8 U/L (ref 0–44)
AST: 11 U/L — ABNORMAL LOW (ref 15–41)
Albumin: 2.4 g/dL — ABNORMAL LOW (ref 3.5–5.0)
Alkaline Phosphatase: 62 U/L (ref 38–126)
Anion gap: 6 (ref 5–15)
BUN: 17 mg/dL (ref 8–23)
CO2: 26 mmol/L (ref 22–32)
Calcium: 8 mg/dL — ABNORMAL LOW (ref 8.9–10.3)
Chloride: 104 mmol/L (ref 98–111)
Creatinine, Ser: 1.15 mg/dL (ref 0.61–1.24)
GFR, Estimated: >60 mL/min (ref >60)
Glucose, Bld: 107 mg/dL — ABNORMAL HIGH (ref 70–99)
Potassium: 3.5 mmol/L (ref 3.5–5.1)
Sodium: 136 mmol/L (ref 135–145)
Total Bilirubin: 0.7 mg/dL (ref 0.3–1.2)
Total Protein: 6.5 g/dL (ref 6.5–8.1)

## 2023-04-19 LAB — CBC
HCT: 27.4 % — ABNORMAL LOW (ref 39.0–52.0)
Hemoglobin: 8.5 g/dL — ABNORMAL LOW (ref 13.0–17.0)
MCH: 23 pg — ABNORMAL LOW (ref 26.0–34.0)
MCHC: 31 g/dL (ref 30.0–36.0)
MCV: 74.3 fL — ABNORMAL LOW (ref 80.0–100.0)
Platelets: 112 10*3/uL — ABNORMAL LOW (ref 150–400)
RBC: 3.69 MIL/uL — ABNORMAL LOW (ref 4.22–5.81)
RDW: 23.5 % — ABNORMAL HIGH (ref 11.5–15.5)
WBC: 2.8 10*3/uL — ABNORMAL LOW (ref 4.0–10.5)
nRBC: 0 % (ref 0.0–0.2)

## 2023-04-19 LAB — VITAMIN B12: Vitamin B-12: 3909 pg/mL — ABNORMAL HIGH (ref 180–914)

## 2023-04-19 MED ORDER — HYDRALAZINE HCL 20 MG/ML IJ SOLN: 10.0000 mg | Freq: Four times a day (QID) | INTRAMUSCULAR | Status: DC | PRN

## 2023-04-19 MED ORDER — CEFDINIR 300 MG PO CAPS: 300.0000 mg | ORAL_CAPSULE | Freq: Two times a day (BID) | ORAL | 0 refills | Status: AC

## 2023-04-19 NOTE — Progress Notes (Signed)
[facility]) hospitalized hospice patient visit  Mr. Fassnacht is a current [facility] hospice patient with a terminal diagnosis of lung cancer. He has had several days of progressively worsening edema and shortness of breath and yesterday began having hematuria and decision was made that he be evaluated in the ED. [facility] was notified of this hospital transfer. He was admitted 4.30.24 with a diagnosis of gross hematuria and anemia. Per Dr. Patric Dykes with [facility] this is a related hospital admission.   Visited with patient at bedside. He reports to being uncomfortable due to pain in his hips from arthritis. Assisted him to readjust in bed which he reported did help. Patient reports that he is feeling some better today and is hopeful to possibly go home soon.  He is inpatient appropriate due to need for IVAB as well as blood transfusion.  Vital Signs- 99.3/65/19    172/72    99% room air Intake/Output- 775/500 Abnormal labs- Ca+ 8, Albumin 2.4, AST 11, WBC 2.8, RBC 3.69, Hgb 6.4>8.9, Hct 28.4, Platelets 112 Diagnostics-  CT abdomen/pelvis with no acute findings  IV/PRN Meds- NS @75cc /H, Rocephin 1 gram IV q24H, Hydralazine 5mg  Ivx2  Problem List-  Symptomatic anemia, microcytic in the setting of chronic pancytopenia - Admit inpatient - Transfuse 2 units packed red cells - Trend CBC -Check iron levels, FOBT, B12 and folate   #.  Gross hematuria likely secondary to urinary tract infection - Continue IV Rocephin - Continue IV fluids   #.  History of hypertension - Continue amlodipine, Coreg, Catapres, hydralazine   #. History of PTSD - Continue Paxil   #. History of BPH - Continue Flomax  Discharge Planning- Ongoing, likely next day or so Family Contact- None, Patient alert and oriented during visit this morning IDT- Updated Goals of care - Ongoing, patient remains full code Transfer summary and med list placed on patient's shadow chart.  Thea Gist, Charity fundraiser, BSN,  [facility] Liaison [phone]

## 2023-04-19 NOTE — Discharge Summary (Signed)
Physician Discharge Summary   Patient: Gregory Hurley MRN: [id number] DOB: Oct 07, 1946  Admit date:     04/18/2023  Discharge date: 04/19/23  Discharge Physician: Pennie Banter   PCP: [facility]   Recommendations at discharge:   Follow up with [facility] for ongoing hospice care Follow up with Primary Care in 1-2 weeks Repeat CBC, BMP in 1 week Follow up with Oncology as scheduled  Discharge Diagnoses: Principal Problem:   Symptomatic anemia Active Problems:   Gross hematuria  Resolved Problems:   * No resolved hospital problems. [facility] Course: HPI on admission 04/18/23 by Dr. Emmit Pomfret: "Gregory Hurley is a 77 y.o. male with a known history of lung cancer on hospice, CKD stage III, pancytopenia, hypertension, PTSD presents to the emergency department for evaluation of hematuria.  Patient was in a usual state of health until today when he reports the onset of bloody urine.  He denies any dysuria, frequency, fevers or chills.  His only other complaint is bilateral lower extremity edema which is new for him..   Patient denies fevers/chills, weakness, dizziness, chest pain, shortness of breath, N/V/C/D, abdominal pain, dysuria/frequency, changes in mental status.    Otherwise there has been no change in status. Patient has been taking medication as prescribed and there has been no recent change in medication or diet.  No recent antibiotics.  There has been no recent illness, hospitalizations, travel or sick contacts.     EMS/ED Course: Medical admission has been requested for further management of symptomatic anemia, hematuria likely secondary to urinary tract infection."   Further hospital course and management as outlined below.   04/18/23 -- pt doing well today, states he feels much better.  Hematuria has resolved. He requests discharge home today.  Advised urine cultures pending, he still wanted to go home, agreeable to take oral antibiotic and we will follow  culture results.   Discussed with hospice liaison on rounds.   Assessment and Plan:  #.  Symptomatic anemia, microcytic in the setting of chronic pancytopenia - Hbg on admission 6.4 - Transfused 2 units packed red cells -- Hbg improved to 8.5 this AM, repeat 8.9 this afternoon - Repeat CBC in 1 week   #.  Gross hematuria likely secondary to urinary tract infection - Treated with empiric IV Rocephin and IV fluids - Hematuria has resolved -- Discharge on PO Omnicef -- Follow urine culture results   #.  History of hypertension - Continue amlodipine, Coreg, Catapres, hydralazine   #. History of PTSD - Continue Paxil - Recently started on Cymbalta, continue   #. History of BPH - Continue Flomax   #. History of lung cancer -Seen by Hospice liaison this AM       Consultants: None Procedures performed: None  Disposition: Home Diet recommendation:  Regular diet DISCHARGE MEDICATION: Allergies as of 04/19/2023       Reactions   Ace Inhibitors    Other reaction(s): Angioedema        Medication List     STOP taking these medications    latanoprost 0.005 % ophthalmic solution Commonly known as: XALATAN   polyethylene glycol 17 g packet Commonly known as: MIRALAX / GLYCOLAX       TAKE these medications    acetaminophen 325 MG tablet Commonly known as: TYLENOL Take 1-2 tablets (325-650 mg total) by mouth every 4 (four) hours as needed for mild pain.   amLODipine 10 MG tablet Commonly known as: NORVASC Take 1 tablet (10  mg total) by mouth daily.   aspirin 81 MG chewable tablet Chew by mouth daily.   bisacodyl 10 MG suppository Commonly known as: DULCOLAX Place 1 suppository (10 mg total) rectally daily at 6 (six) AM.   carvedilol 3.125 MG tablet Commonly known as: COREG Take 1 tablet (3.125 mg total) by mouth 2 (two) times daily with a meal.   cefdinir 300 MG capsule Commonly known as: OMNICEF Take 1 capsule (300 mg total) by mouth 2 (two) times  daily for 4 days.   cloNIDine 0.2 MG tablet Commonly known as: CATAPRES Take 1 tablet (0.2 mg total) by mouth 2 (two) times daily.   cyanocobalamin 1000 MCG tablet Commonly known as: VITAMIN B12 Take 1,000 mcg by mouth daily.   docusate sodium 100 MG capsule Commonly known as: Colace Take 1 capsule (100 mg total) by mouth 2 (two) times daily.   DULoxetine 20 MG capsule Commonly known as: CYMBALTA Take 20 mg by mouth daily.   gabapentin 100 MG capsule Commonly known as: NEURONTIN Take 1 capsule (100 mg total) by mouth at bedtime. What changed: when to take this   hydrALAZINE 25 MG tablet Commonly known as: APRESOLINE Take 1 tablet (25 mg total) by mouth every 8 (eight) hours.   melatonin 3 MG Tabs tablet Take 2 tablets (6 mg total) by mouth at bedtime. IS OVER THE COUNTER. USED FOR SLEEP What changed: how much to take   nicotine polacrilex 2 MG gum Commonly known as: NICORETTE Take 2 mg by mouth as needed for smoking cessation.   PARoxetine 40 MG tablet Commonly known as: PAXIL Take 0.5 tablets (20 mg total) by mouth daily.   tamsulosin 0.4 MG Caps capsule Commonly known as: FLOMAX Take 0.4 mg by mouth.        Discharge Exam: Filed Weights   04/18/23 1445  Weight: 65.8 kg   General exam: awake, alert, no acute distress HEENT: moist mucus membranes, hearing grossly normal  Respiratory system: CTAB diminished bases, no wheezes, rales or rhonchi, normal respiratory effort. Cardiovascular system: normal S1/S2, RRR, pitting LE edema.   Gastrointestinal system: soft, NT, ND, no HSM felt, +bowel sounds. Genitourinary system: light amber urine in wall canister, no gross hematuria Central nervous system: A&O x3. no gross focal neurologic deficits, normal speech Skin: dry, intact, normal temperature Psychiatry: normal mood, congruent affect, judgement and insight appear normal   Condition at discharge: stable  The results of significant diagnostics from this  hospitalization (including imaging, microbiology, ancillary and laboratory) are listed below for reference.   Imaging Studies: CT ABDOMEN PELVIS WO CONTRAST  Result Date: 04/18/2023 CLINICAL DATA:  Hematuria, history of lung carcinoma EXAM: CT ABDOMEN AND PELVIS WITHOUT CONTRAST TECHNIQUE: Multidetector CT imaging of the abdomen and pelvis was performed following the standard protocol without IV contrast. RADIATION DOSE REDUCTION: This exam was performed according to the departmental dose-optimization program which includes automated exposure control, adjustment of the mA and/or kV according to patient size and/or use of iterative reconstruction technique. COMPARISON:  06/30/2020 FINDINGS: Lower chest: Loculated right pleural effusion with pleural thickening. Postop changes and parenchymal scarring/consolidation in the right hilum and lateral right lower lung. Pulmonary emphysema. Blood pool is hypodense compared to the interventricular septum suggesting anemia. Hepatobiliary: No focal liver abnormality is seen. No gallstones, gallbladder wall thickening, or biliary dilatation. Pancreas: Unremarkable. No pancreatic ductal dilatation or surrounding inflammatory changes. Spleen: Normal in size without focal abnormality. Adrenals/Urinary Tract: No adrenal mass. Symmetric renal contours. No urolithiasis or hydronephrosis. Urinary  bladder incompletely distended with circumferential wall thickening. Stomach/Bowel: Stomach is nondistended, unremarkable. Small bowel decompressed. Moderate colonic fecal material without dilatation. Vascular/Lymphatic: Scattered aortoiliac calcified plaque without aneurysm. No abdominal or pelvic adenopathy. Reproductive: Prostate is unremarkable. Other: Small volume pelvic ascites. No abdominal ascites. No free air. Musculoskeletal: Flowing osteophytes through the thoracolumbar spine. AVN in bilateral femoral heads without subchondral collapse. No acute findings. IMPRESSION: 1. No acute  findings. No urolithiasis or hydronephrosis. 2. Loculated right pleural effusion with pleural thickening. 3. Small volume pelvic ascites. 4. Aortic Atherosclerosis (ICD10-I70.0) and Emphysema (ICD10-J43.9). Electronically Signed   By: Corlis Leak M.D.   On: 04/18/2023 17:36    Microbiology: Results for orders placed or performed during the hospital encounter of 12/08/21  Resp Panel by RT-PCR (Flu A&B, Covid) Nasopharyngeal Swab     Status: None   Collection Time: 12/08/21 11:57 AM   Specimen: Nasopharyngeal Swab; Nasopharyngeal(NP) swabs in vial transport medium  Result Value Ref Range Status   SARS Coronavirus 2 by RT PCR NEGATIVE NEGATIVE Final    Comment: (NOTE) SARS-CoV-2 target nucleic acids are NOT DETECTED.  The SARS-CoV-2 RNA is generally detectable in upper respiratory specimens during the acute phase of infection. The lowest concentration of SARS-CoV-2 viral copies this assay can detect is 138 copies/mL. A negative result does not preclude SARS-Cov-2 infection and should not be used as the sole basis for treatment or other patient management decisions. A negative result may occur with  improper specimen collection/handling, submission of specimen other than nasopharyngeal swab, presence of viral mutation(s) within the areas targeted by this assay, and inadequate number of viral copies(<138 copies/mL). A negative result must be combined with clinical observations, patient history, and epidemiological information. The expected result is Negative.  Fact Sheet for Patients:  BloggerCourse.com  Fact Sheet for Healthcare Providers:  SeriousBroker.it  This test is no t yet approved or cleared by the [place] FDA and  has been authorized for detection and/or diagnosis of SARS-CoV-2 by FDA under an Emergency Use Authorization (EUA). This EUA will remain  in effect (meaning this test can be used) for the duration of the COVID-19  declaration under Section 564(b)(1) of the Act, 21 U.S.C.section 360bbb-3(b)(1), unless the authorization is terminated  or revoked sooner.       Influenza A by PCR NEGATIVE NEGATIVE Final   Influenza B by PCR NEGATIVE NEGATIVE Final    Comment: (NOTE) The Xpert Xpress SARS-CoV-2/FLU/RSV plus assay is intended as an aid in the diagnosis of influenza from Nasopharyngeal swab specimens and should not be used as a sole basis for treatment. Nasal washings and aspirates are unacceptable for Xpert Xpress SARS-CoV-2/FLU/RSV testing.  Fact Sheet for Patients: BloggerCourse.com  Fact Sheet for Healthcare Providers: SeriousBroker.it  This test is not yet approved or cleared by the [place] FDA and has been authorized for detection and/or diagnosis of SARS-CoV-2 by FDA under an Emergency Use Authorization (EUA). This EUA will remain in effect (meaning this test can be used) for the duration of the COVID-19 declaration under Section 564(b)(1) of the Act, 21 U.S.C. section 360bbb-3(b)(1), unless the authorization is terminated or revoked.  Performed at [facility], [address] [place], [place] [address]     Labs: CBC: Recent Labs  Lab 04/18/23 1527 04/19/23 0309 04/19/23 1147  WBC 3.2* 2.8*  --   HGB 6.4* 8.5* 8.9*  HCT 21.1* 27.4* 28.4*  MCV 72.5* 74.3*  --   PLT 118* 112*  --    Basic Metabolic  Panel: Recent Labs  Lab 04/18/23 1527 04/19/23 0309  NA 136 136  K 3.4* 3.5  CL 104 104  CO2 25 26  GLUCOSE 107* 107*  BUN 18 17  CREATININE 1.16 1.15  CALCIUM 7.9* 8.0*   Liver Function Tests: Recent Labs  Lab 04/19/23 0309  AST 11*  ALT 8  ALKPHOS 62  BILITOT 0.7  PROT 6.5  ALBUMIN 2.4*   CBG: No results for input(s): "GLUCAP" in the last 168 hours.  Discharge time spent: greater than 30 minutes.  Signed: Pennie Banter, DO [facility] 04/19/2023

## 2023-04-19 NOTE — Progress Notes (Signed)
Discharged. AVS printed and reviewed. All belonging gathered. Daughter to transport home via car.

## 2023-04-19 NOTE — TOC Progression Note (Signed)
Transition of Care [facility]) - Progression Note    Patient Details  Name: Gregory Hurley MRN: [id number] Date of Birth: [birth year]  [facility] CM/SW Contact  Marlowe Sax, RN Phone Number: 04/19/2023, 1:41 PM  Clinical Narrative:    The patient  is a current hospice patient with [facility]   Will continue with Hopsice at [facility] at home \ Expected Discharge Plan: Home w Hospice Care Barriers to Discharge: No Barriers Identified  Expected Discharge Plan and Services   Discharge Planning Services: CM Consult   Living arrangements for the past 2 months: Single Family Home Expected Discharge Date: 04/19/23                                     Social Determinants of Health (SDOH) Interventions SDOH Screenings   Food Insecurity: No Food Insecurity (04/19/2023)  Housing: Low Risk  (04/19/2023)  Transportation Needs: No Transportation Needs (04/19/2023)  Utilities: Not At Risk (04/19/2023)  Depression (PHQ2-9): High Risk (04/27/2022)  Tobacco Use: Medium Risk (04/18/2023)    Readmission Risk Interventions     No data to display

## 2023-04-20 LAB — URINE CULTURE

## 2023-04-21 LAB — URINE CULTURE: Culture: >=100000 — AB

## 2023-10-20 DEATH — deceased
